# Patient Record
Sex: Female | Born: 1980
Health system: Southern US, Community
[De-identification: ages and names within clinical notes are randomized; demographics above are authoritative.]

## PROBLEM LIST (undated history)

## (undated) DIAGNOSIS — G43909 Migraine, unspecified, not intractable, without status migrainosus: Secondary | ICD-10-CM

## (undated) DIAGNOSIS — R87619 Unspecified abnormal cytological findings in specimens from cervix uteri: Secondary | ICD-10-CM

## (undated) DIAGNOSIS — R12 Heartburn: Secondary | ICD-10-CM

## (undated) HISTORY — DX: Migraine, unspecified, not intractable, without status migrainosus: G43.909

## (undated) HISTORY — DX: Heartburn: R12

## (undated) HISTORY — PX: TUBAL LIGATION: SHX77

## (undated) HISTORY — DX: Unspecified abnormal cytological findings in specimens from cervix uteri: R87.619

## (undated) HISTORY — PX: WISDOM TOOTH EXTRACTION: SHX21

---
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
## 2007-11-02 LAB — CONVERTED CEMR LAB

## 2008-05-06 ENCOUNTER — Ambulatory Visit: Payer: Self-pay | Admitting: Family Medicine

## 2008-05-06 DIAGNOSIS — G43009 Migraine without aura, not intractable, without status migrainosus: Secondary | ICD-10-CM | POA: Insufficient documentation

## 2008-05-09 ENCOUNTER — Ambulatory Visit: Payer: Self-pay | Admitting: Family Medicine

## 2008-05-12 ENCOUNTER — Telehealth: Payer: Self-pay | Admitting: Family Medicine

## 2008-06-07 ENCOUNTER — Ambulatory Visit: Payer: Self-pay | Admitting: Family Medicine

## 2008-06-13 ENCOUNTER — Encounter: Payer: Self-pay | Admitting: Family Medicine

## 2008-07-08 ENCOUNTER — Ambulatory Visit: Payer: Self-pay | Admitting: Family Medicine

## 2008-08-18 ENCOUNTER — Encounter: Payer: Self-pay | Admitting: Family Medicine

## 2008-08-25 ENCOUNTER — Telehealth: Payer: Self-pay | Admitting: Family Medicine

## 2008-09-19 ENCOUNTER — Encounter: Payer: Self-pay | Admitting: Family Medicine

## 2008-12-08 ENCOUNTER — Encounter: Payer: Self-pay | Admitting: Family Medicine

## 2009-05-02 ENCOUNTER — Ambulatory Visit: Payer: Self-pay | Admitting: Family Medicine

## 2009-06-08 ENCOUNTER — Encounter: Payer: Self-pay | Admitting: Family Medicine

## 2009-09-11 ENCOUNTER — Encounter: Payer: Self-pay | Admitting: Family Medicine

## 2009-09-12 ENCOUNTER — Ambulatory Visit: Payer: Self-pay | Admitting: Obstetrics & Gynecology

## 2009-09-12 ENCOUNTER — Encounter: Payer: Self-pay | Admitting: Obstetrics & Gynecology

## 2009-10-12 ENCOUNTER — Ambulatory Visit: Payer: Self-pay | Admitting: Family Medicine

## 2009-10-12 DIAGNOSIS — J309 Allergic rhinitis, unspecified: Secondary | ICD-10-CM | POA: Insufficient documentation

## 2009-11-21 ENCOUNTER — Ambulatory Visit: Payer: Self-pay | Admitting: Family Medicine

## 2009-11-21 DIAGNOSIS — H8110 Benign paroxysmal vertigo, unspecified ear: Secondary | ICD-10-CM | POA: Insufficient documentation

## 2009-12-05 ENCOUNTER — Encounter: Payer: Self-pay | Admitting: Family Medicine

## 2010-04-06 ENCOUNTER — Ambulatory Visit: Payer: Self-pay | Admitting: Family Medicine

## 2010-04-06 DIAGNOSIS — R635 Abnormal weight gain: Secondary | ICD-10-CM | POA: Insufficient documentation

## 2010-04-06 DIAGNOSIS — Z8719 Personal history of other diseases of the digestive system: Secondary | ICD-10-CM | POA: Insufficient documentation

## 2010-04-10 ENCOUNTER — Encounter: Payer: Self-pay | Admitting: Family Medicine

## 2010-04-11 LAB — CONVERTED CEMR LAB
ALT: 21 units/L (ref 0–35)
AST: 15 units/L (ref 0–37)
Albumin: 4.3 g/dL (ref 3.5–5.2)
BUN: 16 mg/dL (ref 6–23)
Bilirubin Urine: NEGATIVE
CO2: 22 meq/L (ref 19–32)
Calcium: 9.2 mg/dL (ref 8.4–10.5)
Chloride: 104 meq/L (ref 96–112)
Creatinine, Ser: 0.72 mg/dL (ref 0.40–1.20)
HCT: 41.8 % (ref 36.0–46.0)
Hemoglobin, Urine: NEGATIVE
Hemoglobin: 13.6 g/dL (ref 12.0–15.0)
Platelets: 310 10*3/uL (ref 150–400)
Potassium: 4.4 meq/L (ref 3.5–5.3)
Protein, ur: NEGATIVE mg/dL
RDW: 12.6 % (ref 11.5–15.5)
TSH: 1.308 microintl units/mL (ref 0.350–4.500)
Urine Glucose: NEGATIVE mg/dL
Urobilinogen, UA: 0.2 (ref 0.0–1.0)
WBC: 6 10*3/uL (ref 4.0–10.5)

## 2010-05-21 ENCOUNTER — Encounter: Payer: Self-pay | Admitting: Family Medicine

## 2010-05-24 ENCOUNTER — Encounter: Payer: Self-pay | Admitting: Family Medicine

## 2010-09-19 ENCOUNTER — Ambulatory Visit: Payer: Self-pay | Admitting: Obstetrics & Gynecology

## 2010-10-17 ENCOUNTER — Encounter: Payer: Self-pay | Admitting: Family Medicine

## 2010-12-13 ENCOUNTER — Ambulatory Visit
Admission: RE | Admit: 2010-12-13 | Discharge: 2010-12-13 | Payer: Self-pay | Source: Home / Self Care | Attending: Family Medicine | Admitting: Family Medicine

## 2010-12-13 ENCOUNTER — Encounter: Payer: Self-pay | Admitting: Family Medicine

## 2010-12-13 LAB — CONVERTED CEMR LAB
BUN: 10 mg/dL (ref 6–23)
CO2: 28 meq/L (ref 19–32)
Chloride: 104 meq/L (ref 96–112)
Glucose, Bld: 77 mg/dL (ref 70–99)
HCT: 42.3 % (ref 36.0–46.0)
Lymphs Abs: 2 10*3/uL (ref 0.7–4.0)
MCHC: 34.2 g/dL (ref 30.0–36.0)
MCV: 86.6 fL (ref 78.0–100.0)
Neutrophils Relative %: 66 % (ref 43–77)
Platelets: 273 10*3/uL (ref 150–400)
Potassium: 4.2 meq/L (ref 3.5–5.3)
RDW: 12.2 % (ref 11.5–15.5)
Sodium: 136 meq/L (ref 135–145)

## 2011-01-01 NOTE — Consult Note (Signed)
Summary: [facility]  [facility]   Imported By: Lanelle Bal 07/27/2010 12:13:41  _____________________________________________________________________  External Attachment:    Type:   Image     Comment:   External Document

## 2011-01-01 NOTE — Letter (Signed)
Summary: [facility]  [facility]   Imported By: Lanelle Bal 06/21/2010 09:52:50  _____________________________________________________________________  External Attachment:    Type:   Image     Comment:   External Document

## 2011-01-01 NOTE — Assessment & Plan Note (Signed)
Summary: WEIGHT GAIN AND GERD   Vital Signs:  Patient profile:   30 year old female Height:      65.5 inches Weight:      168 pounds Pulse rate:   91 / minute BP sitting:   110 / 66  (left arm) Cuff size:   regular  Vitals Entered By: Kathlene November (Apr 06, 2010 10:29 AM) CC: Abdomian bloating, weight gain   Primary Care Provider:  Nani Gasser MD  CC:  Abdomian bloating and weight gain.  History of Present Illness: Has gaine about 8 lbs in a realy short period.  Has been working out 5 days a week and hour each day.  Stomach feels uncomfrtable, heavy, gassy and bloated. Feels BM are irregular. Periods have been regular. No family hx of thyroid problems. No CP or SOB or swelling.  Has noticed her rings are tighter.  Has had more reflux lately. No new medications.Taking in 1300-1400 caloires for the last 6 weeks and has only lost 1/2 a lb.  NO fever. No fatigue.    Current Medications (verified): 1)  Yaz 3-0.02 Mg  Tabs (Drospirenone-Ethinyl Estradiol) .... Take One Tablet By Mouth Once A Day 2)  Treximet 85-500 Mg  Tabs (Sumatriptan-Naproxen Sodium) .Marland Kitchen.. 1 By Mouth Once Daily As Needed Migraines. Can Repeat Dose in 2 Hours If Still Have Ha 3)  Amitriptyline Hcl 25 Mg  Tabs (Amitriptyline Hcl) .... Take 1 Tablet By Mouth Once A Day At Bedtime 4)  Singulair 10 Mg Tabs (Montelukast Sodium) .... Take One Tablet By Mouth At Bedtime 5)  Allegra-D 24 Hour 180-240 Mg Xr24h-Tab (Fexofenadine-Pseudoephedrine) .... Take 1 Tablet By Mouth Once A Day in The Am. 6)  Fluticasone Propionate 50 Mcg/act Susp (Fluticasone Propionate) .... 2 Sprays in Each Nostrils Each Day  Allergies (verified): No Known Drug Allergies  Comments:  Nurse/Medical Assistant: The patient's medications and allergies were reviewed with the patient and were updated in the Medication and Allergy Lists. Kathlene November (Apr 06, 2010 10:29 AM)  Physical Exam  General:  Well-developed,well-nourished,in no acute distress;  alert,appropriate and cooperative throughout examination Head:  Normocephalic and atraumatic without obvious abnormalities. No apparent alopecia or balding. Eyes:  No corneal or conjunctival inflammation noted. EOMI. Perrla.  Neck:  No deformities, masses, or tenderness noted. No TM.  Lungs:  Normal respiratory effort, chest expands symmetrically. Lungs are clear to auscultation, no crackles or wheezes. Heart:  Normal rate and regular rhythm. S1 and S2 normal without gallop, murmur, click, rub or other extra sounds. Abdomen:  Bowel sounds positive,abdomen soft and non-tender without masses, organomegaly or hernias noted. Pulses:  Radial 2+ bilat  Extremities:  NO LE edema.  Skin:  no rashes.   Cervical Nodes:  No lymphadenopathy noted Psych:  Cognition and judgment appear intact. Alert and cooperative with normal attention span and concentration. No apparent delusions, illusions, hallucinations   Impression & Recommendations:  Problem # 1:  WEIGHT GAIN (ICD-783.1)  BAsed on her diet and exercise regimen I would really expect her to be able to lose weight and she has only lost hafl a pound.  I think we should check her thyroid gland. Her BP looks graet today. She has been on low dose amitryptiline for a long time so I really think this is a less likelhy cuase of her weight gain. Will screen for diabetes.  Her OCP shouldn't aldo be causing her sig weight gain either.  If all normal labs then will refer to nutrition  for counseling.   Orders: T-Comprehensive Metabolic Panel [id number]) T-TSH [id number]) T-CBC No Diff ([id number]) T-Urinalysis (81003-65000) T-Cortisol, AM ([id number])  Problem # 2:  GASTROESOPHAGEAL REFLUX DISEASE, MILD, HX OF (ICD-V12.79) Not sure if related to the caues of the weight gain or the weight gain itself is causing the GI sxs.  Samlies of PPI given for one week. If helping will tx for 6-8 weeks.   Complete Medication List: 1)  Yaz 3-0.02 Mg Tabs  (Drospirenone-ethinyl estradiol) .... Take one tablet by mouth once a day 2)  Treximet 85-500 Mg Tabs (Sumatriptan-naproxen sodium) .Marland Kitchen.. 1 by mouth once daily as needed migraines. can repeat dose in 2 hours if still have ha 3)  Amitriptyline Hcl 25 Mg Tabs (Amitriptyline hcl) .... Take 1 tablet by mouth once a day at bedtime 4)  Singulair 10 Mg Tabs (Montelukast sodium) .... Take one tablet by mouth at bedtime 5)  Allegra-d 24 Hour 180-240 Mg Xr24h-tab (Fexofenadine-pseudoephedrine) .... Take 1 tablet by mouth once a day in the am. 6)  Fluticasone Propionate 50 Mcg/act Susp (Fluticasone propionate) .... 2 sprays in each nostrils each day 7)  Nadolol 40 Mg Tabs (Nadolol) .... 2 at bedtime for migraine  Patient Instructions: 1)  We will call you with your lab results.  If all normal then I will refer you to a nutritionist and we could consider weaning off the amitryptiline.

## 2011-01-01 NOTE — Letter (Signed)
Summary: [facility]  [facility]   Imported By: Lanelle Bal 12/21/2009 11:37:01  _____________________________________________________________________  External Attachment:    Type:   Image     Comment:   External Document

## 2011-01-01 NOTE — Letter (Signed)
Summary: [facility]   Imported By: Maryln Gottron 11/08/2010 15:51:15  _____________________________________________________________________  External Attachment:    Type:   Image     Comment:   External Document

## 2011-01-03 NOTE — Assessment & Plan Note (Signed)
Summary: Vertigo   Vital Signs:  Patient profile:   30 year old female Height:      65.5 inches Weight:      180 pounds Pulse rate:   61 / minute BP sitting:   124 / 80  (right arm) Cuff size:   regular  Vitals Entered By: Avon Gully CMA, Duncan Dull) (December 13, 2010 10:16 AM) CC: vertigo   Primary Care Provider:  Nani Gasser MD  CC:  vertigo.  History of Present Illness: Flew at the end of Thanksgiign and started then. Feels a little off balance with movement.  Sometimes more intense and feels things are spinning and feels nauseated with it.  Last 2 days have been worse. Felt fatigued.  No fever.  No ear pain or pressure or hearing loss.  Had similar sxs when went on a cruise last year but only lasted 10 days. Stopped taking her OCPs in October.   This is on PNV.   Current Medications (verified): 1)  Treximet 85-500 Mg  Tabs (Sumatriptan-Naproxen Sodium) .Marland Kitchen.. 1 By Mouth Once Daily As Needed Migraines. Can Repeat Dose in 2 Hours If Still Have Ha 2)  Amitriptyline Hcl 25 Mg  Tabs (Amitriptyline Hcl) .... Take 1 Tablet By Mouth Once A Day At Bedtime 3)  Singulair 10 Mg Tabs (Montelukast Sodium) .... Take One Tablet By Mouth At Bedtime 4)  Allegra-D 24 Hour 180-240 Mg Xr24h-Tab (Fexofenadine-Pseudoephedrine) .... Take 1 Tablet By Mouth Once A Day in The Am. 5)  Fluticasone Propionate 50 Mcg/act Susp (Fluticasone Propionate) .... 2 Sprays in Each Nostrils Each Day 6)  Nadolol 40 Mg Tabs (Nadolol) .... 2 At Bedtime For Migraine  Allergies (verified): No Known Drug Allergies  Comments:  Nurse/Medical Assistant: The patient's medications and allergies were reviewed with the patient and were updated in the Medication and Allergy Lists. Avon Gully CMA, Duncan Dull) (December 13, 2010 10:17 AM)  Physical Exam  General:  Well-developed,well-nourished,in no acute distress; alert,appropriate and cooperative throughout examination Head:  Normocephalic and atraumatic without  obvious abnormalities. No apparent alopecia or balding. Eyes:  No corneal or conjunctival inflammation noted. EOMI. Perrla. Ears:  External ear exam shows no significant lesions or deformities.  Otoscopic examination reveals clear canals, tympanic membranes are intact bilaterally without bulging, retraction, inflammation or discharge. Hearing is grossly normal bilaterally. Mouth:  Tonsils with mild hypertrophy.  Neck:  No deformities, masses, or tenderness noted. Lungs:  Normal respiratory effort, chest expands symmetrically. Lungs are clear to auscultation, no crackles or wheezes. Heart:  Normal rate and regular rhythm. S1 and S2 normal without gallop, murmur, click, rub or other extra sounds. Neurologic:  alert & oriented X3, cranial nerves II-XII intact, and gait normal.  + diks hallpike to the left.  Skin:  no rashes.   Psych:  Cognition and judgment appear intact. Alert and cooperative with normal attention span and concentration. No apparent delusions, illusions, hallucinations   Impression & Recommendations:  Problem # 1:  BENIGN POSITIONAL VERTIGO (ICD-386.11)  Orders: T-CBC w/Diff ([id number]) T-Basic Metabolic Panel ([id number])  Demonstrated maneuvers to self-treat vertigo. Patient to call to be seen if no improvement in 10-14 days, sooner if worse. Given H.O on fruther infomraiton. Can use meclizine if would like.   Complete Medication List: 1)  Treximet 85-500 Mg Tabs (Sumatriptan-naproxen sodium) .Marland Kitchen.. 1 by mouth once daily as needed migraines. can repeat dose in 2 hours if still have ha 2)  Amitriptyline Hcl 25 Mg Tabs (Amitriptyline hcl) .... Take 1 tablet by mouth  once a day at bedtime 3)  Singulair 10 Mg Tabs (Montelukast sodium) .... Take one tablet by mouth at bedtime 4)  Allegra-d 24 Hour 180-240 Mg Xr24h-tab (Fexofenadine-pseudoephedrine) .... Take 1 tablet by mouth once a day in the am. 5)  Fluticasone Propionate 50 Mcg/act Susp (Fluticasone propionate) .... 2  sprays in each nostrils each day 6)  Nadolol 40 Mg Tabs (Nadolol) .... 2 at bedtime for migraine  Patient Instructions: 1)  Call if not better in 2-3 weeks. Can take the over the counter meclizine for dizziness if you want to.    Orders Added: 1)  T-CBC w/Diff [[id number]] 2)  T-Basic Metabolic Panel [[id number]] 3)  Est. Patient Level III [14782]

## 2011-04-16 NOTE — Assessment & Plan Note (Signed)
NAMEOSIRIS, CHARLES               ACCOUNT NO.:  [id number]   MEDICAL RECORD NO.:  [id number]          PATIENT TYPE:  POB   LOCATION:  [facility] [place]         FACILITY:  [facility]   PHYSICIAN:  Allie Bossier, MD        DATE OF BIRTH:  29-Dec-1980   DATE OF SERVICE:  09/19/2010                                  CLINIC NOTE   HISTORY:  Mallorey is a 30 year old married white, gravida 0, she comes in  here for annual exam.  She and her husband would like to start planning  a pregnancy.  In an effort to do this she has started taking prenatal  vitamins about 6 weeks ago.  She had some questions about Treximet use  during pregnancy.  I explained to her it is a category C and deemed  implications of that.  I told her I would be happy to write her  prescription for that if she needs it, if her neurologist prefers that I  write the prescriptions.  She plans to discontinue her birth control.   PAST MEDICAL HISTORY:  Menstrual migraines.   PAST SURGICAL HISTORY:  She had a LEEP in 2002, wisdom teeth extraction.   REVIEW OF SYSTEMS:  She is a homemaker.  She is married for about 3  years.  She denies dyspareunia.  She is status post Gardasil  vaccination.  She would like her flu shot this year today.   MEDICATIONS:  1. Singulair 10 mg daily.  2. Corgard 40 mg.  3. Cyclobenzaprine 10 mg.  4. Treximet p.r.n. (She only have to use this during menses).  5. Levora OCPs.  6. Claritin-D p.r.n.  7. Ibuprofen p.r.n. (rare use).  8. Prenatal vitamins daily.   ALLERGIES:  No known drug allergies.  No latex allergies.   FAMILY HISTORY:  Negative for breast, GYN, and colon malignancies.   PHYSICAL EXAMINATION:  GENERAL:  Well-nourished, well-hydrated white  female, height 5 feet 6 inches, weight 174, blood pressure 134/88, pulse  124.  HEENT:  Normal.  HEART:  Regular rate and rhythm.  LUNGS:  Clear to auscultation bilaterally.  BREASTS:  Normal bilaterally.  ABDOMEN:  Overweight.  No palpable  hepatosplenomegaly.  EXTERNAL GENITALIA:  No lesions.  Cervix nulliparous, normal, status  post LEEP, normal discharge.  Uterus normal size, shape,  anteverted,  mobile.  Adnexa nontender.  No masses.  She has adequate pelvis for a  normal size baby.   ASSESSMENT AND PLAN:  Annual exam, I checked Pap smear with cervical  cultures, given her flu vaccine, and I recommended self-breast and self-  vulvar exams.  She wishes to go ahead and stop her birth control pills.  I have explained at an average, the couple takes approximately a year to  achieve pregnancy.  She will come back when she is pregnant or in a  year.      Allie Bossier, MD     MCD/MEDQ  D:  09/19/2010  T:  09/19/2010  Job:  [id number]

## 2011-04-16 NOTE — Assessment & Plan Note (Signed)
Lisa, Cannon               ACCOUNT NO.:  [id number]   MEDICAL RECORD NO.:  [id number]          PATIENT TYPE:  POB   LOCATION:  [facility] [place]         FACILITY:  [facility]   PHYSICIAN:  Allie Bossier, MD        DATE OF BIRTH:  16-Apr-1981   DATE OF SERVICE:  09/12/2009                                  CLINIC NOTE   HISTORY OF PRESENT ILLNESS:  Lisa Cannon is a 30 year old married white  gravida 0.  She comes in here for her first annual exam at this office.  Her only complaint today is that of breakthrough bleeding on her generic  birth control pill called Levora; this is apparently the generic for  Seasonale and for 3 weeks preceding her inactive pills, she has  bleeding.   PAST MEDICAL HISTORY:  Migraines.   PAST SURGICAL HISTORY:  She had a LEEP in approximately the year 2002  and she has had a wisdom tooth extraction.  She had no problems with  general anesthesia for that procedure.   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  She denies dyspareunia.  Last Pap smear was in 2009  and was normal.  She is status post the Gardasil vaccination series in  2005 and her flu vaccination this month.   MEDICATIONS:  1. Singulair 10 mg daily.  2. Corgard 40 mg daily.  3. Cyclobenzaprine 10 mg daily.  4. Treximet as necessary.  5. Levora OCPs daily.  6. Claritin D p.r.n. as she barely takes an ibuprofen.   ALLERGIES:  No known drug allergies.  No latex allergies.   FAMILY HISTORY:  Negative for breast, GYN, and colon malignancies.   PHYSICAL EXAMINATION:  VITAL SIGNS:  Weight 164 pounds, height 5 feet 6  inches, blood pressure 119/71, pulse 86.  HEENT:  Normal.  HEART:  Regular rate and rhythm.  BREAST:  Normal bilaterally.  ABDOMEN:  Benign.  No palpable hepatosplenomegaly.  EXTERNAL GENITALIA:  Shaved.  No lesions.  Cervix nulliparous.  Normal  status post LEEP procedure.  Pap smear was obtained.  Bimanual exam,  uterus is  normal sized and shape, anteverted.  Adnexa is nontender.  No  masses.   ASSESSMENT AND PLAN:  1. Annual exam.  I have checked Pap smear.  Recommended self breast      and self vulvar exams monthly.  2. Breakthrough bleeding on Levora.  I have given her samples of Yas      birth control pills and told her to take them continuously.  Please      note that she does to continue with birth control pills because she      has worsening and more frequent migraines with her periods.  I have      given her a prescription as well.  She will call me if the Yas does      not suit her and if it does not suit her, I will call in another      birth control pill.      Allie Bossier, MD     MCD/MEDQ  D:  09/12/2009  T:  09/13/2009  Job:  [id number]

## 2011-05-10 ENCOUNTER — Encounter: Payer: Self-pay | Admitting: Family Medicine

## 2011-06-17 ENCOUNTER — Other Ambulatory Visit: Payer: Self-pay | Admitting: Family Medicine

## 2011-08-28 ENCOUNTER — Encounter: Payer: Self-pay | Admitting: Obstetrics & Gynecology

## 2011-08-28 ENCOUNTER — Ambulatory Visit (INDEPENDENT_AMBULATORY_CARE_PROVIDER_SITE_OTHER): Payer: 59 | Admitting: Obstetrics & Gynecology

## 2011-08-28 VITALS — BP 120/78 | HR 81 | Temp 98.3°F | Resp 16 | Ht 66.0 in | Wt 186.0 lb

## 2011-08-28 DIAGNOSIS — E348 Other specified endocrine disorders: Secondary | ICD-10-CM

## 2011-08-28 DIAGNOSIS — E669 Obesity, unspecified: Secondary | ICD-10-CM

## 2011-08-28 DIAGNOSIS — N912 Amenorrhea, unspecified: Secondary | ICD-10-CM

## 2011-08-28 NOTE — Progress Notes (Signed)
  Subjective:    Patient ID: Lisa Cannon, female    DOB: 1981-04-20, 30 y.o.   MRN: [id number]  HPI  Lisa Cannon is a 30 yo MWG0 who has been off her OCP for 1 year, trying to get pregnant.  She had monthly periods (3 days and light) for 8 months and then no period for last 3 months. Home UPTs and one here today are negative.  She has gained 12 pounds since last year without changing her lifestyle. She complains of new onset facial hair growth and darkening and coarsening of hair on her legs.  Review of Systems TSH was normal 5/11 Pap due next month   Denies hot flashes Objective:   Physical Exam        Assessment & Plan:  Amenorrhea and weight gain Check fasting lipids, TSH, prolactin, FSH

## 2011-08-29 LAB — TESTOSTERONE, FREE, TOTAL, SHBG
Testosterone, Free: 21.1 pg/mL — ABNORMAL HIGH (ref 0.6–6.8)
Testosterone-% Free: 2.5 % — ABNORMAL HIGH (ref 0.4–2.4)
Testosterone: 84.76 ng/dL — ABNORMAL HIGH (ref 10–70)

## 2011-08-29 LAB — TSH: TSH: 1.561 u[IU]/mL (ref 0.350–4.500)

## 2011-08-29 LAB — LIPID PANEL
HDL: 40 mg/dL (ref >39)
LDL Cholesterol: 123 mg/dL — ABNORMAL HIGH (ref 0–99)

## 2011-08-29 LAB — FOLLICLE STIMULATING HORMONE: FSH: 5.4 m[IU]/mL

## 2011-08-29 LAB — PROLACTIN: Prolactin: 8.7 ng/mL

## 2011-09-17 ENCOUNTER — Ambulatory Visit (INDEPENDENT_AMBULATORY_CARE_PROVIDER_SITE_OTHER): Payer: 59 | Admitting: Obstetrics & Gynecology

## 2011-09-17 ENCOUNTER — Encounter: Payer: Self-pay | Admitting: Obstetrics & Gynecology

## 2011-09-17 VITALS — BP 109/72 | HR 74 | Wt 186.0 lb

## 2011-09-17 DIAGNOSIS — N970 Female infertility associated with anovulation: Secondary | ICD-10-CM

## 2011-09-17 DIAGNOSIS — Z1272 Encounter for screening for malignant neoplasm of vagina: Secondary | ICD-10-CM

## 2011-09-17 DIAGNOSIS — Z01419 Encounter for gynecological examination (general) (routine) without abnormal findings: Secondary | ICD-10-CM

## 2011-09-17 DIAGNOSIS — E282 Polycystic ovarian syndrome: Secondary | ICD-10-CM

## 2011-09-17 DIAGNOSIS — Z23 Encounter for immunization: Secondary | ICD-10-CM

## 2011-09-17 DIAGNOSIS — Z113 Encounter for screening for infections with a predominantly sexual mode of transmission: Secondary | ICD-10-CM

## 2011-09-17 DIAGNOSIS — Z0000 Encounter for general adult medical examination without abnormal findings: Secondary | ICD-10-CM

## 2011-09-17 MED ORDER — CLOMIPHENE CITRATE 50 MG PO TABS: ORAL_TABLET | ORAL | Status: DC

## 2011-09-17 MED ORDER — MEDROXYPROGESTERONE ACETATE 10 MG PO TABS: ORAL_TABLET | ORAL | Status: DC

## 2011-09-17 NOTE — Progress Notes (Signed)
  Subjective:    Patient ID: Lisa Cannon, female    DOB: [birth year], 30 y.o.   MRN: [id number]  HPI    Review of Systems     Objective:   Physical Exam        Assessment & Plan:   Subjective:    Lisa Cannon is a 30 y.o. female who presents for an annual exam. She is also here to discuss her labs from last month.She is trying to conceive, unprotected sex for 1 year. She has not had a period since June. The patient is sexually active. GYN screening history: last pap: was normal. The patient wears seatbelts: yes. The patient participates in regular exercise: no. Has the patient ever been transfused or tattooed?: yes. (tattoo)The patient reports that there is not domestic violence in her life.   Menstrual History: OB History    Grav Para Term Preterm Abortions TAB SAB Ect Mult Living   0 0 0 0 0 0 0 0 0 0       Menarche age: 17 Patient's last menstrual period was 05/03/2011.    The following portions of the patient's history were reviewed and updated as appropriate: allergies, current medications, past medical history, past social history, past surgical history and problem list.  Review of Systems A comprehensive review of systems was negative.    Objective:    BP 109/72  Pulse 74  Wt 186 lb (84.369 kg)  LMP 05/03/2011  General Appearance:    Alert, cooperative, no distress, appears stated age  Head:    Normocephalic, without obvious abnormality, atraumatic  Eyes:    PERRL, conjunctiva/corneas clear, EOM's intact, fundi    benign, both eyes  Ears:    Normal TM's and external ear canals, both ears  Nose:   Nares normal, septum midline, mucosa normal, no drainage    or sinus tenderness  Throat:   Lips, mucosa, and tongue normal; teeth and gums normal  Neck:   Supple, symmetrical, trachea midline, no adenopathy;    thyroid:  no enlargement/tenderness/nodules; no carotid   bruit or JVD, hirsuitism  Back:     Symmetric, no curvature, ROM normal, no CVA tenderness    Lungs:     Clear to auscultation bilaterally, respirations unlabored  Chest Wall:    No tenderness or deformity   Heart:    Regular rate and rhythm, S1 and S2 normal, no murmur, rub   or gallop  Breast Exam:    No tenderness, masses, or nipple abnormality  Abdomen:     Soft, non-tender, bowel sounds active all four quadrants,    no masses, no organomegaly  Genitalia:    Normal female without lesion, discharge or tenderness, NSSRV, NT, mobile  Rectal:     Extremities:   Extremities normal, atraumatic, no cyanosis or edema  Pulses:   2+ and symmetric all extremities  Skin:   Skin color, texture, turgor normal, no rashes or lesions  Lymph nodes:   Cervical, supraclavicular, and axillary nodes normal  Neurologic:   CNII-XII intact, normal strength, sensation and reflexes    throughout  .    Assessment:    Healthy female exam.  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  Start Provera and clomid Rec weight loss  Semen analysis

## 2011-09-23 ENCOUNTER — Encounter: Payer: Self-pay | Admitting: Family Medicine

## 2011-09-23 ENCOUNTER — Ambulatory Visit (INDEPENDENT_AMBULATORY_CARE_PROVIDER_SITE_OTHER): Payer: 59 | Admitting: Family Medicine

## 2011-09-23 VITALS — BP 109/67 | HR 69 | Wt 185.0 lb

## 2011-09-23 DIAGNOSIS — R42 Dizziness and giddiness: Secondary | ICD-10-CM

## 2011-09-23 MED ORDER — SUMATRIPTAN SUCCINATE 100 MG PO TABS: 100.0000 mg | ORAL_TABLET | ORAL | Status: DC | PRN

## 2011-09-23 NOTE — Patient Instructions (Signed)
Will call you later this week and let you know the next step for workup.

## 2011-09-23 NOTE — Progress Notes (Signed)
  Subjective:    Patient ID: Lisa Cannon, female    DOB: 1981/01/04, 30 y.o.   MRN: [id number]  HPI Has had periods of dizziness for the last 2 years.  her most recent episode started last Thursday and started feeling a little better yesterday. Now when gets vertigo, it will last a week. Says can't move around for about 4-5 days without getting sick. A few times woke up with it.  Happening more frequently.  Says if sometimes when gets am igaine and takes her med this will trigger her vertigo. No fever, ear pain or pressure. Feels like on a "boat".  Vertigo is horizontal. Getting episodes about every 1-2 months. Started originally about 2 years ago after went on a cruise. Has been doing her eply manuvers and not helping. Mat GF with persistant dizziness.  No hearing loss or changes.   Review of Systems     Objective:   Physical Exam  Constitutional: She is oriented to person, place, and time. She appears well-developed and well-nourished.  HENT:  Head: Normocephalic and atraumatic.  Right Ear: External ear normal.  Left Ear: External ear normal.  Nose: Nose normal.  Mouth/Throat: Oropharynx is clear and moist.       TMs and canals are clear.   Eyes: Conjunctivae and EOM are normal. Pupils are equal, round, and reactive to light.  Neck: Neck supple. No thyromegaly present.  Cardiovascular: Normal rate, regular rhythm and normal heart sounds.   Pulmonary/Chest: Effort normal and breath sounds normal. She has no wheezes.  Lymphadenopathy:    She has no cervical adenopathy.  Neurological: She is alert and oriented to person, place, and time. She displays normal reflexes. No cranial nerve deficit. She exhibits normal muscle tone. Coordination normal.  Skin: Skin is warm and dry.  Psychiatric: She has a normal mood and affect. Her behavior is normal.          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 She has no other apparent neurologic symptoms. One week  consider physical therapy for her vertigo. Or possibly consider MRI for further evaluation. There are consider referral to neurology for further evaluation. At this point time I would recommend referring her to neurology for further evaluation.

## 2011-09-25 ENCOUNTER — Telehealth: Payer: Self-pay | Admitting: Family Medicine

## 2011-09-25 NOTE — Telephone Encounter (Signed)
Please call and let her know that after reading a little bit further about chronic vertigo I think he would be best to go ahead and see neurology if they can do some further diagnostic testing that I cannot. Initiate stain fail neurology in the past I believe for her migraines. If she would like to go back there we can schedule it there were we can schedule with [facility] neurologic here in [place] with Dr. Aggie Cosier.

## 2011-09-25 NOTE — Telephone Encounter (Signed)
LMOM with dr advise 

## 2011-09-26 ENCOUNTER — Telehealth: Payer: Self-pay | Admitting: Family Medicine

## 2011-09-26 NOTE — Telephone Encounter (Signed)
Pt called and said she already sees a neurologist for migraines (Dr. Isabell Jarvis) [facility] and prefers to be referred to him for the vertigo. Plan:  Routed this message to Jennifer/referrals since the order is already in the system for the referral. Jarvis Newcomer, LPN Domingo Dimes

## 2011-12-02 ENCOUNTER — Other Ambulatory Visit: Payer: Self-pay | Admitting: Family Medicine

## 2011-12-03 NOTE — L&D Delivery Note (Signed)
Delivery Note At 5:51 PM a viable female was delivered via Vaginal, Vacuum (Extractor) (Presentation:Left Occiput Anterior) by Dr. Jolayne Panther.  Nuchal cord x 1, reduced before body birthed. APGAR: 8, 9; weight 6 lb 8.4 oz (2960 g).   Placenta status: intact and delivered without complications.  Cord: 3 vessels with the following complications: None.   NICU called to be in attendance of birth d/t prolonged decel to 7s after arom, and vacuum birth.  Anesthesia: Local  Episiotomy: n/a Lacerations: 2nd degree perineal repaired by Dr. Jolayne Panther after local infiltration with 1% Lidocaine Suture Repair: 2.0 vicryl Est. Blood Loss (mL):  Mom to postpartum.  Baby to nursery-stable.  Plans to breastfeed, desires Micronor.  Marge Duncans 08/15/2012, 6:09 PM

## 2012-01-14 ENCOUNTER — Ambulatory Visit (INDEPENDENT_AMBULATORY_CARE_PROVIDER_SITE_OTHER): Payer: 59 | Admitting: *Deleted

## 2012-01-14 VITALS — BP 117/71 | Temp 98.6°F | Wt 184.0 lb

## 2012-01-14 DIAGNOSIS — Z3480 Encounter for supervision of other normal pregnancy, unspecified trimester: Secondary | ICD-10-CM

## 2012-01-14 NOTE — Progress Notes (Signed)
p-88  Pt is here today with spouse for NOB intake.  They are very excited as this was a planned pregnancy.  She is G1 P0  Her husband works for [employer].  U/S today showed IUP with CRL 19.27mm and FHT 171bpm.  PN labs drawn today including CF screening and GC/Chlamydia done on urine.   She is not due for a pap @ this time.  She is to return in 2 weeks to see Dr Marice Potter.

## 2012-01-15 LAB — HIV ANTIBODY (ROUTINE TESTING W REFLEX): HIV: NONREACTIVE

## 2012-01-15 LAB — OBSTETRIC PANEL
Antibody Screen: NEGATIVE
Basophils Absolute: 0 10*3/uL (ref 0.0–0.1)
Eosinophils Relative: 0 % (ref 0–5)
HCT: 41.8 % (ref 36.0–46.0)
Lymphocytes Relative: 23 % (ref 12–46)
MCH: 29.7 pg (ref 26.0–34.0)
MCV: 88.2 fL (ref 78.0–100.0)
Monocytes Absolute: 0.7 10*3/uL (ref 0.1–1.0)
RDW: 12.9 % (ref 11.5–15.5)
Rubella: 202.6 IU/mL — ABNORMAL HIGH
WBC: 9.8 10*3/uL (ref 4.0–10.5)

## 2012-01-18 LAB — CULTURE, URINE COMPREHENSIVE

## 2012-01-20 LAB — CYSTIC FIBROSIS DIAGNOSTIC STUDY

## 2012-01-22 ENCOUNTER — Ambulatory Visit (INDEPENDENT_AMBULATORY_CARE_PROVIDER_SITE_OTHER): Payer: 59 | Admitting: Obstetrics & Gynecology

## 2012-01-22 VITALS — BP 109/64 | Temp 97.3°F | Wt 185.0 lb

## 2012-01-22 DIAGNOSIS — Z3490 Encounter for supervision of normal pregnancy, unspecified, unspecified trimester: Secondary | ICD-10-CM

## 2012-01-22 DIAGNOSIS — Z3480 Encounter for supervision of other normal pregnancy, unspecified trimester: Secondary | ICD-10-CM

## 2012-01-22 NOTE — Progress Notes (Signed)
Lisa Cannon is here for her NOB visit. 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 She is very happy and excited. 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 She has no complaints. We discussed first trimester screen, and she declines it. She would like an anatomy scan and MSAFP at 18 weeks. Her pap smear was normal 10/12 and her cultures were recently normal. Her labs were also normal.  Physical exam: Heart- rrr Lungs-CTAB Abd- benign Pelvic- excellent pelvis, 10 week size uterus U/S- FHR-150s, normal fluid  A/P. NOB- we discussed a recommended weight gain of less than 30 pounds.

## 2012-01-22 NOTE — Progress Notes (Signed)
p-83  Last pap 10/12 WNL  GC Chlamydia done off urine at last visit

## 2012-02-07 ENCOUNTER — Ambulatory Visit (INDEPENDENT_AMBULATORY_CARE_PROVIDER_SITE_OTHER): Payer: 59 | Admitting: Physician Assistant

## 2012-02-07 VITALS — BP 118/69 | Temp 97.8°F | Wt 183.0 lb

## 2012-02-07 DIAGNOSIS — Z3480 Encounter for supervision of other normal pregnancy, unspecified trimester: Secondary | ICD-10-CM

## 2012-02-07 DIAGNOSIS — J069 Acute upper respiratory infection, unspecified: Secondary | ICD-10-CM

## 2012-02-07 MED ORDER — BENZONATATE 100 MG PO CAPS: 100.0000 mg | ORAL_CAPSULE | Freq: Three times a day (TID) | ORAL | Status: DC | PRN

## 2012-02-07 NOTE — Progress Notes (Signed)
p-95  Blowing yellow mucous and sometimes bloody  Throat irritated due to cough

## 2012-02-07 NOTE — Progress Notes (Signed)
4 day hx of non productive cough, congestion, & scratchy throat. +exposure from husband. Denies fever, chills, GI s/s, ear pain, or HA. Lung: CTAB, HEENT: nasal congestion, erathymatous throat; no exudate/lesions. BL TM w/o s/s infection. Dx: Viral URI. Will start tessalon perrles for cough. Continue Mucinex BID and anti-hystamine. Call office with fever or chills. FU as next regularly scheduled OB visit.

## 2012-02-07 NOTE — Patient Instructions (Signed)

## 2012-02-21 ENCOUNTER — Ambulatory Visit (INDEPENDENT_AMBULATORY_CARE_PROVIDER_SITE_OTHER): Payer: 59 | Admitting: Obstetrics and Gynecology

## 2012-02-21 VITALS — BP 109/60 | Temp 97.1°F | Wt 186.0 lb

## 2012-02-21 DIAGNOSIS — O0990 Supervision of high risk pregnancy, unspecified, unspecified trimester: Secondary | ICD-10-CM | POA: Insufficient documentation

## 2012-02-21 DIAGNOSIS — Z3400 Encounter for supervision of normal first pregnancy, unspecified trimester: Secondary | ICD-10-CM

## 2012-02-21 NOTE — Progress Notes (Signed)
p-81 Doing well. Anatomic scan scheduled. Discussed FOB being 6'7" so expecting constitutionally big baby. Quad screen next visit.

## 2012-02-24 NOTE — Progress Notes (Signed)
Addended by: Granville Lewis on: 02/24/2012 01:45 PM   Modules accepted: Orders

## 2012-03-16 ENCOUNTER — Ambulatory Visit (HOSPITAL_COMMUNITY)
Admission: RE | Admit: 2012-03-16 | Discharge: 2012-03-16 | Disposition: A | Discharge status: Home / Self Care | Payer: 59 | Source: Ambulatory Visit | Attending: Obstetrics and Gynecology | Admitting: Obstetrics and Gynecology

## 2012-03-16 DIAGNOSIS — O358XX0 Maternal care for other (suspected) fetal abnormality and damage, not applicable or unspecified: Secondary | ICD-10-CM | POA: Insufficient documentation

## 2012-03-16 DIAGNOSIS — Z363 Encounter for antenatal screening for malformations: Secondary | ICD-10-CM | POA: Insufficient documentation

## 2012-03-16 DIAGNOSIS — Z1389 Encounter for screening for other disorder: Secondary | ICD-10-CM | POA: Insufficient documentation

## 2012-03-16 DIAGNOSIS — Z3400 Encounter for supervision of normal first pregnancy, unspecified trimester: Secondary | ICD-10-CM

## 2012-03-20 ENCOUNTER — Ambulatory Visit (INDEPENDENT_AMBULATORY_CARE_PROVIDER_SITE_OTHER): Payer: 59 | Admitting: Family

## 2012-03-20 VITALS — BP 126/65 | Temp 98.4°F | Wt 185.0 lb

## 2012-03-20 DIAGNOSIS — O2340 Unspecified infection of urinary tract in pregnancy, unspecified trimester: Secondary | ICD-10-CM

## 2012-03-20 DIAGNOSIS — Z23 Encounter for immunization: Secondary | ICD-10-CM

## 2012-03-20 DIAGNOSIS — N390 Urinary tract infection, site not specified: Secondary | ICD-10-CM

## 2012-03-20 DIAGNOSIS — O2390 Unspecified genitourinary tract infection in pregnancy, unspecified trimester: Secondary | ICD-10-CM

## 2012-03-20 DIAGNOSIS — Z3400 Encounter for supervision of normal first pregnancy, unspecified trimester: Secondary | ICD-10-CM

## 2012-03-20 MED ORDER — TETANUS-DIPHTH-ACELL PERTUSSIS 5-2.5-18.5 LF-MCG/0.5 IM SUSP: 0.5000 mL | Freq: Once | INTRAMUSCULAR | Status: DC

## 2012-03-20 MED ORDER — CEPHALEXIN 500 MG PO CAPS: 500.0000 mg | ORAL_CAPSULE | Freq: Two times a day (BID) | ORAL | Status: AC

## 2012-03-20 NOTE — Progress Notes (Signed)
p 

## 2012-03-20 NOTE — Progress Notes (Signed)
Reviewed ultrasound results; no abnormalities found (poor visualization of RVOT)>rescan later preg; Quad screen drawn today; reviewed OB schedule and purpose of checking fundal height and fetal heart tones; reviewed urine from 2/13, +e. Coli; pt asymptomatic>RX Keflex BID x 7 days, TOC at next visit.

## 2012-03-20 NOTE — Progress Notes (Signed)
Addended by: Granville Lewis on: 03/20/2012 10:14 AM   Modules accepted: Orders

## 2012-04-01 ENCOUNTER — Encounter: Payer: Self-pay | Admitting: Obstetrics & Gynecology

## 2012-04-02 ENCOUNTER — Encounter: Payer: Self-pay | Admitting: Obstetrics & Gynecology

## 2012-04-17 ENCOUNTER — Ambulatory Visit (INDEPENDENT_AMBULATORY_CARE_PROVIDER_SITE_OTHER): Payer: 59 | Admitting: Family

## 2012-04-17 VITALS — BP 104/61 | Temp 98.6°F | Wt 192.0 lb

## 2012-04-17 DIAGNOSIS — Z3400 Encounter for supervision of normal first pregnancy, unspecified trimester: Secondary | ICD-10-CM

## 2012-04-17 NOTE — Progress Notes (Signed)
p-82  TOC of urine today.

## 2012-04-17 NOTE — Progress Notes (Signed)
Reports feeling good; treated for her birthday last week; finished antibiotics for UTI, no symptoms, TOC today.  Reviewed 1 hr GTT for next visit; reschedule ultrasound for anatomy

## 2012-04-19 LAB — CULTURE, OB URINE: Organism ID, Bacteria: NO GROWTH

## 2012-04-28 ENCOUNTER — Ambulatory Visit (HOSPITAL_COMMUNITY): Payer: 59

## 2012-04-28 ENCOUNTER — Ambulatory Visit (HOSPITAL_COMMUNITY)
Admission: RE | Admit: 2012-04-28 | Discharge: 2012-04-28 | Disposition: A | Discharge status: Home / Self Care | Payer: 59 | Source: Ambulatory Visit | Attending: Family | Admitting: Family

## 2012-04-28 DIAGNOSIS — Z3400 Encounter for supervision of normal first pregnancy, unspecified trimester: Secondary | ICD-10-CM

## 2012-04-28 DIAGNOSIS — Z3689 Encounter for other specified antenatal screening: Secondary | ICD-10-CM | POA: Insufficient documentation

## 2012-05-01 ENCOUNTER — Encounter: Payer: Self-pay | Admitting: Family

## 2012-05-15 ENCOUNTER — Ambulatory Visit (INDEPENDENT_AMBULATORY_CARE_PROVIDER_SITE_OTHER): Payer: 59 | Admitting: Family

## 2012-05-15 VITALS — BP 112/64 | Temp 98.5°F | Wt 194.0 lb

## 2012-05-15 DIAGNOSIS — Z3400 Encounter for supervision of normal first pregnancy, unspecified trimester: Secondary | ICD-10-CM

## 2012-05-15 DIAGNOSIS — O2340 Unspecified infection of urinary tract in pregnancy, unspecified trimester: Secondary | ICD-10-CM

## 2012-05-15 DIAGNOSIS — N390 Urinary tract infection, site not specified: Secondary | ICD-10-CM

## 2012-05-15 DIAGNOSIS — O2390 Unspecified genitourinary tract infection in pregnancy, unspecified trimester: Secondary | ICD-10-CM

## 2012-05-15 LAB — CBC
Platelets: 213 10*3/uL (ref 150–400)
RBC: 4.34 MIL/uL (ref 3.87–5.11)
WBC: 11.3 10*3/uL — ABNORMAL HIGH (ref 4.0–10.5)

## 2012-05-15 NOTE — Addendum Note (Signed)
Addended by: Granville Lewis on: 05/15/2012 08:52 AM   Modules accepted: Orders

## 2012-05-15 NOTE — Progress Notes (Signed)
p-88 

## 2012-05-15 NOTE — Progress Notes (Signed)
Reviewed ultrasound results and urine test of cure (negative); no questions or concerns, started childbirth classes last night; 1 hr GCT today

## 2012-05-16 LAB — GLUCOSE TOLERANCE, 1 HOUR: Glucose, 1 Hour GTT: 156 mg/dL — ABNORMAL HIGH (ref 70–140)

## 2012-05-17 LAB — URINE CULTURE: Colony Count: 15000

## 2012-05-18 ENCOUNTER — Telehealth: Payer: Self-pay | Admitting: *Deleted

## 2012-05-18 DIAGNOSIS — O99810 Abnormal glucose complicating pregnancy: Secondary | ICD-10-CM

## 2012-05-18 NOTE — Telephone Encounter (Signed)
Pt notified of abn 1 hr GTT she is now scheduled for a 3 hr GTT on Friday.

## 2012-05-21 ENCOUNTER — Encounter: Payer: Self-pay | Admitting: Family

## 2012-05-23 LAB — GLUCOSE TOLERANCE, 3 HOURS
Glucose Tolerance, 1 hour: 215 mg/dL — ABNORMAL HIGH (ref 70–189)
Glucose Tolerance, 2 hour: 212 mg/dL — ABNORMAL HIGH (ref 70–164)
Glucose Tolerance, Fasting: 82 mg/dL (ref 70–104)
Glucose, GTT - 3 Hour: 119 mg/dL (ref 70–144)

## 2012-05-25 ENCOUNTER — Telehealth: Payer: Self-pay | Admitting: *Deleted

## 2012-05-25 DIAGNOSIS — O24419 Gestational diabetes mellitus in pregnancy, unspecified control: Secondary | ICD-10-CM

## 2012-05-25 NOTE — Telephone Encounter (Signed)
Pt has 2 elevated levels on 3 hr GTT.  Referral to [facility]  LM on pt's home phone to call office.

## 2012-05-27 ENCOUNTER — Encounter: Payer: Self-pay | Admitting: *Deleted

## 2012-05-27 ENCOUNTER — Encounter: Payer: 59 | Attending: Obstetrics & Gynecology | Admitting: *Deleted

## 2012-05-27 DIAGNOSIS — Z713 Dietary counseling and surveillance: Secondary | ICD-10-CM | POA: Insufficient documentation

## 2012-05-27 DIAGNOSIS — O99810 Abnormal glucose complicating pregnancy: Secondary | ICD-10-CM | POA: Insufficient documentation

## 2012-05-27 NOTE — Progress Notes (Signed)
  Patient was seen on 05/27/2012 for Gestational Diabetes self-management class at [facility]. The following learning objectives were met by the patient during this course:   States the definition of Gestational Diabetes  States why dietary management is important in controlling blood glucose  Describes the effects each nutrient has on blood glucose levels  Demonstrates ability to create a balanced meal plan  Demonstrates carbohydrate counting   States when to check blood glucose levels  Demonstrates proper blood glucose monitoring techniques  States the effect of stress and exercise on blood glucose levels  States the importance of limiting caffeine and abstaining from alcohol and smoking  Blood glucose monitor given: Accu Chek Nano BG Monitoring Kit Lot # [id number] Exp: 08/31/2013 Blood glucose reading: 135 mg/dl (1 hour after eating)  Patient instructed to monitor glucose levels: FBS: 60 - <90 2 hour: <120  *Patient received handouts:  Nutrition Diabetes and Pregnancy  Carbohydrate Counting List  Patient will be seen for follow-up as needed.

## 2012-05-27 NOTE — Patient Instructions (Signed)
Goals:  Check glucose levels per MD as instructed  Follow Gestational Diabetes Diet as instructed  Call for follow-up as needed    

## 2012-05-29 ENCOUNTER — Encounter: Payer: Self-pay | Admitting: Advanced Practice Midwife

## 2012-05-29 ENCOUNTER — Ambulatory Visit (INDEPENDENT_AMBULATORY_CARE_PROVIDER_SITE_OTHER): Payer: 59 | Admitting: Advanced Practice Midwife

## 2012-05-29 VITALS — BP 104/64 | Temp 98.6°F | Wt 197.0 lb

## 2012-05-29 DIAGNOSIS — O99810 Abnormal glucose complicating pregnancy: Secondary | ICD-10-CM

## 2012-05-29 DIAGNOSIS — Z3480 Encounter for supervision of other normal pregnancy, unspecified trimester: Secondary | ICD-10-CM

## 2012-05-29 DIAGNOSIS — O24419 Gestational diabetes mellitus in pregnancy, unspecified control: Secondary | ICD-10-CM | POA: Insufficient documentation

## 2012-05-29 MED ORDER — ACCU-CHEK FASTCLIX LANCETS MISC: 1.0000 | Freq: Four times a day (QID) | Status: DC

## 2012-05-29 NOTE — Progress Notes (Signed)
fbs yest 91 B=97   L=151 D=123    Just did class yesterday.  Had blackey peas and roll withmeat and veg.Marland KitchenMarland KitchenDiscussed complex and simple carbs. Will bring back in 1 week to check progress, then if doing well, q 2 wks..  Reviewed PTL and FM.Marland Kitchen

## 2012-05-29 NOTE — Progress Notes (Signed)
p-90  Needs RX for lancets and testing strips

## 2012-05-29 NOTE — Patient Instructions (Signed)
Gestational Diabetes Mellitus Gestational diabetes mellitus (GDM) is diabetes that occurs only during pregnancy. This happens when the body cannot properly handle the glucose (sugar) that increases in the blood after eating. During pregnancy, insulin resistance (reduced sensitivity to insulin) occurs because of the release of hormones from the placenta. Usually, the pancreas of pregnant women produces enough insulin to overcome the resistance that occurs. However, in gestational diabetes, the insulin is there but it does not work effectively. If the resistance is severe enough that the pancreas does not produce enough insulin, extra glucose builds up in the blood.  WHO IS AT RISK FOR DEVELOPING GESTATIONAL DIABETES?  Women with a history of diabetes in the family.   Women over age 25.   Women who are overweight.   Women in certain ethnic groups (Hispanic, African American, Native American, Asian and Pacific Islander).  WHAT CAN HAPPEN TO THE BABY? If the mother's blood glucose is too high while she is pregnant, the extra sugar will travel through the umbilical cord to the baby. Some of the problems the baby may have are:  Large Baby - If the baby receives too much sugar, the baby will gain more weight. This may cause the baby to be too large to be born normally (vaginally) and a Cesarean section (C-section) may be needed.   Low Blood Glucose (hypoglycemia) - The baby makes extra insulin, in response to the extra sugar its gets from its mother. When the baby is born and no longer needs this extra insulin, the baby's blood glucose level may drop.   Jaundice (yellow coloring of the skin and eyes) - This is fairly common in babies. It is caused from a build-up of the chemical called bilirubin. This is rarely serious, but is seen more often in babies whose mothers had gestational diabetes.  RISKS TO THE MOTHER Women who have had gestational diabetes may be at higher risk for some problems,  including:  Preeclampsia or toxemia, which includes problems with high blood pressure. Blood pressure and protein levels in the urine must be checked frequently.   Infections.   Cesarean section (C-section) for delivery.   Developing Type 2 diabetes later in life. About 30-50% will develop diabetes later, especially if obese.  DIAGNOSIS  The hormones that cause insulin resistance are highest at about 24-28 weeks of pregnancy. If symptoms are experienced, they are much like symptoms you would normally expect during pregnancy.  GDM is often diagnosed using a two part method: 1. After 24-28 weeks of pregnancy, the woman drinks a glucose solution and takes a blood test. If the glucose level is high, a second test will be given.  2. Oral Glucose Tolerance Test (OGTT) which is 3 hours long - After not eating overnight, the blood glucose is checked. The woman drinks a glucose solution, and hourly blood glucose tests are taken.  If the woman has risk factors for GDM, the caregiver may test earlier than 24 weeks of pregnancy. TREATMENT  Treatment of GDM is directed at keeping the mother's blood glucose level normal, and may include:  Meal planning.   Taking insulin or other medicine to control your blood glucose level.   Exercise.   Keeping a daily record of the foods you eat.   Blood glucose monitoring and keeping a record of your blood glucose levels.   May monitor ketone levels in the urine, although this is no longer considered necessary in most pregnancies.  HOME CARE INSTRUCTIONS  While you are pregnant:    Follow your caregiver's advice regarding your prenatal appointments, meal planning, exercise, medicines, vitamins, blood and other tests, and physical activities.   Keep a record of your meals, blood glucose tests, and the amount of insulin you are taking (if any). Show this to your caregiver at every prenatal visit.   If you have GDM, you may have problems with hypoglycemia (low  blood glucose). You may suspect this if you become suddenly dizzy, feel shaky, and/or weak. If you think this is happening and you have a glucose meter, try to test your blood glucose level. Follow your caregiver's advice for when and how to treat your low blood glucose. Generally, the 15:15 rule is followed: Treat by consuming 15 grams of carbohydrates, wait 15 minutes, and recheck blood glucose. Examples of 15 grams of carbohydrates are:   1 cup skim or low-fat milk.    cup juice.   3-4 glucose tablets.   5-6 hard candies.   1 small box raisins.    cup regular soda pop.   Practice good hygiene, to avoid infections.   Do not smoke.  SEEK MEDICAL CARE IF:   You develop abnormal vaginal discharge, with or without itching.   You become weak and tired more than expected.   You seem to sweat a lot.   You have a sudden increase in weight, 5 pounds or more in one week.   You are losing weight, 3 pounds or more in a week.   Your blood glucose level is high, and you need instructions on what to do about it.  SEEK IMMEDIATE MEDICAL CARE IF:   You develop a severe headache.   You faint or pass out.   You develop nausea and vomiting.   You become disoriented or confused.   You have a convulsion.   You develop vision problems.   You develop stomach pain.   You develop vaginal bleeding.   You develop uterine contractions.   You have leaking or a gush of fluid from the vagina.  AFTER YOU HAVE THE BABY:  Go to all of your follow-up appointments, and have blood tests as advised by your caregiver.   Maintain a healthy lifestyle, to prevent diabetes in the future. This includes:   Following a healthy meal plan.   Controlling your weight.   Getting enough exercise and proper rest.   Do not smoke.   Breastfeed your baby if you can. This will lower the chance of you and your baby developing diabetes later in life.  For more information about diabetes, go to the American  Diabetes Association at: www.americandiabetesassociation.org. For more information about gestational diabetes, go to the American Congress of Obstetricians and Gynecologists at: www.acog.org. Document Released: 02/24/2001 Document Revised: 11/07/2011 Document Reviewed: 09/18/2009 ExitCare Patient Information 2012 [employer]. 

## 2012-06-02 ENCOUNTER — Encounter: Payer: Self-pay | Admitting: Family

## 2012-06-08 ENCOUNTER — Ambulatory Visit (INDEPENDENT_AMBULATORY_CARE_PROVIDER_SITE_OTHER): Payer: 59 | Admitting: Family

## 2012-06-08 DIAGNOSIS — Z3480 Encounter for supervision of other normal pregnancy, unspecified trimester: Secondary | ICD-10-CM

## 2012-06-08 NOTE — Progress Notes (Signed)
Routine prenatal check. 

## 2012-06-08 NOTE — Progress Notes (Signed)
Reviewed BS log, FBS all normal, except one value 93 first day; postprandials under 120's, except two days 151, 152 after lunch  (realized what she should not have eaten); no questions or concerns

## 2012-06-15 ENCOUNTER — Ambulatory Visit (INDEPENDENT_AMBULATORY_CARE_PROVIDER_SITE_OTHER): Payer: 59 | Admitting: Advanced Practice Midwife

## 2012-06-15 VITALS — BP 104/64 | Temp 98.6°F | Wt 198.0 lb

## 2012-06-15 DIAGNOSIS — Z3400 Encounter for supervision of normal first pregnancy, unspecified trimester: Secondary | ICD-10-CM

## 2012-06-15 DIAGNOSIS — R300 Dysuria: Secondary | ICD-10-CM

## 2012-06-15 NOTE — Progress Notes (Signed)
Fasting: 84-91, B 95-119, L 115-141 (2 over)m D 98-117 Taking CBE.

## 2012-06-15 NOTE — Patient Instructions (Signed)
Contraception Choices Contraception (birth control) is the use of any methods or devices to prevent pregnancy. Below are some methods to help avoid pregnancy. HORMONAL METHODS   Contraceptive implant. This is a thin, plastic tube containing progesterone hormone. It does not contain estrogen hormone. Your caregiver inserts the tube in the inner part of the upper arm. The tube can remain in place for up to 3 years. After 3 years, the implant must be removed. The implant prevents the ovaries from releasing an egg (ovulation), thickens the cervical mucus which prevents sperm from entering the uterus, and thins the lining of the inside of the uterus.   Progesterone-only injections. These injections are given every 3 months by your caregiver to prevent pregnancy. This synthetic progesterone hormone stops the ovaries from releasing eggs. It also thickens cervical mucus and changes the uterine lining. This makes it harder for sperm to survive in the uterus.   Birth control pills. These pills contain estrogen and progesterone hormone. They work by stopping the egg from forming in the ovary (ovulation). Birth control pills are prescribed by a caregiver.Birth control pills can also be used to treat heavy periods.   Minipill. This type of birth control pill contains only the progesterone hormone. They are taken every day of each month and must be prescribed by your caregiver.   Birth control patch. The patch contains hormones similar to those in birth control pills. It must be changed once a week and is prescribed by a caregiver.   Vaginal ring. The ring contains hormones similar to those in birth control pills. It is left in the vagina for 3 weeks, removed for 1 week, and then a new one is put back in place. The patient must be comfortable inserting and removing the ring from the vagina.A caregiver's prescription is necessary.   Emergency contraception. Emergency contraceptives prevent pregnancy after  unprotected sexual intercourse. This pill can be taken right after sex or up to 5 days after unprotected sex. It is most effective the sooner you take the pills after having sexual intercourse. Emergency contraceptive pills are available without a prescription. Check with your pharmacist. Do not use emergency contraception as your only form of birth control.  BARRIER METHODS   Female condom. This is a thin sheath (latex or rubber) that is worn over the penis during sexual intercourse. It can be used with spermicide to increase effectiveness.   Female condom. This is a soft, loose-fitting sheath that is put into the vagina before sexual intercourse.   Diaphragm. This is a soft, latex, dome-shaped barrier that must be fitted by a caregiver. It is inserted into the vagina, along with a spermicidal jelly. It is inserted before intercourse. The diaphragm should be left in the vagina for 6 to 8 hours after intercourse.   Cervical cap. This is a round, soft, latex or plastic cup that fits over the cervix and must be fitted by a caregiver. The cap can be left in place for up to 48 hours after intercourse.   Sponge. This is a soft, circular piece of polyurethane foam. The sponge has spermicide in it. It is inserted into the vagina after wetting it and before sexual intercourse.   Spermicides. These are chemicals that kill or block sperm from entering the cervix and uterus. They come in the form of creams, jellies, suppositories, foam, or tablets. They do not require a prescription. They are inserted into the vagina with an applicator before having sexual intercourse. The process must be   repeated every time you have sexual intercourse.  INTRAUTERINE CONTRACEPTION  Intrauterine device (IUD). This is a T-shaped device that is put in a woman's uterus during a menstrual period to prevent pregnancy. There are 2 types:   Copper IUD. This type of IUD is wrapped in copper wire and is placed inside the uterus. Copper  makes the uterus and fallopian tubes produce a fluid that kills sperm. It can stay in place for 10 years.   Hormone IUD. This type of IUD contains the hormone progestin (synthetic progesterone). The hormone thickens the cervical mucus and prevents sperm from entering the uterus, and it also thins the uterine lining to prevent implantation of a fertilized egg. The hormone can weaken or kill the sperm that get into the uterus. It can stay in place for 5 years.  PERMANENT METHODS OF CONTRACEPTION  Female tubal ligation. This is when the woman's fallopian tubes are surgically sealed, tied, or blocked to prevent the egg from traveling to the uterus.   Female sterilization. This is when the female has the tubes that carry sperm tied off (vasectomy).This blocks sperm from entering the vagina during sexual intercourse. After the procedure, the man can still ejaculate fluid (semen).  NATURAL PLANNING METHODS  Natural family planning. This is not having sexual intercourse or using a barrier method (condom, diaphragm, cervical cap) on days the woman could become pregnant.   Calendar method. This is keeping track of the length of each menstrual cycle and identifying when you are fertile.   Ovulation method. This is avoiding sexual intercourse during ovulation.   Symptothermal method. This is avoiding sexual intercourse during ovulation, using a thermometer and ovulation symptoms.   Post-ovulation method. This is timing sexual intercourse after you have ovulated.  Regardless of which type or method of contraception you choose, it is important that you use condoms to protect against the transmission of sexually transmitted diseases (STDs). Talk with your caregiver about which form of contraception is most appropriate for you. Document Released: 11/18/2005 Document Revised: 11/07/2011 Document Reviewed: 03/27/2011 Exit[employer] Patient Information 2012 [employer]. 

## 2012-06-15 NOTE — Progress Notes (Signed)
p=91 

## 2012-06-17 LAB — CULTURE, OB URINE: Colony Count: 8000

## 2012-06-29 ENCOUNTER — Encounter: Payer: Self-pay | Admitting: Family

## 2012-06-29 ENCOUNTER — Ambulatory Visit (INDEPENDENT_AMBULATORY_CARE_PROVIDER_SITE_OTHER): Payer: 59 | Admitting: Family

## 2012-06-29 VITALS — BP 110/64 | Wt 206.0 lb

## 2012-06-29 DIAGNOSIS — Z3400 Encounter for supervision of normal first pregnancy, unspecified trimester: Secondary | ICD-10-CM

## 2012-06-29 NOTE — Progress Notes (Signed)
Patient is having increased cramping and some pressure that started Tuesday of last week.  Very low "pinchy cramps"

## 2012-06-29 NOTE — Progress Notes (Signed)
Reports sharp pain in groin with walking, "feels pinchy"; no bleeding, leaking of fluid, or contractions.  Provided reassurance and education on warning signs.  Completed childbirth education classes. 2 week BS:  FBS 84-91 (1 abnl); PPB 110-121 (1 abnl); 114-132 (3 abnl); 112-124 (1 abnl); doing well with diet controlled GDM

## 2012-07-13 ENCOUNTER — Ambulatory Visit (INDEPENDENT_AMBULATORY_CARE_PROVIDER_SITE_OTHER): Payer: 59 | Admitting: Family

## 2012-07-13 VITALS — BP 119/73 | Wt 205.0 lb

## 2012-07-13 DIAGNOSIS — O99810 Abnormal glucose complicating pregnancy: Secondary | ICD-10-CM

## 2012-07-13 DIAGNOSIS — O24419 Gestational diabetes mellitus in pregnancy, unspecified control: Secondary | ICD-10-CM | POA: Insufficient documentation

## 2012-07-13 DIAGNOSIS — Z3400 Encounter for supervision of normal first pregnancy, unspecified trimester: Secondary | ICD-10-CM

## 2012-07-13 NOTE — Progress Notes (Signed)
FBS 85-92 (2 of 14 abnl); BK 115-124 (3 of 14 abnl); lunch 114-130 (5 of 14 abnl); dinner 112-122 (3 of 14 abnl); no questions or concerns; schedule growth ultrasound for 38 wks.

## 2012-07-20 ENCOUNTER — Ambulatory Visit (INDEPENDENT_AMBULATORY_CARE_PROVIDER_SITE_OTHER): Payer: 59 | Admitting: Advanced Practice Midwife

## 2012-07-20 ENCOUNTER — Encounter: Payer: Self-pay | Admitting: Advanced Practice Midwife

## 2012-07-20 VITALS — BP 126/72 | Wt 207.0 lb

## 2012-07-20 DIAGNOSIS — O0990 Supervision of high risk pregnancy, unspecified, unspecified trimester: Secondary | ICD-10-CM

## 2012-07-20 DIAGNOSIS — O99810 Abnormal glucose complicating pregnancy: Secondary | ICD-10-CM

## 2012-07-20 DIAGNOSIS — O24419 Gestational diabetes mellitus in pregnancy, unspecified control: Secondary | ICD-10-CM

## 2012-07-20 NOTE — Patient Instructions (Signed)
Pregnancy - Third Trimester The third trimester of pregnancy (the last 3 months) is a period of the most rapid growth for you and your baby. The baby approaches a length of 20 inches and a weight of 6 to 10 pounds. The baby is adding on fat and getting ready for life outside your body. While inside, babies have periods of sleeping and waking, suck their thumbs, and hiccups. You can often feel small contractions of the uterus. This is false labor. It is also called Braxton-Hicks contractions. This is like a practice for labor. The usual problems in this stage of pregnancy include more difficulty breathing, swelling of the hands and feet from water retention, and having to urinate more often because of the uterus and baby pressing on your bladder.  PRENATAL EXAMS  Blood work may continue to be done during prenatal exams. These tests are done to check on your health and the probable health of your baby. Blood work is used to follow your blood levels (hemoglobin). Anemia (low hemoglobin) is common during pregnancy. Iron and vitamins are given to help prevent this. You may also continue to be checked for diabetes. Some of the past blood tests may be done again.   The size of the uterus is measured during each visit. This makes sure your baby is growing properly according to your pregnancy dates.   Your blood pressure is checked every prenatal visit. This is to make sure you are not getting toxemia.   Your urine is checked every prenatal visit for infection, diabetes and protein.   Your weight is checked at each visit. This is done to make sure gains are happening at the suggested rate and that you and your baby are growing normally.   Sometimes, an ultrasound is performed to confirm the position and the proper growth and development of the baby. This is a test done that bounces harmless sound waves off the baby so your caregiver can more accurately determine due dates.   Discuss the type of pain  medication and anesthesia you will have during your labor and delivery.   Discuss the possibility and anesthesia if a Cesarean Section might be necessary.   Inform your caregiver if there is any mental or physical violence at home.  Sometimes, a specialized non-stress test, contraction stress test and biophysical profile are done to make sure the baby is not having a problem. Checking the amniotic fluid surrounding the baby is called an amniocentesis. The amniotic fluid is removed by sticking a needle into the belly (abdomen). This is sometimes done near the end of pregnancy if an early delivery is required. In this case, it is done to help make sure the baby's lungs are mature enough for the baby to live outside of the womb. If the lungs are not mature and it is unsafe to deliver the baby, an injection of cortisone medication is given to the mother 1 to 2 days before the delivery. This helps the baby's lungs mature and makes it safer to deliver the baby. CHANGES OCCURING IN THE THIRD TRIMESTER OF PREGNANCY Your body goes through many changes during pregnancy. They vary from person to person. Talk to your caregiver about changes you notice and are concerned about.  During the last trimester, you have probably had an increase in your appetite. It is normal to have cravings for certain foods. This varies from person to person and pregnancy to pregnancy.   You may begin to get stretch marks on your hips,   abdomen, and breasts. These are normal changes in the body during pregnancy. There are no exercises or medications to take which prevent this change.   Constipation may be treated with a stool softener or adding bulk to your diet. Drinking lots of fluids, fiber in vegetables, fruits, and whole grains are helpful.   Exercising is also helpful. If you have been very active up until your pregnancy, most of these activities can be continued during your pregnancy. If you have been less active, it is helpful  to start an exercise program such as walking. Consult your caregiver before starting exercise programs.   Avoid all smoking, alcohol, un-prescribed drugs, herbs and "street drugs" during your pregnancy. These chemicals affect the formation and growth of the baby. Avoid chemicals throughout the pregnancy to ensure the delivery of a healthy infant.   Backache, varicose veins and hemorrhoids may develop or get worse.   You will tire more easily in the third trimester, which is normal.   The baby's movements may be stronger and more often.   You may become short of breath easily.   Your belly button may stick out.   A yellow discharge may leak from your breasts called colostrum.   You may have a bloody mucus discharge. This usually occurs a few days to a week before labor begins.  HOME CARE INSTRUCTIONS   Keep your caregiver's appointments. Follow your caregiver's instructions regarding medication use, exercise, and diet.   During pregnancy, you are providing food for you and your baby. Continue to eat regular, well-balanced meals. Choose foods such as meat, fish, milk and other low fat dairy products, vegetables, fruits, and whole-grain breads and cereals. Your caregiver will tell you of the ideal weight gain.   A physical sexual relationship may be continued throughout pregnancy if there are no other problems such as early (premature) leaking of amniotic fluid from the membranes, vaginal bleeding, or belly (abdominal) pain.   Exercise regularly if there are no restrictions. Check with your caregiver if you are unsure of the safety of your exercises. Greater weight gain will occur in the last 2 trimesters of pregnancy. Exercising helps:   Control your weight.   Get you in shape for labor and delivery.   You lose weight after you deliver.   Rest a lot with legs elevated, or as needed for leg cramps or low back pain.   Wear a good support or jogging bra for breast tenderness during  pregnancy. This may help if worn during sleep. Pads or tissues may be used in the bra if you are leaking colostrum.   Do not use hot tubs, steam rooms, or saunas.   Wear your seat belt when driving. This protects you and your baby if you are in an accident.   Avoid raw meat, cat litter boxes and soil used by cats. These carry germs that can cause birth defects in the baby.   It is easier to loose urine during pregnancy. Tightening up and strengthening the pelvic muscles will help with this problem. You can practice stopping your urination while you are going to the bathroom. These are the same muscles you need to strengthen. It is also the muscles you would use if you were trying to stop from passing gas. You can practice tightening these muscles up 10 times a set and repeating this about 3 times per day. Once you know what muscles to tighten up, do not perform these exercises during urination. It is more likely   to cause an infection by backing up the urine.   Ask for help if you have financial, counseling or nutritional needs during pregnancy. Your caregiver will be able to offer counseling for these needs as well as refer you for other special needs.   Make a list of emergency phone numbers and have them available.   Plan on getting help from family or friends when you go home from the hospital.   Make a trial run to the hospital.   Take prenatal classes with the father to understand, practice and ask questions about the labor and delivery.   Prepare the baby's room/nursery.   Do not travel out of the city unless it is absolutely necessary and with the advice of your caregiver.   Wear only low or no heal shoes to have better balance and prevent falling.  MEDICATIONS AND DRUG USE IN PREGNANCY  Take prenatal vitamins as directed. The vitamin should contain 1 milligram of folic acid. Keep all vitamins out of reach of children. Only a couple vitamins or tablets containing iron may be fatal  to a baby or young child when ingested.   Avoid use of all medications, including herbs, over-the-counter medications, not prescribed or suggested by your caregiver. Only take over-the-counter or prescription medicines for pain, discomfort, or fever as directed by your caregiver. Do not use aspirin, ibuprofen (Motrin, Advil, Nuprin) or naproxen (Aleve) unless OK'd by your caregiver.   Let your caregiver also know about herbs you may be using.   Alcohol is related to a number of birth defects. This includes fetal alcohol syndrome. All alcohol, in any form, should be avoided completely. Smoking will cause low birth rate and premature babies.   Street/illegal drugs are very harmful to the baby. They are absolutely forbidden. A baby born to an addicted mother will be addicted at birth. The baby will go through the same withdrawal an adult does.  SEEK MEDICAL CARE IF: You have any concerns or worries during your pregnancy. It is better to call with your questions if you feel they cannot wait, rather than worry about them. DECISIONS ABOUT CIRCUMCISION You may or may not know the sex of your baby. If you know your baby is a boy, it may be time to think about circumcision. Circumcision is the removal of the foreskin of the penis. This is the skin that covers the sensitive end of the penis. There is no proven medical need for this. Often this decision is made on what is popular at the time or based upon religious beliefs and social issues. You can discuss these issues with your caregiver or pediatrician. SEEK IMMEDIATE MEDICAL CARE IF:   An unexplained oral temperature above 102 F (38.9 C) develops, or as your caregiver suggests.   You have leaking of fluid from the vagina (birth canal). If leaking membranes are suspected, take your temperature and tell your caregiver of this when you call.   There is vaginal spotting, bleeding or passing clots. Tell your caregiver of the amount and how many pads are  used.   You develop a bad smelling vaginal discharge with a change in the color from clear to white.   You develop vomiting that lasts more than 24 hours.   You develop chills or fever.   You develop shortness of breath.   You develop burning on urination.   You loose more than 2 pounds of weight or gain more than 2 pounds of weight or as suggested by your   caregiver.   You notice sudden swelling of your face, hands, and feet or legs.   You develop belly (abdominal) pain. Round ligament discomfort is a common non-cancerous (benign) cause of abdominal pain in pregnancy. Your caregiver still must evaluate you.   You develop a severe headache that does not go away.   You develop visual problems, blurred or double vision.   If you have not felt your baby move for more than 1 hour. If you think the baby is not moving as much as usual, eat something with sugar in it and lie down on your left side for an hour. The baby should move at least 4 to 5 times per hour. Call right away if your baby moves less than that.   You fall, are in a car accident or any kind of trauma.   There is mental or physical violence at home.  Document Released: 11/12/2001 Document Revised: 11/07/2011 Document Reviewed: 05/17/2009 [employer] Patient Information 2012 [employer]. 

## 2012-07-20 NOTE — Progress Notes (Signed)
Doing well. GBS and cultures done. Cervix 1/60/-3/ vtx.  FBS 85-90, Breakfast 115-120, Lunch 115-135 (most 118-120), Dinner 114-120.  [place] scheduled for 2 weeks.

## 2012-07-24 LAB — STREP B DNA PROBE: GBSP: NEGATIVE

## 2012-07-27 ENCOUNTER — Ambulatory Visit (INDEPENDENT_AMBULATORY_CARE_PROVIDER_SITE_OTHER): Payer: 59 | Admitting: Advanced Practice Midwife

## 2012-07-27 VITALS — BP 118/70 | Wt 210.0 lb

## 2012-07-27 DIAGNOSIS — O99810 Abnormal glucose complicating pregnancy: Secondary | ICD-10-CM

## 2012-07-27 DIAGNOSIS — O24419 Gestational diabetes mellitus in pregnancy, unspecified control: Secondary | ICD-10-CM

## 2012-07-27 DIAGNOSIS — Z3480 Encounter for supervision of other normal pregnancy, unspecified trimester: Secondary | ICD-10-CM

## 2012-07-27 NOTE — Patient Instructions (Signed)
Normal Labor and Delivery Your caregiver must first be sure you are in labor. Signs of labor include:  You may pass what is called "the mucus plug" before labor begins. This is a small amount of blood stained mucus.   Regular uterine contractions.   The time between contractions get closer together.   The discomfort and pain gradually gets more intense.   Pains are mostly located in the back.   Pains get worse when walking.   The cervix (the opening of the uterus becomes thinner (begins to efface) and opens up (dilates).  Once you are in labor and admitted into the hospital or care center, your caregiver will do the following:  A complete physical examination.   Check your vital signs (blood pressure, pulse, temperature and the fetal heart rate).   Do a vaginal examination (using a sterile glove and lubricant) to determine:   The position (presentation) of the baby (head [vertex] or buttock first).   The level (station) of the baby's head in the birth canal.   The effacement and dilatation of the cervix.   You may have your pubic hair shaved and be given an enema depending on your caregiver and the circumstance.   An electronic monitor is usually placed on your abdomen. The monitor follows the length and intensity of the contractions, as well as the baby's heart rate.   Usually, your caregiver will insert an IV in your arm with a bottle of sugar water. This is done as a precaution so that medications can be given to you quickly during labor or delivery.  NORMAL LABOR AND DELIVERY IS DIVIDED UP INTO 3 STAGES: First Stage This is when regular contractions begin and the cervix begins to efface and dilate. This stage can last from 3 to 15 hours. The end of the first stage is when the cervix is 100% effaced and 10 centimeters dilated. Pain medications may be given by   Injection (morphine, demerol, etc.)   Regional anesthesia (spinal, caudal or epidural, anesthetics given in  different locations of the spine). Paracervical pain medication may be given, which is an injection of and anesthetic on each side of the cervix.  A pregnant woman may request to have "Natural Childbirth" which is not to have any medications or anesthesia during her labor and delivery. Second Stage This is when the baby comes down through the birth canal (vagina) and is born. This can take 1 to 4 hours. As the baby's head comes down through the birth canal, you may feel like you are going to have a bowel movement. You will get the urge to bear down and push until the baby is delivered. As the baby's head is being delivered, the caregiver will decide if an episiotomy (a cut in the perineum and vagina area) is needed to prevent tearing of the tissue in this area. The episiotomy is sewn up after the delivery of the baby and placenta. Sometimes a mask with nitrous oxide is given for the mother to breath during the delivery of the baby to help if there is too much pain. The end of Stage 2 is when the baby is fully delivered. Then when the umbilical cord stops pulsating it is clamped and cut. Third Stage The third stage begins after the baby is completely delivered and ends after the placenta (afterbirth) is delivered. This usually takes 5 to 30 minutes. After the placenta is delivered, a medication is given either by intravenous or injection to help contract   the uterus and prevent bleeding. The third stage is not painful and pain medication is usually not necessary. If an episiotomy was done, it is repaired at this time. After the delivery, the mother is watched and monitored closely for 1 to 2 hours to make sure there is no postpartum bleeding (hemorrhage). If there is a lot of bleeding, medication is given to contract the uterus and stop the bleeding. Document Released: 08/27/2008 Document Revised: 11/07/2011 Document Reviewed: 08/27/2008 [employer] Patient Information 2012 [employer]. 

## 2012-07-27 NOTE — Progress Notes (Signed)
Informed GC/Chl/GBS are all Negative. Reviewed postpartum routines. FBS 85-89, B 112-123, L 116-124, D 114-121

## 2012-07-27 NOTE — Progress Notes (Signed)
Routine prenatal check, doing well. 

## 2012-08-04 ENCOUNTER — Ambulatory Visit (HOSPITAL_COMMUNITY)
Admission: RE | Admit: 2012-08-04 | Discharge: 2012-08-04 | Disposition: A | Discharge status: Home / Self Care | Payer: 59 | Source: Ambulatory Visit | Attending: Advanced Practice Midwife | Admitting: Advanced Practice Midwife

## 2012-08-04 DIAGNOSIS — Z3689 Encounter for other specified antenatal screening: Secondary | ICD-10-CM | POA: Insufficient documentation

## 2012-08-04 DIAGNOSIS — O99810 Abnormal glucose complicating pregnancy: Secondary | ICD-10-CM | POA: Insufficient documentation

## 2012-08-04 DIAGNOSIS — O24419 Gestational diabetes mellitus in pregnancy, unspecified control: Secondary | ICD-10-CM

## 2012-08-04 NOTE — Progress Notes (Signed)
[place] reviewed  Growth appropriate at 72% AFI normal

## 2012-08-07 ENCOUNTER — Ambulatory Visit (INDEPENDENT_AMBULATORY_CARE_PROVIDER_SITE_OTHER): Payer: 59 | Admitting: Advanced Practice Midwife

## 2012-08-07 VITALS — BP 115/73 | Wt 212.0 lb

## 2012-08-07 DIAGNOSIS — Z3480 Encounter for supervision of other normal pregnancy, unspecified trimester: Secondary | ICD-10-CM

## 2012-08-07 DIAGNOSIS — O24419 Gestational diabetes mellitus in pregnancy, unspecified control: Secondary | ICD-10-CM

## 2012-08-07 DIAGNOSIS — O99810 Abnormal glucose complicating pregnancy: Secondary | ICD-10-CM

## 2012-08-07 NOTE — Patient Instructions (Signed)

## 2012-08-07 NOTE — Progress Notes (Signed)
Routine prenatal check.  Doing well. 

## 2012-08-07 NOTE — Progress Notes (Signed)
Fasting 85-91, 2 hour PC 112-133 (two out of range), Decrease FM. NST Category I. Membranes swept. [place] at 38 weeks EFW 7-5, AFI 14. Discussed plan for IOL at 40 weeks.

## 2012-08-14 ENCOUNTER — Inpatient Hospital Stay (HOSPITAL_COMMUNITY)
Admission: AD | Admit: 2012-08-14 | Discharge: 2012-08-14 | Disposition: A | Discharge status: Home / Self Care | Payer: 59 | Source: Ambulatory Visit | Attending: Obstetrics & Gynecology | Admitting: Obstetrics & Gynecology

## 2012-08-14 ENCOUNTER — Ambulatory Visit (INDEPENDENT_AMBULATORY_CARE_PROVIDER_SITE_OTHER): Payer: 59 | Admitting: Family

## 2012-08-14 ENCOUNTER — Encounter (HOSPITAL_COMMUNITY): Payer: Self-pay | Admitting: *Deleted

## 2012-08-14 VITALS — BP 145/85 | Wt 212.0 lb

## 2012-08-14 DIAGNOSIS — O24419 Gestational diabetes mellitus in pregnancy, unspecified control: Secondary | ICD-10-CM

## 2012-08-14 DIAGNOSIS — O99810 Abnormal glucose complicating pregnancy: Secondary | ICD-10-CM

## 2012-08-14 DIAGNOSIS — O0990 Supervision of high risk pregnancy, unspecified, unspecified trimester: Secondary | ICD-10-CM

## 2012-08-14 DIAGNOSIS — O139 Gestational [pregnancy-induced] hypertension without significant proteinuria, unspecified trimester: Secondary | ICD-10-CM | POA: Insufficient documentation

## 2012-08-14 DIAGNOSIS — I10 Essential (primary) hypertension: Secondary | ICD-10-CM

## 2012-08-14 LAB — COMPREHENSIVE METABOLIC PANEL
ALT: 9 U/L (ref 0–35)
AST: 15 U/L (ref 0–37)
Albumin: 2.7 g/dL — ABNORMAL LOW (ref 3.5–5.2)
CO2: 20 mEq/L (ref 19–32)
Calcium: 9.2 mg/dL (ref 8.4–10.5)
Creatinine, Ser: 0.55 mg/dL (ref 0.50–1.10)
GFR calc non Af Amer: >90 mL/min (ref >90)
Sodium: 136 mEq/L (ref 135–145)
Total Protein: 6.5 g/dL (ref 6.0–8.3)

## 2012-08-14 LAB — CBC
MCH: 27.8 pg (ref 26.0–34.0)
Platelets: 183 10*3/uL (ref 150–400)
RBC: 4.42 MIL/uL (ref 3.87–5.11)
RDW: 13.8 % (ref 11.5–15.5)

## 2012-08-14 LAB — PROTEIN / CREATININE RATIO, URINE
Protein Creatinine Ratio: 0.11 (ref 0.00–0.15)
Total Protein, Urine: 9.4 mg/dL

## 2012-08-14 NOTE — Progress Notes (Signed)
Denies headache, vision changes, epigastric pain; to MAU for labs and NST, consider IOL for abnormal labs; if wnl schedule IOL for 08/18/12.

## 2012-08-14 NOTE — MAU Note (Signed)
Pt reports her b/p elevated in office visit today. Sent for [facility] labs. Pt denies H/A or abd pain.

## 2012-08-14 NOTE — Progress Notes (Signed)
p=90 

## 2012-08-15 ENCOUNTER — Encounter (HOSPITAL_COMMUNITY): Payer: Self-pay | Admitting: *Deleted

## 2012-08-15 ENCOUNTER — Encounter (HOSPITAL_COMMUNITY): Payer: Self-pay

## 2012-08-15 ENCOUNTER — Inpatient Hospital Stay (HOSPITAL_COMMUNITY)
Admission: AD | Admit: 2012-08-15 | Discharge: 2012-08-15 | Disposition: A | Discharge status: Home / Self Care | Payer: 59 | Source: Ambulatory Visit | Attending: Obstetrics and Gynecology | Admitting: Obstetrics and Gynecology

## 2012-08-15 ENCOUNTER — Inpatient Hospital Stay (HOSPITAL_COMMUNITY)
Admission: AD | Admit: 2012-08-15 | Discharge: 2012-08-17 | DRG: 775 | Disposition: A | Discharge status: Home / Self Care | Payer: 59 | Source: Ambulatory Visit | Attending: Obstetrics and Gynecology | Admitting: Obstetrics and Gynecology

## 2012-08-15 DIAGNOSIS — O99814 Abnormal glucose complicating childbirth: Secondary | ICD-10-CM | POA: Diagnosis present

## 2012-08-15 DIAGNOSIS — O0990 Supervision of high risk pregnancy, unspecified, unspecified trimester: Secondary | ICD-10-CM

## 2012-08-15 DIAGNOSIS — Z01812 Encounter for preprocedural laboratory examination: Secondary | ICD-10-CM

## 2012-08-15 DIAGNOSIS — IMO0001 Reserved for inherently not codable concepts without codable children: Secondary | ICD-10-CM

## 2012-08-15 DIAGNOSIS — O479 False labor, unspecified: Secondary | ICD-10-CM | POA: Insufficient documentation

## 2012-08-15 DIAGNOSIS — O24419 Gestational diabetes mellitus in pregnancy, unspecified control: Secondary | ICD-10-CM

## 2012-08-15 LAB — GLUCOSE, CAPILLARY: Glucose-Capillary: 113 mg/dL — ABNORMAL HIGH (ref 70–99)

## 2012-08-15 LAB — CBC
Hemoglobin: 12.5 g/dL (ref 12.0–15.0)
MCHC: 33.2 g/dL (ref 30.0–36.0)
Platelets: 203 10*3/uL (ref 150–400)
RDW: 13.7 % (ref 11.5–15.5)

## 2012-08-15 LAB — TYPE AND SCREEN
ABO/RH(D): A POS
Antibody Screen: NEGATIVE

## 2012-08-15 MED ORDER — LACTATED RINGERS IV SOLN: INTRAVENOUS | Status: DC

## 2012-08-15 MED ORDER — CITRIC ACID-SODIUM CITRATE 334-500 MG/5ML PO SOLN: 30.0000 mL | ORAL | Status: DC | PRN

## 2012-08-15 MED ORDER — OXYTOCIN 10 UNIT/ML IJ SOLN
INTRAMUSCULAR | Status: AC
  Administered 2012-08-15: 20 [IU]
  Filled 2012-08-15: qty 2

## 2012-08-15 MED ORDER — LACTATED RINGERS IV SOLN: 500.0000 mL | INTRAVENOUS | Status: DC | PRN

## 2012-08-15 MED ORDER — ACETAMINOPHEN 325 MG PO TABS: 650.0000 mg | ORAL_TABLET | ORAL | Status: DC | PRN

## 2012-08-15 MED ORDER — OXYTOCIN 40 UNITS IN LACTATED RINGERS INFUSION - SIMPLE MED: 62.5000 mL/h | Freq: Once | INTRAVENOUS | Status: DC

## 2012-08-15 MED ORDER — LIDOCAINE HCL (PF) 1 % IJ SOLN
30.0000 mL | INTRAMUSCULAR | Status: DC | PRN
  Administered 2012-08-15: 30 mL via SUBCUTANEOUS

## 2012-08-15 MED ORDER — OXYTOCIN BOLUS FROM INFUSION
500.0000 mL | Freq: Once | INTRAVENOUS | Status: DC
  Filled 2012-08-15: qty 500

## 2012-08-15 MED ORDER — IBUPROFEN 600 MG PO TABS
600.0000 mg | ORAL_TABLET | Freq: Four times a day (QID) | ORAL | Status: DC | PRN
  Administered 2012-08-15: 600 mg via ORAL
  Filled 2012-08-15: qty 1

## 2012-08-15 MED ORDER — OXYCODONE-ACETAMINOPHEN 5-325 MG PO TABS: 1.0000 | ORAL_TABLET | ORAL | Status: DC | PRN

## 2012-08-15 MED ORDER — ONDANSETRON HCL 4 MG/2ML IJ SOLN: 4.0000 mg | Freq: Four times a day (QID) | INTRAMUSCULAR | Status: DC | PRN

## 2012-08-15 MED ORDER — LIDOCAINE HCL (PF) 1 % IJ SOLN
INTRAMUSCULAR | Status: AC
  Filled 2012-08-15: qty 30

## 2012-08-15 MED ORDER — FENTANYL CITRATE 0.05 MG/ML IJ SOLN: 50.0000 ug | INTRAMUSCULAR | Status: DC | PRN

## 2012-08-15 MED ORDER — FLEET ENEMA 7-19 GM/118ML RE ENEM: 1.0000 | ENEMA | RECTAL | Status: DC | PRN

## 2012-08-15 NOTE — Progress Notes (Signed)
Delivery of live viable female by Dr Jolayne Panther, assisted by K. Grace Bushy, CNM APGARs 8,9 NICU at bedside

## 2012-08-15 NOTE — MAU Note (Signed)
Reports ctx started getting strong this morning. Denies SROM at this time and reports good fetal movment

## 2012-08-15 NOTE — H&P (Signed)
Lisa Cannon is a 31 y.o. G44P1001  female at [redacted]w[redacted]d presenting in advanced active labor at 9cm.  Returned approximately 2hrs after being d/c'd from [facility]au in early labor at 3cm.  Denies LOF or VB.  +FM.  Received pnc at [facility].  A1DM- well controlled w/ diet per pt.  2hr pp yest was 150 which was abnormally high for her.    Maternal Medical History:  Reason for admission: Reason for admission: contractions.  Reason for Admission:   nauseaContractions: Onset was 6-12 hours ago.   Frequency: regular.   Perceived severity is strong.    Fetal activity: Perceived fetal activity is normal.   Last perceived fetal movement was within the past hour.    Prenatal complications: no prenatal complications Prenatal Complications - Diabetes: gestational. Diabetes is managed by diet.      OB History    Grav Para Term Preterm Abortions TAB SAB Ect Mult Living   1 0 0 0 0 0 0 0 0 0      Past Medical History  Diagnosis Date  . Migraines   . Abnormal Pap smear of cervix    Past Surgical History  Procedure Date  . Cervical biopsy  w/ loop electrode excision 2002  . Wisdom tooth extraction    Family History: family history includes Cancer in her paternal grandfather and paternal grandmother; Diabetes in her maternal aunt; and Hypertension in her maternal aunt and maternal grandfather. Social History:  reports that she has never smoked. She has never used smokeless tobacco. She reports that she does not drink alcohol or use illicit drugs.   Prenatal Transfer Tool  Maternal Diabetes: Yes:  Diabetes Type:  Diet controlled gestational Genetic Screening: Normal Maternal Ultrasounds/Referrals: Abnormal:  Findings:   Other: normal other than RVOT not well visualized on multiple scans Fetal Ultrasounds or other Referrals:  None Maternal Substance Abuse:  No Significant Maternal Medications:  None Significant Maternal Lab Results:  Lab values include: Group B Strep negative Other Comments:   None  Review of Systems  Constitutional: Negative.   HENT: Negative.   Eyes: Negative.  Negative for blurred vision and double vision.  Respiratory: Negative.   Cardiovascular: Negative.   Gastrointestinal: Positive for abdominal pain (r/t uc's. denies ruq/epigastric pain). Negative for heartburn, nausea and vomiting.  Genitourinary: Negative.   Musculoskeletal: Positive for back pain (r/t uc's).  Skin: Negative.   Neurological: Negative.  Negative for headaches.  Endo/Heme/Allergies: Negative.   Psychiatric/Behavioral: Negative.     Dilation: Lip/rim Effacement (%): 100 Station: 0 Exam by:: K Analiya Porco CNM Last menstrual period 11/11/2011. Maternal Exam:  Uterine Assessment: Contraction strength is firm.  Contraction frequency is regular.   Abdomen: Patient reports no abdominal tenderness. Fetal presentation: vertex  Introitus: Normal vulva. Normal vagina.  Pelvis: adequate for delivery.   Cervix: Cervix evaluated by digital exam.     Fetal Exam Fetal Monitor Review: Mode: ultrasound.   Baseline rate: 125.  Variability: moderate (6-25 bpm).   Pattern: accelerations present and no decelerations.   Prior to arom.  After arom, tracing intermittent, but picking up in 70s  Fetal State Assessment: Category I - tracings are normal.     Physical Exam  Constitutional: She is oriented to person, place, and time. She appears well-developed and well-nourished.  HENT:  Head: Normocephalic.  Neck: Normal range of motion.  Cardiovascular: Normal rate and regular rhythm.   Respiratory: Effort normal and breath sounds normal.  GI: Soft.       gravid  Genitourinary:  Vagina normal and uterus normal.  Musculoskeletal: Normal range of motion. She exhibits edema (1+ BLE).  Neurological: She is alert and oriented to person, place, and time. She has normal reflexes.       No clonus  Skin: Skin is warm and dry.  Psychiatric: She has a normal mood and affect. Her behavior is normal.  Judgment and thought content normal.    Prenatal labs: ABO, Rh: A/POS/-- (02/12 1003) Antibody: NEG (02/12 1003) Rubella: 202.6 (02/12 1003) RPR: NON REAC (06/14 0852)  HBsAg: NEGATIVE (02/12 1003)  HIV: NON REACTIVE (06/14 0852)  GBS: NEGATIVE (08/19 0830)   Assessment/Plan: A:  [redacted]w[redacted]d SIUP  Advanced active labor  GBS neg  A1DM  P:  Admitted to [facility]  Expectant management     Marge Duncans 08/15/2012, 6:31 PM

## 2012-08-15 NOTE — MAU Provider Note (Signed)
History   Lisa Cannon is a G9P0000  31 y.o. female at [redacted]w[redacted]d who presents with report of uc's that began at 0830 that have progressively got stronger and closer together throughout the day.  Denies LOF or VB.  Good FM.  Next appt on Mon @ [facility], IOL Tues.  SVE yest in office 2/80/-2, had elevated bp's so was sent to mau for pre-e evaluation, labs came back normal.  Denies HA, scotomata, ruq/epigastric pain, n/v today.    CSN: [id number]  Arrival date and time: 08/15/12 1146   None     Chief Complaint  Patient presents with  . Labor Eval   HPI  OB History    Grav Para Term Preterm Abortions TAB SAB Ect Mult Living   1 0 0 0 0 0 0 0 0 0       Past Medical History  Diagnosis Date  . Migraines   . Abnormal Pap smear of cervix     Past Surgical History  Procedure Date  . Cervical biopsy  w/ loop electrode excision 2002  . Wisdom tooth extraction     Family History  Problem Relation Age of Onset  . Diabetes Maternal Aunt   . Hypertension Maternal Aunt   . Hypertension Maternal Grandfather   . Cancer Paternal Grandfather     lung cancer  . Cancer Paternal Grandmother     brain    History  Substance Use Topics  . Smoking status: Never Smoker   . Smokeless tobacco: Never Used  . Alcohol Use: No    Allergies: No Known Allergies  Prescriptions prior to admission  Medication Sig Dispense Refill  . cyclobenzaprine (FLEXERIL) 10 MG tablet Take 10 mg by mouth at bedtime as needed. For muscle spasms      . famotidine (PEPCID) 20 MG tablet Take 20 mg by mouth daily as needed. For heartburn      . montelukast (SINGULAIR) 10 MG tablet Take 10 mg by mouth at bedtime.        . nadolol (CORGARD) 40 MG tablet Take 40-80 mg by mouth 2 (two) times daily. Pt takes 1 tablet in the morning & 2 tablets at night      . Prenatal Vit-Fe Fumarate-FA (PRENATAL MULTIVITAMIN) TABS Take 1 tablet by mouth every morning.        Review of Systems  Constitutional: Negative.     HENT: Negative.   Eyes: Negative.  Negative for blurred vision and double vision.  Respiratory: Negative.   Cardiovascular: Negative.   Gastrointestinal: Positive for abdominal pain (r/t uc's. Denies ruq/epigastric pain). Negative for heartburn, nausea and vomiting.  Genitourinary: Negative.   Musculoskeletal: Positive for back pain (r/t uc's).  Skin: Negative.   Neurological: Negative.  Negative for headaches.  Endo/Heme/Allergies: Negative.   Psychiatric/Behavioral: Negative.    Physical Exam   Blood pressure 139/88, pulse 80, temperature 97.7 F (36.5 C), temperature source Oral, resp. rate 20, last menstrual period 11/11/2011.  Physical Exam  Constitutional: She is oriented to person, place, and time. She appears well-developed and well-nourished.  HENT:  Head: Normocephalic.  Neck: Normal range of motion.  Cardiovascular: Normal rate and regular rhythm.   Respiratory: Effort normal and breath sounds normal.  GI: Soft.       gravid  Genitourinary: Vagina normal and uterus normal.  Musculoskeletal: Normal range of motion. She exhibits edema (1+ BLE).  Neurological: She is alert and oriented to person, place, and time. She has normal reflexes.  No clonus  Skin: Skin is warm and dry.  Psychiatric: She has a normal mood and affect. Her behavior is normal. Judgment and thought content normal.   FHR: 130, moderate variability, 15x15accels, no decels=Cat I FHR UCs: q 3-5mins, mild to palpation MAU Course  Procedures  EFM SVE upon admission, recheck 1hr later, allowed to walk x 1 hour and rechecked.  Minimal cervical change from 3-3.5cm in 3hours.  Assessment and Plan  A:  [redacted]w[redacted]d SIUP  Probable early labor  Cat I FHR   P:  D/C home  Keep appt at [facility] on Mon if still pregnant  Warm baths, birthing ball, apap if needed  Reviewed warning signs for pre-e, pt verbalized understanding  Return to hospital prn labor, LOF, VB, decreased FM, or for  concerns   Marge Duncans 08/15/2012, 3:24 PM

## 2012-08-16 LAB — CBC
MCH: 28.7 pg (ref 26.0–34.0)
MCHC: 33.9 g/dL (ref 30.0–36.0)
MCV: 84.8 fL (ref 78.0–100.0)
Platelets: 178 10*3/uL (ref 150–400)
RBC: 4.07 MIL/uL (ref 3.87–5.11)

## 2012-08-16 MED ORDER — TETANUS-DIPHTH-ACELL PERTUSSIS 5-2.5-18.5 LF-MCG/0.5 IM SUSP: 0.5000 mL | Freq: Once | INTRAMUSCULAR | Status: DC

## 2012-08-16 MED ORDER — ONDANSETRON HCL 4 MG PO TABS: 4.0000 mg | ORAL_TABLET | ORAL | Status: DC | PRN

## 2012-08-16 MED ORDER — DIBUCAINE 1 % RE OINT
1.0000 "application " | TOPICAL_OINTMENT | RECTAL | Status: DC | PRN
  Administered 2012-08-16: 1 via RECTAL
  Filled 2012-08-16: qty 28

## 2012-08-16 MED ORDER — IBUPROFEN 600 MG PO TABS
600.0000 mg | ORAL_TABLET | Freq: Four times a day (QID) | ORAL | Status: DC
  Administered 2012-08-16 – 2012-08-17 (×7): 600 mg via ORAL
  Filled 2012-08-16 (×7): qty 1

## 2012-08-16 MED ORDER — PRENATAL MULTIVITAMIN CH
1.0000 | ORAL_TABLET | Freq: Every day | ORAL | Status: DC
  Administered 2012-08-16 – 2012-08-17 (×2): 1 via ORAL
  Filled 2012-08-16 (×2): qty 1

## 2012-08-16 MED ORDER — ZOLPIDEM TARTRATE 5 MG PO TABS: 5.0000 mg | ORAL_TABLET | Freq: Every evening | ORAL | Status: DC | PRN

## 2012-08-16 MED ORDER — INFLUENZA VIRUS VACC SPLIT PF IM SUSP
0.5000 mL | Freq: Once | INTRAMUSCULAR | Status: AC
  Administered 2012-08-16: 0.5 mL via INTRAMUSCULAR
  Filled 2012-08-16: qty 0.5

## 2012-08-16 MED ORDER — BENZOCAINE-MENTHOL 20-0.5 % EX AERO
1.0000 "application " | INHALATION_SPRAY | CUTANEOUS | Status: DC | PRN
  Administered 2012-08-16: 1 via TOPICAL
  Filled 2012-08-16: qty 56

## 2012-08-16 MED ORDER — SODIUM CHLORIDE 0.9 % IJ SOLN: 3.0000 mL | Freq: Two times a day (BID) | INTRAMUSCULAR | Status: DC

## 2012-08-16 MED ORDER — HYDROXYZINE HCL 50 MG PO TABS
50.0000 mg | ORAL_TABLET | Freq: Four times a day (QID) | ORAL | Status: DC | PRN
  Administered 2012-08-16: 50 mg via ORAL
  Filled 2012-08-16: qty 1

## 2012-08-16 MED ORDER — ONDANSETRON HCL 4 MG/2ML IJ SOLN: 4.0000 mg | INTRAMUSCULAR | Status: DC | PRN

## 2012-08-16 MED ORDER — NADOLOL 40 MG PO TABS
40.0000 mg | ORAL_TABLET | Freq: Every day | ORAL | Status: DC
  Administered 2012-08-16: 40 mg via ORAL
  Filled 2012-08-16 (×2): qty 1

## 2012-08-16 MED ORDER — OXYTOCIN 40 UNITS IN LACTATED RINGERS INFUSION - SIMPLE MED: 62.5000 mL/h | INTRAVENOUS | Status: DC | PRN

## 2012-08-16 MED ORDER — NADOLOL 40 MG PO TABS
80.0000 mg | ORAL_TABLET | Freq: Every day | ORAL | Status: DC
  Administered 2012-08-16 (×2): 80 mg via ORAL
  Filled 2012-08-16 (×2): qty 2

## 2012-08-16 MED ORDER — DIPHENHYDRAMINE HCL 25 MG PO CAPS: 25.0000 mg | ORAL_CAPSULE | Freq: Four times a day (QID) | ORAL | Status: DC | PRN

## 2012-08-16 MED ORDER — FLEET ENEMA 7-19 GM/118ML RE ENEM: 1.0000 | ENEMA | Freq: Every day | RECTAL | Status: DC | PRN

## 2012-08-16 MED ORDER — OXYCODONE-ACETAMINOPHEN 5-325 MG PO TABS: 1.0000 | ORAL_TABLET | ORAL | Status: DC | PRN

## 2012-08-16 MED ORDER — MEASLES, MUMPS & RUBELLA VAC ~~LOC~~ INJ
0.5000 mL | INJECTION | Freq: Once | SUBCUTANEOUS | Status: DC
  Filled 2012-08-16: qty 0.5

## 2012-08-16 MED ORDER — FAMOTIDINE 20 MG PO TABS
20.0000 mg | ORAL_TABLET | Freq: Every day | ORAL | Status: DC | PRN
  Filled 2012-08-16: qty 1

## 2012-08-16 MED ORDER — LANOLIN HYDROUS EX OINT: TOPICAL_OINTMENT | CUTANEOUS | Status: DC | PRN

## 2012-08-16 MED ORDER — BISACODYL 10 MG RE SUPP: 10.0000 mg | Freq: Every day | RECTAL | Status: DC | PRN

## 2012-08-16 MED ORDER — HYDROXYZINE HCL 50 MG/ML IM SOLN
50.0000 mg | Freq: Four times a day (QID) | INTRAMUSCULAR | Status: DC | PRN
  Filled 2012-08-16: qty 1

## 2012-08-16 MED ORDER — SENNOSIDES-DOCUSATE SODIUM 8.6-50 MG PO TABS
2.0000 | ORAL_TABLET | Freq: Every day | ORAL | Status: DC
  Administered 2012-08-16: 2 via ORAL

## 2012-08-16 MED ORDER — MONTELUKAST SODIUM 10 MG PO TABS
10.0000 mg | ORAL_TABLET | Freq: Every day | ORAL | Status: DC
  Administered 2012-08-16: 10 mg via ORAL
  Filled 2012-08-16 (×3): qty 1

## 2012-08-16 MED ORDER — SODIUM CHLORIDE 0.9 % IJ SOLN: 3.0000 mL | INTRAMUSCULAR | Status: DC | PRN

## 2012-08-16 MED ORDER — SODIUM CHLORIDE 0.9 % IV SOLN: 250.0000 mL | INTRAVENOUS | Status: DC | PRN

## 2012-08-16 MED ORDER — SIMETHICONE 80 MG PO CHEW: 80.0000 mg | CHEWABLE_TABLET | ORAL | Status: DC | PRN

## 2012-08-16 MED ORDER — WITCH HAZEL-GLYCERIN EX PADS
1.0000 "application " | MEDICATED_PAD | CUTANEOUS | Status: DC | PRN
  Administered 2012-08-16: 1 via TOPICAL

## 2012-08-16 MED ORDER — CYCLOBENZAPRINE HCL 10 MG PO TABS
10.0000 mg | ORAL_TABLET | Freq: Every evening | ORAL | Status: DC | PRN
  Filled 2012-08-16: qty 1

## 2012-08-16 NOTE — Progress Notes (Signed)
Post Partum Day 1 Subjective: no complaints, up ad lib, voiding and tolerating PO  Objective: Blood pressure 136/74, pulse 84, temperature 98.1 F (36.7 C), temperature source Oral, resp. rate 20, last menstrual period 11/11/2011, unknown if currently breastfeeding.  Physical Exam:  General: alert, cooperative and no distress Lochia: appropriate Uterine Fundus: firm Incision: healing well DVT Evaluation: No evidence of DVT seen on physical exam. Negative Homan's sign. No cords or calf tenderness. No significant calf/ankle edema.   Basename 08/16/12 0505 08/15/12 1841  HGB 11.7* 12.5  HCT 34.5* 37.6    Assessment/Plan: Plan for discharge tomorrow, Breastfeeding and Contraception Micronor   LOS: 1 day   Marge Duncans 08/16/2012, 6:50 AM

## 2012-08-17 ENCOUNTER — Encounter: Payer: 59 | Admitting: Physician Assistant

## 2012-08-17 ENCOUNTER — Encounter (HOSPITAL_COMMUNITY)
Admission: RE | Admit: 2012-08-17 | Discharge: 2012-08-17 | Disposition: A | Discharge status: Home / Self Care | Payer: 59 | Source: Ambulatory Visit | Attending: Obstetrics & Gynecology | Admitting: Obstetrics & Gynecology

## 2012-08-17 DIAGNOSIS — O923 Agalactia: Secondary | ICD-10-CM | POA: Insufficient documentation

## 2012-08-17 NOTE — Consult Note (Signed)
"  Rec'd call from Page Spiro, RN to make me aware that Mom is taking Corgard for migraines.  Corgard is an L4.  Corgard is not recommended for breastfeeding Moms.  Based on the half-life, it is recommended that Mom give formula at this time & pump & dump for 5 days.  I spoke w/Ann in Pharmacy. She agrees with these recommendations.   I spoke w/parents;  Mom understands, but is upset." Olivia Mackie, [place]

## 2012-08-17 NOTE — MAU Provider Note (Signed)
Agree with above note.  Keeana Pieratt 08/17/2012 10:52 AM   

## 2012-08-17 NOTE — Discharge Summary (Signed)
Obstetric Discharge Summary Reason for Admission: onset of labor Prenatal Procedures: none Intrapartum Procedures: vacuum Postpartum Procedures: none Complications-Operative and Postpartum: 2nd degree perineal laceration Hemoglobin  Date Value Range Status  08/16/2012 11.7* 12.0 - 15.0 g/dL Final     HCT  Date Value Range Status  08/16/2012 34.5* 36.0 - 46.0 % Final   Pt presented in spontaneous labor on 9/15.  At 5:51 PM a viable female was delivered via Vaginal, Vacuum (Extractor) (Presentation:Left Occiput Anterior) by Dr. Jolayne Panther.  Nuchal cord x 1, reduced before body birthed. APGAR: 8, 9; weight 6 lb 8.4 oz (2960 g).   Placenta status: intact and delivered without complications.  Cord: 3 vessels with the following complications: None.   NICU called to be in attendance of birth d/t prolonged decel to 28s after arom, and vacuum birth. 2nd degree laceration repaired with 3-0 vicryl.   Post-partum care uncomplicated. She was continued on her migraine ppx of nadolol initially but then it was determined that this transmits to breast milk. This was stopped.  Desires micronor for contraception. Breast feeding.    Physical Exam:  General: alert, cooperative and no distress Lochia: appropriate Uterine Fundus: firm Incision: na DVT Evaluation: No evidence of DVT seen on physical exam.  Discharge Diagnoses: Term Pregnancy-delivered  Discharge Information: Date: 08/17/2012 Activity: pelvic rest Diet: routine Medications: PNV, Ibuprofen and Colace Condition: stable Instructions: refer to practice specific booklet Discharge to: home   Newborn Data: Live born female  Birth Weight: 6 lb 8.4 oz (2960 g) APGAR: 8, 9  Home with mother.  Rulon Abide 08/17/2012, 6:16 AM  I examined pt and agree with documentation above and resident plan of care. [facility]

## 2012-08-17 NOTE — H&P (Signed)
Agree with above note.  Jeslyn Amsler 08/17/2012 10:52 AM   

## 2012-08-17 NOTE — Progress Notes (Signed)
On baby assessment infant experiencing bradycardiain a deep sleep with some irregularity. After stimulation rose. I had recently had a conversation with Maxine Glenn Monday at 2200 on 9/15 regarding having lactation review her medication Corgard for her migraines in babies interest due to its Level C category in [employer]. At this time I felt that the baby's assessment warranted further investigation of the medication. Hezzie Bump with Lactation was consulted at this time, approximately 2350, and did consult with pharmacy to verify research re: half life of medication and length of time in which it would reside in her breastmilk. Hezzie Bump, Mr/Mrs. Arocho and myself did have a conversation regarding baby's safety. It was stated that mom would not be able to breastfeed at this point but could pump and dump for 4-5 days until the medication has left her system. Also, discussed the possibility of other migraine medication options that could be safer for baby. Plan on notifying OB MD and pediatrician in a.m.

## 2012-08-18 ENCOUNTER — Inpatient Hospital Stay (HOSPITAL_COMMUNITY): Admit: 2012-08-18 | Payer: 59

## 2012-08-31 ENCOUNTER — Encounter: Payer: Self-pay | Admitting: Obstetrics and Gynecology

## 2012-08-31 ENCOUNTER — Ambulatory Visit (INDEPENDENT_AMBULATORY_CARE_PROVIDER_SITE_OTHER): Payer: 59 | Admitting: Obstetrics and Gynecology

## 2012-08-31 NOTE — Progress Notes (Signed)
Patient ID: Lisa Cannon, female   DOB: 1981/09/19, 31 y.o.   MRN: [id number] 31 yo G1P1001 2 weeks post vacuum-assisted vaginal delivery presents as scheduled for blood pressure check. Her blood pressure was mildly elevated at 1 week prior to delivery but during hospitalization blood pressures were in normal range. She did have A1 gestational diabetes. She has no complaints or concerns other than when she can begin having sexual intercourse. Postpartum she is doing well and has had husband at home from work until today and also her mother for help and support. She's breast-feeding without difficulty. Lochia is scant. Filed Vitals:   08/31/12 0832  BP: 135/79  Pulse: 110  Resp: 16   Normal postpartum course. Advised to wait until its comfortable to began intercourse. Followup urine a month for 6 week postpartum visit and initiation of Micronor if that is what she chooses still.  Danae Orleans, CNM 08/31/2012 9:01 AM

## 2012-08-31 NOTE — Patient Instructions (Signed)

## 2012-09-28 ENCOUNTER — Ambulatory Visit: Payer: 59

## 2012-09-28 ENCOUNTER — Ambulatory Visit (INDEPENDENT_AMBULATORY_CARE_PROVIDER_SITE_OTHER): Payer: 59 | Admitting: Advanced Practice Midwife

## 2012-09-28 ENCOUNTER — Encounter: Payer: Self-pay | Admitting: Advanced Practice Midwife

## 2012-09-28 VITALS — BP 115/75 | HR 97 | Temp 98.3°F | Resp 16 | Ht 66.0 in | Wt 183.0 lb

## 2012-09-28 DIAGNOSIS — Z3009 Encounter for other general counseling and advice on contraception: Secondary | ICD-10-CM

## 2012-09-28 MED ORDER — NORETHINDRONE 0.35 MG PO TABS: ORAL_TABLET | ORAL | Status: DC

## 2012-09-28 NOTE — Progress Notes (Signed)
  Subjective:     Lisa Cannon is a 31 y.o. female who presents for a postpartum visit. She is 6 weeks postpartum following a vacuum assisted vaginal delivery. I have fully reviewed the prenatal and intrapartum course. The delivery was at 39.5 gestational weeks. Outcome: vacuum, outlet. Anesthesia: epidural. Postpartum course has been normal. Baby's course has been normal. Baby is feeding by breast. Bleeding Lochia stopped 1 week ago, but light spotting started yesterday.. Bowel function is normal. Bladder function is normal. Pt had 2nd degree laceration with repair.  She reports no pain at area of incision.  Patient is sexually active without difficulty. Contraception method is oral progesterone-only contraceptive. Postpartum depression screening: negative.  The following portions of the patient's history were reviewed and updated as appropriate: allergies, current medications, past family history, past medical history, past social history, past surgical history and problem list.  Review of Systems A comprehensive review of systems was negative.   Exception: pt has migraines and has had 2 since delivery, which is less than before pregnancy. She is managing these well.  Objective:    BP 115/75  Pulse 97  Temp 98.3 F (36.8 C) (Oral)  Resp 16  Ht 5\' 6"  (1.676 m)  Wt 183 lb (83.008 kg)  BMI 29.54 kg/m2  LMP 09/25/2012  Breastfeeding? Yes  General:  alert, cooperative and no distress   Breasts:  deferred  Lungs: clear to auscultation bilaterally  Heart:  regular rate and rhythm, S1, S2 normal, no murmur, click, rub or gallop  Abdomen: deferred   Vulva:  not evaluated  Vagina: not evaluated  Cervix:  not evaluated  Corpus: not examined  Adnexa:  not evaluated  Rectal Exam: Not performed.        Assessment:    Normal postpartum exam. Pap smear not done at today's visit.  Done in 2013 prior to pregnancy.   Plan:    1. Contraception: oral progesterone-only contraceptive 2.  Prescription for Micronor sent to pharmacy 3. Follow up in: 1 year or as needed.

## 2012-10-14 ENCOUNTER — Telehealth: Payer: Self-pay | Admitting: *Deleted

## 2012-10-14 NOTE — Telephone Encounter (Signed)
Patient called wants to know if she can have a referral to Podiatry for in-grown toenails or does she need a OV with you for the referral.

## 2012-10-14 NOTE — Telephone Encounter (Signed)
I can make the referral if she would like.  I also treat ingrown toenails, so which ever she prefers.

## 2012-10-19 ENCOUNTER — Encounter: Payer: Self-pay | Admitting: Family Medicine

## 2012-10-19 ENCOUNTER — Ambulatory Visit (INDEPENDENT_AMBULATORY_CARE_PROVIDER_SITE_OTHER): Payer: 59 | Admitting: Family Medicine

## 2012-10-19 VITALS — BP 131/82 | HR 97 | Ht 66.0 in | Wt 182.0 lb

## 2012-10-19 DIAGNOSIS — L600 Ingrowing nail: Secondary | ICD-10-CM

## 2012-10-19 NOTE — Patient Instructions (Signed)
Infected Ingrown Toenail  An infected ingrown toenail occurs when the nail edge grows into the skin and bacteria invade the area. Symptoms include pain, tenderness, swelling, and pus drainage from the edge of the nail. Poorly fitting shoes, minor injuries, and improper cutting of the toenail may also contribute to the problem. You should cut your toenails squarely instead of rounding the edges. Do not cut them too short. Avoid tight or pointed toe shoes. Sometimes the ingrown portion of the nail must be removed. If your toenail is removed, it can take 3-4 months for it to re-grow.  HOME CARE INSTRUCTIONS    Soak your infected toe in warm water for 20-30 minutes, 2 to 3 times a day.   Packing or dressings applied to the area should be changed daily.   Take medicine as directed and finish them.   Reduce activities and keep your foot elevated when able to reduce swelling and discomfort. Do this until the infection gets better.   Wear sandals or go barefoot as much as possible while the infected area is sensitive.   See your caregiver for follow-up care in 2-3 days if the infection is not better.  SEEK MEDICAL CARE IF:   Your toe is becoming more red, swollen or painful.  MAKE SURE YOU:    Understand these instructions.   Will watch your condition.   Will get help right away if you are not doing well or get worse.  Document Released: 12/26/2004 Document Revised: 02/10/2012 Document Reviewed: 11/14/2008  [employer] Patient Information 2013 [employer].

## 2012-10-19 NOTE — Progress Notes (Signed)
  Subjective:    Patient ID: Lisa Cannon, female    DOB: 02-Mar-1981, 31 y.o.   MRN: [id number]  HPI  She's here today to discuss ingrown toenails. The end of her pregnancy she started getting pedicures. Ever since then she's had problems with ingrown toenails on both big toes and both middle toes on her feet. She says it never got to the point where they were draining or had any pus. They usually just a little red and sore. If she goes and gets them turnaround N/A feel better for about a week or so and then they start to get sore again. She hasn't been unable to resolve her dress shoes because of discomfort.  Review of Systems     Objective:   Physical Exam  Constitutional: She appears well-developed and well-nourished.  Musculoskeletal:       Toenails appear normal. No dystrophic changes. On her middle toes on her right foot I can see a wedge cut out from the side of the nail. There is no active drainage or infection. No significant erythema there but it is tender.  Skin: Skin is warm and dry.  Psychiatric: She has a normal mood and affect. Her behavior is normal.          Assessment & Plan:  Ingrown toenails-we did discuss treatment. Right now she does not have any signs of active infection. She does not need a toenail removal today. We discussed the option of actually doing a partial removal versus actually placing a little bit of cotton under the edge of the nail border which requires anesthesia as well. We also discussed limiting her toenails grow out over the next 3-4 months and then cutting them straight across. I recommend not lifting her nail technician cut the sides of the nail. They definitely need to be cut straight across to see if she can get out of this cycle with her toenails. If she continues to be prone to them that I recommend partial with removal with destruction of part of the matrix.  Also rec salt soaks. H.O given.

## 2012-11-03 ENCOUNTER — Ambulatory Visit: Payer: 59 | Admitting: Obstetrics & Gynecology

## 2013-10-05 ENCOUNTER — Other Ambulatory Visit: Payer: Self-pay | Admitting: *Deleted

## 2013-10-05 MED ORDER — NORETHINDRONE 0.35 MG PO TABS: ORAL_TABLET | ORAL | Status: DC

## 2013-12-16 ENCOUNTER — Ambulatory Visit (INDEPENDENT_AMBULATORY_CARE_PROVIDER_SITE_OTHER): Payer: 59 | Admitting: Family Medicine

## 2013-12-16 VITALS — BP 133/84 | HR 87 | Temp 98.1°F | Wt 175.0 lb

## 2013-12-16 DIAGNOSIS — R109 Unspecified abdominal pain: Secondary | ICD-10-CM

## 2013-12-16 LAB — POCT URINALYSIS DIPSTICK
Bilirubin, UA: NEGATIVE
Blood, UA: NEGATIVE
GLUCOSE UA: NEGATIVE
KETONES UA: NEGATIVE
LEUKOCYTES UA: NEGATIVE
Nitrite, UA: NEGATIVE
Protein, UA: NEGATIVE
SPEC GRAV UA: 1.025
UROBILINOGEN UA: 0.2
pH, UA: 7

## 2013-12-16 MED ORDER — CIPROFLOXACIN HCL 500 MG PO TABS: 500.0000 mg | ORAL_TABLET | Freq: Two times a day (BID) | ORAL | Status: AC

## 2013-12-16 NOTE — Progress Notes (Signed)
   Subjective:    Patient ID: Lisa Cannon, female    DOB: 1981-01-08, 33 y.o.   MRN: [id number] Called pt to give her results & she states that she does have some slight burning & she constantly feels like she needs to urinate.  Beatris Ship, CMA HPI    Review of Systems     Objective:   Physical Exam        Assessment & Plan:

## 2013-12-16 NOTE — Progress Notes (Signed)
   Subjective:    Patient ID: Lisa Cannon, female    DOB: Dec 24, 1980, 33 y.o.   MRN: [id number]  HPI   Pt c/o abdominal pressure since yesterday. Denies any other sxs  Review of Systems     Objective:   Physical Exam        Assessment & Plan:  ABd Pain - UA is neg. Will send for culture. If getting worse then let me know.  Or if starts having dysuria then let me know.

## 2013-12-18 LAB — URINE CULTURE

## 2014-02-18 ENCOUNTER — Telehealth: Payer: Self-pay | Admitting: *Deleted

## 2014-02-18 DIAGNOSIS — G43009 Migraine without aura, not intractable, without status migrainosus: Secondary | ICD-10-CM

## 2014-02-18 NOTE — Telephone Encounter (Signed)
Pt stated that she would like a referral to a neurologist in either [place] or [place]

## 2014-02-25 NOTE — Telephone Encounter (Signed)
Referral placed.Lisa Cannon Lynetta  

## 2014-04-04 ENCOUNTER — Ambulatory Visit (INDEPENDENT_AMBULATORY_CARE_PROVIDER_SITE_OTHER): Payer: 59 | Admitting: Neurology

## 2014-04-04 ENCOUNTER — Encounter: Payer: Self-pay | Admitting: Neurology

## 2014-04-04 VITALS — BP 130/80 | HR 107 | Temp 98.1°F | Ht 66.0 in | Wt 176.1 lb

## 2014-04-04 DIAGNOSIS — G43009 Migraine without aura, not intractable, without status migrainosus: Secondary | ICD-10-CM

## 2014-04-04 MED ORDER — SUMATRIPTAN SUCCINATE 100 MG PO TABS: ORAL_TABLET | ORAL | Status: DC

## 2014-04-04 NOTE — Progress Notes (Signed)
NEUROLOGY CONSULTATION NOTE  Lisa Cannon MRN: [id number] DOB: Apr 28, 1981  Referring provider: Dr. Beatrice Lecher Primary care provider: Dr. Beatrice Lecher  Reason for consult:  Establish care for migraines  Dear Dr Madilyn Fireman:  Thank you for your kind referral of [facility] for consultation of the above symptoms. Although her history is well known to you, please allow me to reiterate it for the purpose of our medical record. Records and images were personally reviewed where available.  HISTORY OF PRESENT ILLNESS: This is a very pleasant 33 year old right-handed woman with no significant past medical history, presenting to establish care for her migraines. She was previously seeing Dr. Berdine Addison in [place] before they moved to [place].  She started having migraines in her pre-teen years.  This increased in frequency and severity when she was in college, with several migraines every week.  Headaches are usually over the left frontal and retroorbital region, at times radiating to the left side of her neck.  This is associated with nausea, dizziness, tightening in her jaw area, and mild photo/phonophobia.  No migraine auras.  She was initially started on Topamax which caused more headaches and nausea.  Butalbital does not help.  She was switched to Corgard (Nadolol) 5 years ago, which significantly helped reduce headaches to once a month, usually a day before she would start her menstrual period.  She stopped the medication 1-1/2 years ago when she became pregnant with her daughter.  Since then, she would have 1 migraine/month on average, with good response to Imitrex.  last migraine was 3 weeks ago. Migraine triggers include lack of sleep and allergies.  She denies any diplopia, dysarthria, dysphagia, focal numbness/tingling/weakness, back pain, bowel or bladder dysfunction.  Her father and possibly paternal grandmother had migraines.    PAST MEDICAL HISTORY: Past Medical History   Diagnosis Date  . Migraines   . Abnormal Pap smear of cervix     PAST SURGICAL HISTORY: Past Surgical History  Procedure Laterality Date  . Cervical biopsy  w/ loop electrode excision  2002  . Wisdom tooth extraction      MEDICATIONS: Current Outpatient Prescriptions on File Prior to Visit  Medication Sig Dispense Refill  . FENUGREEK PO Take by mouth.      . Prenatal Vit-Fe Fumarate-FA (PRENATAL MULTIVITAMIN) TABS Take 1 tablet by mouth every morning.       No current facility-administered medications on file prior to visit.    ALLERGIES: No Known Allergies  FAMILY HISTORY: Family History  Problem Relation Age of Onset  . Diabetes Maternal Aunt   . Hypertension Maternal Aunt   . Hypertension Maternal Grandfather   . Cancer Paternal Grandfather     lung cancer  . Cancer Paternal Grandmother     brain    SOCIAL HISTORY: History   Social History  . Marital Status: Married    Spouse Name: N/A    Number of Children: N/A  . Years of Education: N/A   Occupational History  . homemaker    Social History Main Topics  . Smoking status: Never Smoker   . Smokeless tobacco: Never Used  . Alcohol Use: No  . Drug Use: No  . Sexual Activity: Yes    Partners: Male     Comment: trying to conceive   Other Topics Concern  . Not on file   Social History Narrative  . No narrative on file    REVIEW OF SYSTEMS: Constitutional: No fevers, chills, or sweats, no generalized fatigue,  change in appetite Eyes: No visual changes, double vision, eye pain Ear, nose and throat: No hearing loss, ear pain, nasal congestion, sore throat Cardiovascular: No chest pain, palpitations Respiratory:  No shortness of breath at rest or with exertion, wheezes GastrointestinaI: No nausea, vomiting, diarrhea, abdominal pain, fecal incontinence Genitourinary:  No dysuria, urinary retention or frequency Musculoskeletal:  No neck pain, back pain Integumentary: No rash, pruritus, skin  lesions Neurological: as above Psychiatric: No depression, insomnia, anxiety Endocrine: No palpitations, fatigue, diaphoresis, mood swings, change in appetite, change in weight, increased thirst Hematologic/Lymphatic:  No anemia, purpura, petechiae. Allergic/Immunologic: no itchy/runny eyes, nasal congestion, recent allergic reactions, rashes  PHYSICAL EXAM: Filed Vitals:   04/04/14 1347  BP: 130/80  Pulse: 107  Temp: 98.1 F (36.7 C)   General: No acute distress Head:  Normocephalic/atraumatic Neck: supple, no paraspinal tenderness, full range of motion Back: No paraspinal tenderness Heart: regular rate and rhythm Lungs: Clear to auscultation bilaterally. Vascular: No carotid bruits. Skin/Extremities: No rash, no edema Neurological Exam: Mental status: alert and oriented to person, place, and time, no dysarthria or aphasia, Fund of knowledge is appropriate.  Recent and remote memory are intact.  Attention and concentration are normal.    Able to name objects and repeat phrases. Cranial nerves: CN I: not tested CN II: pupils equal, round and reactive to light, visual fields intact, fundi unremarkable. CN III, IV, VI:  full range of motion, no nystagmus, no ptosis CN V: facial sensation intact CN VII: upper and lower face symmetric CN VIII: hearing intact CN IX, X: gag intact, uvula midline CN XI: sternocleidomastoid and trapezius muscles intact CN XII: tongue midline Bulk & Tone: normal, no fasciculations. Motor: 5/5 throughout with no pronator drift. Sensation: intact to light touch, cold, pin, vibration and joint position sense.  No extinction to double simultaneous stimulation.  Romberg test negative Deep Tendon Reflexes: +2 throughout, no ankle clonus Plantar responses: downgoing bilaterally Cerebellar: no incoordination on finger to nose, heel to shin. No dysdiadochokinesia Gait: narrow-based and steady, able to tandem walk adequately. 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  She has around 1 migraine a month.  We discussed continuing rescue medication with Imitrex.  We discussed that at this time there is no indication for starting a daily headache preventative medication, she will keep a headache diary and knows to call our office if symptoms worsen, at which point restarting nadolol which helped in the past will be done.  Records from her prior neurologist will be requested for review.  She will follow-up in 6 months.  Thank you for allowing me to participate in the care of this patient. Please do not hesitate to call for any questions or concerns.   Ellouise Newer, M.D.

## 2014-04-04 NOTE — Patient Instructions (Signed)
1. Take sumatriptan as needed at onset of headache 2. Call our office for any problems

## 2014-09-23 ENCOUNTER — Ambulatory Visit (INDEPENDENT_AMBULATORY_CARE_PROVIDER_SITE_OTHER): Payer: 59 | Admitting: Advanced Practice Midwife

## 2014-09-23 ENCOUNTER — Encounter: Payer: Self-pay | Admitting: Advanced Practice Midwife

## 2014-09-23 VITALS — BP 138/86 | HR 108 | Resp 16 | Ht 66.0 in | Wt 188.0 lb

## 2014-09-23 DIAGNOSIS — Z01419 Encounter for gynecological examination (general) (routine) without abnormal findings: Secondary | ICD-10-CM

## 2014-09-23 DIAGNOSIS — L680 Hirsutism: Secondary | ICD-10-CM

## 2014-09-23 DIAGNOSIS — Z124 Encounter for screening for malignant neoplasm of cervix: Secondary | ICD-10-CM

## 2014-09-23 DIAGNOSIS — Z3041 Encounter for surveillance of contraceptive pills: Secondary | ICD-10-CM

## 2014-09-23 DIAGNOSIS — Z1151 Encounter for screening for human papillomavirus (HPV): Secondary | ICD-10-CM

## 2014-09-23 MED ORDER — NORGESTIMATE-ETH ESTRADIOL 0.25-35 MG-MCG PO TABS: 1.0000 | ORAL_TABLET | Freq: Every day | ORAL | Status: DC

## 2014-09-23 NOTE — Progress Notes (Signed)
Subjective:     Lisa Cannon is a 33 y.o. female here for a routine exam.  Current complaints: increased facial hair, symptoms of high testosterone.  She reports high testosterone lab in 2012, used Clomid to ovulate for pregnancy.  She also reports increased PMS symptoms recently with irritability lasting several days before onset of menses.    Gynecologic History Patient's last menstrual period was 09/02/2014. Contraception: oral progesterone-only contraceptive Last Pap: 2012. Results were: normal Last mammogram: n/a.   Obstetric History OB History  Gravida Para Term Preterm AB SAB TAB Ectopic Multiple Living  1 1 1  0 0 0 0 0 0 1    # Outcome Date GA Lbr Len/2nd Weight Sex Delivery Anes PTL Lv  1 TRM 08/15/12 [redacted]w[redacted]d 09:33 / 00:18 6 lb 8.4 oz (2.96 kg) F VAC Local  Y       The following portions of the patient's history were reviewed and updated as appropriate: allergies, current medications, past family history, past medical history, past social history, past surgical history and problem list.  Review of Systems A comprehensive review of systems was negative.    Objective:   Physical Examination: General appearance - alert, well appearing, and in no distress and oriented to person, place, and time Neck - supple, no significant adenopathy, thyroid exam: thyroid is normal in size without nodules or tenderness Chest - clear to auscultation, no wheezes, rales or rhonchi, symmetric air entry Heart - normal rate, regular rhythm, normal S1, S2, no murmurs, rubs, clicks or gallops Abdomen - soft, nontender, nondistended, no masses or organomegaly Breasts - breasts appear normal, no suspicious masses, no skin or nipple changes or axillary nodes Pelvic - normal external genitalia, vulva, vagina, cervix, uterus and adnexa Neurological - alert, oriented, normal speech, no focal findings or movement disorder noted Musculoskeletal - no joint tenderness, deformity or swelling, full range of  motion without pain Skin - normal coloration and turgor, no rashes, no suspicious skin lesions noted    Assessment:    Healthy female exam Pap done today    Plan:    Education reviewed: self breast exams and contraceptive choices. Switched from [employer] to [employer].  Discussed LARCs, including Mirena IUD. Pt interested in Mirena but wants to see if estrogen in pill will help high testosterone symptoms and symptoms of PMS.  Market researcher given about IUD. F/U PRN for change in contraception or annually for well-woman exam

## 2014-09-26 LAB — CYTOLOGY - PAP

## 2014-09-26 LAB — TESTOSTERONE, FREE, TOTAL, SHBG
SEX HORMONE BINDING: 18 nmol/L (ref 18–114)
TESTOSTERONE FREE: 5.3 pg/mL (ref 0.6–6.8)
Testosterone-% Free: 2.4 % (ref 0.4–2.4)
Testosterone: 22 ng/dL (ref 10–70)

## 2014-09-27 ENCOUNTER — Telehealth: Payer: Self-pay | Admitting: *Deleted

## 2014-09-27 NOTE — Telephone Encounter (Signed)
Called pt to adv labs/PAP was nrml. LMOM for pt to rtn call if needed

## 2014-10-03 ENCOUNTER — Encounter: Payer: Self-pay | Admitting: Advanced Practice Midwife

## 2014-12-05 ENCOUNTER — Ambulatory Visit (INDEPENDENT_AMBULATORY_CARE_PROVIDER_SITE_OTHER): Payer: 59 | Admitting: Neurology

## 2014-12-05 ENCOUNTER — Encounter: Payer: Self-pay | Admitting: Neurology

## 2014-12-05 VITALS — BP 140/88 | HR 105 | Resp 16 | Ht 66.0 in | Wt 198.0 lb

## 2014-12-05 DIAGNOSIS — M542 Cervicalgia: Secondary | ICD-10-CM

## 2014-12-05 DIAGNOSIS — G43009 Migraine without aura, not intractable, without status migrainosus: Secondary | ICD-10-CM

## 2014-12-05 MED ORDER — NADOLOL 20 MG PO TABS
ORAL_TABLET | ORAL | Status: DC
Start: 2014-12-05 — End: 2015-03-07

## 2014-12-05 MED ORDER — CYCLOBENZAPRINE HCL 5 MG PO TABS: ORAL_TABLET | ORAL | Status: DC

## 2014-12-05 NOTE — Patient Instructions (Signed)
1. Start Nadolol 20mg : Take 1 tablet at bedtime for 2 weeks, then increase to 2 tablets at bedtime 2. May use Flexeril 5mg  at bedtime as needed for neck pain 3. Minimize Imitrex intake to 2 a week to avoid rebound headaches 4. Keep a headache diary 5. Continue with plan for weight loss, exercise, proper diet 6. Follow-up in 3 months

## 2014-12-05 NOTE — Progress Notes (Signed)
NEUROLOGY FOLLOW UP OFFICE NOTE  Lisa Cannon [id number]  HISTORY OF PRESENT ILLNESS: I had the pleasure of seeing Lisa Cannon in follow-up in the neurology clinic on 12/05/2014.  The patient was last seen 8 months ago for episodic migraines. At that time she reported 1 migraine a month with good response to Imitrex. She reports that shortly thereafter, headache frequency started increasing back to pre-pregnancy frequency of 3-4 times a week, with 7 to 10/10 pain with associated vomiting. Headaches are worse around the time of her menstrual period. She continues to breastfeed and has been hesitant to restart Nadolol, which she had good response to in the past. Her daughter is now 36 years old and Lisa Cannon is ready to stop breastfeeding.  She had also been taking Flexeril for neck pain at bedtime. She has chronic neck pain. She denies any dizziness, vision changes or visual obscuration, focal numbness/tingling/weakness.   HPI: This is a very pleasant 34 yo RH woman with a history of migraines since her pre-teen years. This increased in frequency and severity when she was in college, with several migraines every week. Headaches are usually over the left frontal and retroorbital region, at times radiating to the left side of her neck. This is associated with nausea, dizziness, tightening in her jaw area, and mild photo/phonophobia. No migraine auras. She was initially started on Topamax which caused more headaches and nausea. Butalbital does not help. She was switched to Corgard (Nadolol) 5 years ago, which significantly helped reduce headaches to once a month, usually a day before she would start her menstrual period. She stopped the medication 1-1/2 years ago when she became pregnant with her daughter. Since then, she would have 1 migraine/month on average, with good response to Imitrex.Migraine triggers include lack of sleep and allergies. Her father and possibly paternal grandmother had  migraines.   PAST MEDICAL HISTORY: Past Medical History  Diagnosis Date  . Migraines   . Abnormal Pap smear of cervix     MEDICATIONS: Current Outpatient Prescriptions on File Prior to Visit  Medication Sig Dispense Refill  . norgestimate-ethinyl estradiol (ORTHO-CYCLEN,SPRINTEC,PREVIFEM) 0.25-35 MG-MCG tablet Take 1 tablet by mouth daily. 1 Package 11  . SUMAtriptan (IMITREX) 100 MG tablet Take 100 mg by mouth at onset of headache. May repeat dose after 2 hours. Do not take more than 2/week. 10 tablet 11   No current facility-administered medications on file prior to visit.    ALLERGIES: No Known Allergies  FAMILY HISTORY: Family History  Problem Relation Age of Onset  . Diabetes Maternal Aunt   . Hypertension Maternal Aunt   . Hypertension Maternal Grandfather   . Cancer Paternal Grandfather     lung cancer  . Cancer Paternal Grandmother     brain    SOCIAL HISTORY: History   Social History  . Marital Status: Married    Spouse Name: N/A    Number of Children: N/A  . Years of Education: N/A   Occupational History  . homemaker    Social History Main Topics  . Smoking status: Never Smoker   . Smokeless tobacco: Never Used  . Alcohol Use: No  . Drug Use: No  . Sexual Activity:    Partners: Male    Birth Control/ Protection: Pill     Comment: trying to conceive   Other Topics Concern  . Not on file   Social History Narrative    REVIEW OF SYSTEMS: Constitutional: No fevers, chills, or sweats, no generalized fatigue, change in  appetite Eyes: No visual changes, double vision, eye pain Ear, nose and throat: No hearing loss, ear pain, nasal congestion, sore throat Cardiovascular: No chest pain, palpitations Respiratory:  No shortness of breath at rest or with exertion, wheezes GastrointestinaI: No nausea, vomiting, diarrhea, abdominal pain, fecal incontinence Genitourinary:  No dysuria, urinary retention or frequency Musculoskeletal:  + neck pain,no back  pain Integumentary: No rash, pruritus, skin lesions Neurological: as above Psychiatric: No depression, insomnia, anxiety Endocrine: No palpitations, fatigue, diaphoresis, mood swings, change in appetite, change in weight, increased thirst Hematologic/Lymphatic:  No anemia, purpura, petechiae. Allergic/Immunologic: no itchy/runny eyes, nasal congestion, recent allergic reactions, rashes  PHYSICAL EXAM: Filed Vitals:   12/05/14 1517  BP: 140/88  Pulse: 105  Resp: 16   General: No acute distress Head:  Normocephalic/atraumatic Neck: supple, no paraspinal tenderness, full range of motion Heart:  Regular rate and rhythm Lungs:  Clear to auscultation bilaterally Back: No paraspinal tenderness Skin/Extremities: No rash, no edema Neurological Exam: alert and oriented to person, place, and time. No aphasia or dysarthria. Fund of knowledge is appropriate.  Recent and remote memory are intact.  Attention and concentration are normal.    Able to name objects and repeat phrases. Cranial nerves: Pupils equal, round, reactive to light.  Fundoscopic exam unremarkable, no papilledema. Extraocular movements intact with no nystagmus. Visual fields full. Facial sensation intact. No facial asymmetry. Tongue, uvula, palate midline.  Motor: Bulk and tone normal, muscle strength 5/5 throughout with no pronator drift.  Sensation to light touch intact.  No extinction to double simultaneous stimulation.  Deep tendon reflexes 2+ throughout, toes downgoing.  Finger to nose testing intact.  Gait narrow-based and steady, able to tandem walk adequately.  Romberg negative.  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 She is interested in resuming Nadolol, which had good effect prior to her pregnancy. She will restart Nadolol 20mg  qhs x 2 weeks, then increase to 40mg  qhs. Side effects were discussed.  She may use prn Flexeril for neck pain. Continue prn Imitrex for migraine rescue, she knows to minimize rescue medications to 2-3 a week to avoid rebound headaches. She will continue with plan for weight loss, keep headache diary. She will follow-up in 3 months.  Thank you for allowing me to participate in her care.  Please do not hesitate to call for any questions or concerns.  The duration of this appointment visit was 25 minutes of face-to-face time with the patient.  Greater than 50% of this time was spent in counseling, explanation of diagnosis, planning of further management, and coordination of care.   Ellouise Newer, M.D.   CC: Dr. Madilyn Fireman

## 2014-12-09 ENCOUNTER — Encounter: Payer: Self-pay | Admitting: Neurology

## 2014-12-26 ENCOUNTER — Telehealth: Payer: Self-pay | Admitting: Neurology

## 2014-12-26 NOTE — Telephone Encounter (Signed)
It is still early, would give medication some time to work. We may increase dose in future if needed. We only say a medication is a failure if she has been on a good dose for 2 months. Before considering Botox, she needs to have tried other medications first. Also, pls remind her to minimize intake of Advil/Tylenol/Ibuprofen to 2-3 times a week, otherwise headaches will be harder to treat. Thanks

## 2014-12-26 NOTE — Telephone Encounter (Signed)
She states that she hasn't had any improvement on the meds. She states that headaches that have actually gotten worse. She states she doesn't know if she hasn't been on med long enough, she is up to taking the 2 pills daily. She also mentioned  Botox & if you thought that would be a good option for her.

## 2014-12-26 NOTE — Telephone Encounter (Signed)
Pt has some questions and would like to talk to tiffany please call [phone]

## 2014-12-26 NOTE — Telephone Encounter (Signed)
Lmovm

## 2014-12-27 NOTE — Telephone Encounter (Signed)
Patient returned my call. Notified her of advisement.

## 2015-03-07 ENCOUNTER — Encounter: Payer: Self-pay | Admitting: Neurology

## 2015-03-07 ENCOUNTER — Ambulatory Visit (INDEPENDENT_AMBULATORY_CARE_PROVIDER_SITE_OTHER): Payer: 59 | Admitting: Neurology

## 2015-03-07 VITALS — BP 110/78 | HR 83 | Resp 16 | Ht 66.0 in | Wt 196.0 lb

## 2015-03-07 DIAGNOSIS — G43009 Migraine without aura, not intractable, without status migrainosus: Secondary | ICD-10-CM | POA: Diagnosis not present

## 2015-03-07 DIAGNOSIS — G43709 Chronic migraine without aura, not intractable, without status migrainosus: Secondary | ICD-10-CM | POA: Insufficient documentation

## 2015-03-07 DIAGNOSIS — M542 Cervicalgia: Secondary | ICD-10-CM | POA: Diagnosis not present

## 2015-03-07 MED ORDER — NADOLOL 20 MG PO TABS
ORAL_TABLET | ORAL | Status: DC
Start: 2015-03-07 — End: 2015-06-06

## 2015-03-07 MED ORDER — CYCLOBENZAPRINE HCL 5 MG PO TABS
ORAL_TABLET | ORAL | Status: DC
Start: 2015-03-07 — End: 2015-09-05

## 2015-03-07 MED ORDER — SUMATRIPTAN SUCCINATE 100 MG PO TABS: ORAL_TABLET | ORAL | Status: DC

## 2015-03-07 NOTE — Progress Notes (Signed)
NEUROLOGY FOLLOW UP OFFICE NOTE  Lisa Cannon [id number]  HISTORY OF PRESENT ILLNESS: I had the pleasure of seeing Lisa Cannon in follow-up in the neurology clinic on 03/07/2015.  The patient was last seen 3 months ago for worsening migraines. Since her last visit, she has started Nadolol, which she had good response to in the past. She reports that the migraines are better, they are less intense, instead of occurring 5 days a week, she has them twice a week, sometimes occurring in clusters for 2 days. No nausea/vomiting, some photophobia. She has a catamenial component, with more migraines around the time of her menstrual period. Imitrex and Flexeril prn help. She always has neck pain. She usually gets 8-9 hours of sleep but always feels tired, unsure if this is due to medication or taking care of her young child. She denies any dizziness on the nadolol. She denies any vision changes, no focal numbness/tingling/weakness.   HPI: This is a very pleasant 34 yo RH woman with a history of migraines since her pre-teen years. This increased in frequency and severity when she was in college, with several migraines every week. Headaches are usually over the left frontal and retroorbital region, at times radiating to the left side of her neck. This is associated with nausea, dizziness, tightening in her jaw area, and mild photo/phonophobia. No migraine auras. She was initially started on Topamax which caused more headaches and nausea. Butalbital does not help. She was switched to Corgard (Nadolol) 5 years ago, which significantly helped reduce headaches to once a month, usually a day before she would start her menstrual period. She stopped the medication when she became pregnant with her daughter. Since then, she would have 1 migraine/month on average, with good response to Imitrex.Migraine triggers include lack of sleep and allergies. Her father and possibly paternal grandmother had migraines.     PAST MEDICAL HISTORY: Past Medical History  Diagnosis Date  . Migraines   . Abnormal Pap smear of cervix     MEDICATIONS: Current Outpatient Prescriptions on File Prior to Visit  Medication Sig Dispense Refill  . cyclobenzaprine (FLEXERIL) 5 MG tablet Take 1 tablet at bedtime as needed for neck pain 30 tablet 5  . nadolol (CORGARD) 20 MG tablet Take 1 tablet at bedtime for 2 weeks, then increase to 2 tablets at bedtime (Patient taking differently: Take 2 tablets at bedtime) 60 tablet 6  . norgestimate-ethinyl estradiol (ORTHO-CYCLEN,SPRINTEC,PREVIFEM) 0.25-35 MG-MCG tablet Take 1 tablet by mouth daily. 1 Package 11  . SUMAtriptan (IMITREX) 100 MG tablet Take 100 mg by mouth at onset of headache. May repeat dose after 2 hours. Do not take more than 2/week. 10 tablet 11   No current facility-administered medications on file prior to visit.    ALLERGIES: No Known Allergies  FAMILY HISTORY: Family History  Problem Relation Age of Onset  . Diabetes Maternal Aunt   . Hypertension Maternal Aunt   . Hypertension Maternal Grandfather   . Cancer Paternal Grandfather     lung cancer  . Cancer Paternal Grandmother     brain    SOCIAL HISTORY: History   Social History  . Marital Status: Married    Spouse Name: N/A  . Number of Children: N/A  . Years of Education: N/A   Occupational History  . homemaker    Social History Main Topics  . Smoking status: Never Smoker   . Smokeless tobacco: Never Used  . Alcohol Use: No  . Drug Use: No  .  Sexual Activity:    Partners: Male    Birth Control/ Protection: Pill     Comment: trying to conceive   Other Topics Concern  . Not on file   Social History Narrative    REVIEW OF SYSTEMS: Constitutional: No fevers, chills, or sweats, no generalized fatigue, change in appetite Eyes: No visual changes, double vision, eye pain Ear, nose and throat: No hearing loss, ear pain, nasal congestion, sore throat Cardiovascular: No chest  pain, palpitations Respiratory:  No shortness of breath at rest or with exertion, wheezes GastrointestinaI: No nausea, vomiting, diarrhea, abdominal pain, fecal incontinence Genitourinary:  No dysuria, urinary retention or frequency Musculoskeletal:  + neck pain, no back pain Integumentary: No rash, pruritus, skin lesions Neurological: as above Psychiatric: No depression, insomnia, anxiety Endocrine: No palpitations, fatigue, diaphoresis, mood swings, change in appetite, change in weight, increased thirst Hematologic/Lymphatic:  No anemia, purpura, petechiae. Allergic/Immunologic: no itchy/runny eyes, nasal congestion, recent allergic reactions, rashes  PHYSICAL EXAM: Filed Vitals:   03/07/15 1507  BP: 110/78  Pulse: 83  Resp: 16   General: No acute distress Head:  Normocephalic/atraumatic Neck: supple, no paraspinal tenderness, full range of motion Heart:  Regular rate and rhythm Lungs:  Clear to auscultation bilaterally Back: No paraspinal tenderness Skin/Extremities: No rash, no edema Neurological Exam: alert and oriented to person, place, and time. No aphasia or dysarthria. Fund of knowledge is appropriate.  Recent and remote memory are intact.  Attention and concentration are normal.    Able to name objects and repeat phrases. Cranial nerves: Pupils equal, round, reactive to light.  Fundoscopic exam unremarkable, no papilledema. Extraocular movements intact with no nystagmus. Visual fields full. Facial sensation intact. No facial asymmetry. Tongue, uvula, palate midline.  Motor: Bulk and tone normal, muscle strength 5/5 throughout with no pronator drift.  Sensation to light touch intact.  No extinction to double simultaneous stimulation.  Deep tendon reflexes 2+ throughout, toes downgoing.  Finger to nose testing intact.  Gait narrow-based and steady, able to tandem walk adequately.  Romberg negative.  IMPRESSION: This is a very pleasant 34 yo RH woman with a episodic migraines  without aura, with catamenial component that had increased in frequency. She has had some response to restarting Nadolol, currently at 40mg  with 2 migraines a week. She will increase dose to 50mg /day, continue to monitor for side effects. We discussed other options such as addition of nortriptyline. We also discussed her neck pain, which may be contributing to the headaches, she would benefit from physical therapy for myofascial release but does not have time to do it right now. She will continue prn Flexeril for neck pain. Continue prn Imitrex for migraine rescue, she knows to minimize rescue medications to 2-3 a week to avoid rebound headaches. She has catamenial migraines and will discuss taking her OCPs on a continuous basis (tri-cycling), this may reduce migraine attacks around her period. She will keep a headache diary and follow-up in 3 months.  Thank you for allowing me to participate in her care.  Please do not hesitate to call for any questions or concerns.  The duration of this appointment visit was 25 minutes of face-to-face time with the patient.  Greater than 50% of this time was spent in counseling, explanation of diagnosis, planning of further management, and coordination of care.   Ellouise Newer, M.D.   CC: Dr. Madilyn Fireman

## 2015-03-07 NOTE — Patient Instructions (Signed)
1. Increase Nadolol 20mg : Take 2-1/2 tablets daily 2. Continue Imitrex and Flexeril as needed 3. Consider physical therapy for neck pain 4. Discuss tri-cycling of your birth control with your OB to hopefully help with migraines around the time of your period 5. Keep a calendar of your headaches 6. Follow-up in 3 months

## 2015-03-21 ENCOUNTER — Encounter: Payer: Self-pay | Admitting: Obstetrics & Gynecology

## 2015-03-21 ENCOUNTER — Ambulatory Visit (INDEPENDENT_AMBULATORY_CARE_PROVIDER_SITE_OTHER): Payer: 59 | Admitting: Obstetrics & Gynecology

## 2015-03-21 VITALS — BP 119/78 | HR 79 | Resp 16 | Ht 66.0 in | Wt 201.0 lb

## 2015-03-21 DIAGNOSIS — G43009 Migraine without aura, not intractable, without status migrainosus: Secondary | ICD-10-CM

## 2015-03-21 MED ORDER — LEVONORGEST-ETH ESTRAD 91-DAY 0.15-0.03 &0.01 MG PO TABS: 1.0000 | ORAL_TABLET | Freq: Every day | ORAL | Status: DC

## 2015-03-21 NOTE — Progress Notes (Signed)
   Subjective:    Patient ID: Lisa Cannon, female    DOB: 31-Mar-1981, 34 y.o.   MRN: [id number]  HPI 34 yo MW P1 (2 1/2 yo daughter) here to discuss contraceptive options. She is currently on the OCP currently but her neurologist has suggested that she take her OCPs continuously so as to avoid some menstrual-related migraines.    Review of Systems     Objective:   Physical Exam  [id number] Breathing and ambulating normally      Assessment & Plan:  Contraception with migraines- change to Cam[place].  I have discussed vasectomy/BTL

## 2015-06-06 ENCOUNTER — Encounter: Payer: Self-pay | Admitting: Neurology

## 2015-06-06 ENCOUNTER — Ambulatory Visit (INDEPENDENT_AMBULATORY_CARE_PROVIDER_SITE_OTHER): Payer: 59 | Admitting: Neurology

## 2015-06-06 VITALS — BP 132/86 | HR 88 | Resp 16 | Ht 66.0 in | Wt 202.0 lb

## 2015-06-06 DIAGNOSIS — M542 Cervicalgia: Secondary | ICD-10-CM | POA: Diagnosis not present

## 2015-06-06 DIAGNOSIS — G43009 Migraine without aura, not intractable, without status migrainosus: Secondary | ICD-10-CM

## 2015-06-06 MED ORDER — NADOLOL 20 MG PO TABS: ORAL_TABLET | ORAL | Status: DC

## 2015-06-06 MED ORDER — NORTRIPTYLINE HCL 10 MG PO CAPS: 10.0000 mg | ORAL_CAPSULE | Freq: Every day | ORAL | Status: DC

## 2015-06-06 NOTE — Progress Notes (Signed)
NEUROLOGY FOLLOW UP OFFICE NOTE  Lisa Cannon [id number]  HISTORY OF PRESENT ILLNESS: I had the pleasure of seeing Lisa Cannon in follow-up in the neurology clinic on 06/06/2015.  The patient was last seen 3 months ago for worsening migraines. Since her last visit, she has increased Nadolol to 50mg  daily with no significant change in migraine frequency. She continues to have them 2-3 times a week, she takes Imitrex and Aleve 2-3 times a week. She is now tricycling her OCP, still with no difference in headaches. She reports that when Nadolol significantly helped her in the past, she was not on OCPs, and is looking into stopping this and having her tubes tied. The Flexeril continues to help with neck pain. She usually gets 8-9 hours of sleep but always feels tired. She denies any dizziness on the nadolol. She denies any vision changes, no focal numbness/tingling/weakness.   HPI: This is a very pleasant 34 yo RH woman with a history of migraines since her pre-teen years. This increased in frequency and severity when she was in college, with several migraines every week. Headaches are usually over the left frontal and retroorbital region, at times radiating to the left side of her neck. This is associated with nausea, dizziness, tightening in her jaw area, and mild photo/phonophobia. No migraine auras. She was initially started on Topamax which caused more headaches and nausea. Butalbital does not help. She was switched to Corgard (Nadolol) 5 years ago, which significantly helped reduce headaches to once a month, usually a day before she would start her menstrual period. She stopped the medication when she became pregnant with her daughter. Since then, she would have 1 migraine/month on average, with good response to Imitrex.Migraine triggers include lack of sleep and allergies. Her father and possibly paternal grandmother had migraines.   PAST MEDICAL HISTORY: Past Medical History    Diagnosis Date  . Migraines   . Abnormal Pap smear of cervix     MEDICATIONS: Current Outpatient Prescriptions on File Prior to Visit  Medication Sig Dispense Refill  . cyclobenzaprine (FLEXERIL) 5 MG tablet Take 1 tablet at bedtime as needed for neck pain 30 tablet 5  . nadolol (CORGARD) 20 MG tablet Take 2-1/2 tablets daily 75 tablet 6  . SUMAtriptan (IMITREX) 100 MG tablet Take 100 mg by mouth at onset of headache. May repeat dose after 2 hours. Do not take more than 2/week. 10 tablet 11   No current facility-administered medications on file prior to visit.    ALLERGIES: No Known Allergies  FAMILY HISTORY: Family History  Problem Relation Age of Onset  . Diabetes Maternal Aunt   . Hypertension Maternal Aunt   . Hypertension Maternal Grandfather   . Cancer Paternal Grandfather     lung cancer  . Cancer Paternal Grandmother     brain    SOCIAL HISTORY: History   Social History  . Marital Status: Married    Spouse Name: N/A  . Number of Children: N/A  . Years of Education: N/A   Occupational History  . homemaker    Social History Main Topics  . Smoking status: Never Smoker   . Smokeless tobacco: Never Used  . Alcohol Use: No  . Drug Use: No  . Sexual Activity:    Partners: Male    Birth Control/ Protection: Pill     Comment: trying to conceive   Other Topics Concern  . Not on file   Social History Narrative    REVIEW OF  SYSTEMS: Constitutional: No fevers, chills, or sweats, no generalized fatigue, change in appetite Eyes: No visual changes, double vision, eye pain Ear, nose and throat: No hearing loss, ear pain, nasal congestion, sore throat Cardiovascular: No chest pain, palpitations Respiratory:  No shortness of breath at rest or with exertion, wheezes GastrointestinaI: No nausea, vomiting, diarrhea, abdominal pain, fecal incontinence Genitourinary:  No dysuria, urinary retention or frequency Musculoskeletal:  No neck pain, back  pain Integumentary: No rash, pruritus, skin lesions Neurological: as above Psychiatric: No depression, insomnia, anxiety Endocrine: No palpitations, fatigue, diaphoresis, mood swings, change in appetite, change in weight, increased thirst Hematologic/Lymphatic:  No anemia, purpura, petechiae. Allergic/Immunologic: no itchy/runny eyes, nasal congestion, recent allergic reactions, rashes  PHYSICAL EXAM: Filed Vitals:   06/06/15 1533  BP: 132/86  Pulse: 88  Resp: 16   General: No acute distress Head:  Normocephalic/atraumatic Neck: supple, no paraspinal tenderness, full range of motion Heart:  Regular rate and rhythm Lungs:  Clear to auscultation bilaterally Back: No paraspinal tenderness Skin/Extremities: No rash, no edema Neurological Exam: alert and oriented to person, place, and time. No aphasia or dysarthria. Fund of knowledge is appropriate.  Recent and remote memory are intact.  Attention and concentration are normal.    Able to name objects and repeat phrases. Cranial nerves: Pupils equal, round, reactive to light.  Fundoscopic exam unremarkable, no papilledema. Extraocular movements intact with no nystagmus. Visual fields full. Facial sensation intact. No facial asymmetry. Tongue, uvula, palate midline.  Motor: Bulk and tone normal, muscle strength 5/5 throughout with no pronator drift.  Sensation to light touch intact.  No extinction to double simultaneous stimulation.  Deep tendon reflexes 2+ throughout, toes downgoing.  Finger to nose testing intact.  Gait narrow-based and steady, able to tandem walk adequately.  Romberg negative.  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 No significant change on higher dose Nadolol and tricycling her OCP. She is interested in adding another preventative medication. She had side effects to Topamax in the past. She agrees  to start low dose nortriptyline 10mg  qhs, side effects were discussed. We may uptitrate this as tolerated in the future. She is interested in having her tubes tied and discontinuing OCP, this may hopefully help as well. She knows to minimize rescue medications to 2-3 a week to avoid rebound headaches, continue prn Imitrex. Continue headache diary and follow-up in 3 months.  Thank you for allowing me to participate in her care.  Please do not hesitate to call for any questions or concerns.  The duration of this appointment visit was 25 minutes of face-to-face time with the patient.  Greater than 50% of this time was spent in counseling, explanation of diagnosis, planning of further management, and coordination of care.   Ellouise Newer, M.D.   CC: Dr. Madilyn Fireman

## 2015-06-06 NOTE — Patient Instructions (Signed)
1. Start nortriptyline 10mg : Take 1 capsule at bedtime 2. Continue Corgard 50mg  daily 3. Discuss birth control with your OB 4. Keep a headache calendar 5. Follow-up in 3 months, call for any problems

## 2015-06-29 ENCOUNTER — Encounter: Payer: Self-pay | Admitting: Family Medicine

## 2015-06-29 ENCOUNTER — Ambulatory Visit (INDEPENDENT_AMBULATORY_CARE_PROVIDER_SITE_OTHER): Payer: 59 | Admitting: Family Medicine

## 2015-06-29 VITALS — BP 135/83 | HR 98 | Wt 200.0 lb

## 2015-06-29 DIAGNOSIS — H6092 Unspecified otitis externa, left ear: Secondary | ICD-10-CM | POA: Diagnosis not present

## 2015-06-29 MED ORDER — CEFDINIR 300 MG PO CAPS: 300.0000 mg | ORAL_CAPSULE | Freq: Two times a day (BID) | ORAL | Status: DC

## 2015-06-29 MED ORDER — NEOMYCIN-COLIST-HC-THONZONIUM 3.3-3-10-0.5 MG/ML OT SUSP: 4.0000 [drp] | Freq: Four times a day (QID) | OTIC | Status: DC

## 2015-06-29 NOTE — Patient Instructions (Signed)
Thank you for coming in today. Call or go to the emergency room if you get worse, have trouble breathing, have chest pains, or palpitations.  Use the drops.  If not getting better take the oral antibiotic.    Otitis Externa Otitis externa is a bacterial or fungal infection of the outer ear canal. This is the area from the eardrum to the outside of the ear. Otitis externa is sometimes called "swimmer's ear." CAUSES  Possible causes of infection include:  Swimming in dirty water.  Moisture remaining in the ear after swimming or bathing.  Mild injury (trauma) to the ear.  Objects stuck in the ear (foreign body).  Cuts or scrapes (abrasions) on the outside of the ear. SIGNS AND SYMPTOMS  The first symptom of infection is often itching in the ear canal. Later signs and symptoms may include swelling and redness of the ear canal, ear pain, and yellowish-white fluid (pus) coming from the ear. The ear pain may be worse when pulling on the earlobe. DIAGNOSIS  Your health care provider will perform a physical exam. A sample of fluid may be taken from the ear and examined for bacteria or fungi. TREATMENT  Antibiotic ear drops are often given for 10 to 14 days. Treatment may also include pain medicine or corticosteroids to reduce itching and swelling. HOME CARE INSTRUCTIONS   Apply antibiotic ear drops to the ear canal as prescribed by your health care provider.  Take medicines only as directed by your health care provider.  If you have diabetes, follow any additional treatment instructions from your health care provider.  Keep all follow-up visits as directed by your health care provider. PREVENTION   Keep your ear dry. Use the corner of a towel to absorb water out of the ear canal after swimming or bathing.  Avoid scratching or putting objects inside your ear. This can damage the ear canal or remove the protective wax that lines the canal. This makes it easier for bacteria and fungi to  grow.  Avoid swimming in lakes, polluted water, or poorly chlorinated pools.  You may use ear drops made of rubbing alcohol and vinegar after swimming. Combine equal parts of white vinegar and alcohol in a bottle. Put 3 or 4 drops into each ear after swimming. SEEK MEDICAL CARE IF:   You have a fever.  Your ear is still red, swollen, painful, or draining pus after 3 days.  Your redness, swelling, or pain gets worse.  You have a severe headache.  You have redness, swelling, pain, or tenderness in the area behind your ear. MAKE SURE YOU:   Understand these instructions.  Will watch your condition.  Will get help right away if you are not doing well or get worse. Document Released: 11/18/2005 Document Revised: 04/04/2014 Document Reviewed: 12/05/2011 [facility] Patient Information 2015 [place], [place]. This information is not intended to replace advice given to you by your health care provider. Make sure you discuss any questions you have with your health care provider.

## 2015-06-29 NOTE — Progress Notes (Signed)
Lisa Cannon is a 34 y.o. female who presents to [facility]  today for left ear full and painful. Symptoms present last several days. She feels as though there is water trapped in her ear. She has tried some over-the-counter eardrops and Aleve which helped only a little. She notes pain with her motion. She notes decreased hearing. No fevers chills nausea vomiting or diarrhea.   Past Medical History  Diagnosis Date  . Migraines   . Abnormal Pap smear of cervix    Past Surgical History  Procedure Laterality Date  . Cervical biopsy  w/ loop electrode excision  2002  . Wisdom tooth extraction     History  Substance Use Topics  . Smoking status: Never Smoker   . Smokeless tobacco: Never Used  . Alcohol Use: No   ROS as above Medications: Current Outpatient Prescriptions  Medication Sig Dispense Refill  . cefdinir (OMNICEF) 300 MG capsule Take 1 capsule (300 mg total) by mouth 2 (two) times daily. 14 capsule 0  . cyclobenzaprine (FLEXERIL) 5 MG tablet Take 1 tablet at bedtime as needed for neck pain 30 tablet 5  . nadolol (CORGARD) 20 MG tablet Take 2-1/2 tablets daily 75 tablet 6  . neomycin-colistin-hydrocortisone-thonzonium (CORTISPORIN-TC) 3.02-01-09-0.5 MG/ML otic suspension Place 4 drops into the left ear 4 (four) times daily. 10 mL 1  . nortriptyline (PAMELOR) 10 MG capsule Take 1 capsule (10 mg total) by mouth at bedtime. 30 capsule 3  . SUMAtriptan (IMITREX) 100 MG tablet Take 100 mg by mouth at onset of headache. May repeat dose after 2 hours. Do not take more than 2/week. 10 tablet 11   No current facility-administered medications for this visit.   No Known Allergies   Exam:  BP 135/83 mmHg  Pulse 98  Wt 200 lb (90.719 kg) Gen: Well NAD HEENT: EOMI,  MMM right ear canal and tympanic membranes normal. Left ear canal is swollen and erythematous. The tympanic membranes itself is not erythematous with small amount of fluid present.  Mastoids are nontender bilaterally. Lungs: Normal work of breathing. CTABL Heart: RRR no MRG Abd: NABS, Soft. Nondistended, Nontender Exts: Brisk capillary refill, warm and well perfused.   No results found for this or any previous visit (from the past 24 hour(s)). No results found.   Please see individual assessment and plan sections.

## 2015-06-29 NOTE — Assessment & Plan Note (Signed)
Left ear otitis externa with serous effusion. Treat with Cortisporin drops. Use Omnicef if not better.

## 2015-06-30 ENCOUNTER — Ambulatory Visit (INDEPENDENT_AMBULATORY_CARE_PROVIDER_SITE_OTHER): Payer: 59 | Admitting: Sports Medicine

## 2015-06-30 ENCOUNTER — Encounter: Payer: Self-pay | Admitting: Sports Medicine

## 2015-06-30 DIAGNOSIS — H6092 Unspecified otitis externa, left ear: Secondary | ICD-10-CM | POA: Diagnosis not present

## 2015-06-30 MED ORDER — KETOROLAC TROMETHAMINE 30 MG/ML IJ SOLN
30.0000 mg | Freq: Once | INTRAMUSCULAR | Status: AC
  Administered 2015-06-30: 30 mg via INTRAMUSCULAR

## 2015-06-30 MED ORDER — FLUTICASONE PROPIONATE 50 MCG/ACT NA SUSP: NASAL | Status: DC

## 2015-06-30 MED ORDER — CEFTRIAXONE SODIUM 1 G IJ SOLR
1.0000 g | Freq: Once | INTRAMUSCULAR | Status: AC
  Administered 2015-06-30: 1 g via INTRAMUSCULAR

## 2015-06-30 MED ORDER — CIPROFLOXACIN-DEXAMETHASONE 0.3-0.1 % OT SUSP: 4.0000 [drp] | Freq: Two times a day (BID) | OTIC | Status: AC

## 2015-06-30 NOTE — Progress Notes (Signed)
  Subjective:    CC: Ear infection  HPI:  This is a pleasant 34 year old female, she was seen by Dr. Georgina Snell yesterday and started on Cortisporin eardrops, and Omnicef. She did get some nausea with Omnicef but no rash. She tells me that she has not noted any improvement in her ear pain, understandably though since it has only been one day. She does desire to switch her medications, symptoms are moderate, persistent, she has also developed some pain in the right ear. There is also a bit of difficulty with hearing.  Past medical history, Surgical history, Family history not pertinant except as noted below, Social history, Allergies, and medications have been entered into the medical record, reviewed, and no changes needed.   Review of Systems: No fevers, chills, night sweats, weight loss, chest pain, or shortness of breath.   Objective:    General: Well Developed, well nourished, and in no acute distress.  Neuro: Alert and oriented x3, extra-ocular muscles intact, sensation grossly intact.  HEENT: Normocephalic, atraumatic, pupils equal round reactive to light, neck supple, no masses, no lymphadenopathy, thyroid nonpalpable. Oropharynx and nasopharynx unremarkable, left ear canal is swollen, minimally erythematous, right tympanic membrane appears dull and minimally erythematous. No sinus pain or pressure.  Skin: Warm and dry, no rashes. Cardiac: Regular rate and rhythm, no murmurs rubs or gallops, no lower extremity edema.  Respiratory: Clear to auscultation bilaterally. Not using accessory muscles, speaking in full sentences.  Impression and Recommendations:    I spent 25 minutes with this patient, greater than 50% was face-to-face time counseling regarding the above diagnoses

## 2015-06-30 NOTE — Addendum Note (Signed)
Addended by: Doree Albee on: 06/30/2015 09:53 AM   Modules accepted: Orders

## 2015-06-30 NOTE — Assessment & Plan Note (Signed)
Switching to Ciprodex drops, adding Flonase, patient will also get Rocephin 1 g and Toradol 30 intramuscular.

## 2015-07-12 ENCOUNTER — Encounter (INDEPENDENT_AMBULATORY_CARE_PROVIDER_SITE_OTHER): Payer: Self-pay

## 2015-07-12 ENCOUNTER — Ambulatory Visit (INDEPENDENT_AMBULATORY_CARE_PROVIDER_SITE_OTHER): Payer: 59 | Admitting: Obstetrics & Gynecology

## 2015-07-12 ENCOUNTER — Encounter: Payer: Self-pay | Admitting: Obstetrics & Gynecology

## 2015-07-12 VITALS — BP 112/77 | HR 86 | Resp 16 | Ht 66.0 in | Wt 200.0 lb

## 2015-07-12 DIAGNOSIS — Z3009 Encounter for other general counseling and advice on contraception: Secondary | ICD-10-CM | POA: Diagnosis not present

## 2015-07-12 NOTE — Progress Notes (Signed)
   Subjective:    Patient ID: Lisa Cannon, female    DOB: 1981-05-05, 34 y.o.   MRN: [id number]  HPI 34 yo MW P1 (45 yo daughter) here to discuss BTL. She is having migraines even with the Camrese. She says that her migraines were much better when she was off of exogenous hormones.   Review of Systems     Objective:   Physical Exam  [id number] Breathing, conversing, and ambulating normally      Assessment & Plan:  Desire for sterility She prefers Lap BS over vasectomy She is aware of the options of Mirena and Paragard I will send [place] an email to schedule it.

## 2015-07-13 ENCOUNTER — Telehealth: Payer: Self-pay | Admitting: Neurology

## 2015-07-13 NOTE — Telephone Encounter (Signed)
I spoke with her she states starting the Nortriptyline 10 mg at bedtime she has had constant vertigo(state's it kind of feels like that feeling you get when your on a ship or a boat). She also states she's had a little nausea, also states that vision gets blurry when she focuses on something to long. I did advise her to hold tonight's dose and she would be called back tomorrow with advisement.

## 2015-07-13 NOTE — Telephone Encounter (Signed)
Pt called in regards to her medication Nortriptyline, since taking she has had constant vertigo/Dawn  CB# [phone]

## 2015-07-14 ENCOUNTER — Ambulatory Visit: Payer: 59 | Admitting: Sports Medicine

## 2015-07-14 NOTE — Telephone Encounter (Signed)
Stop nortriptyline. I saw notes that she also has ear infection, which may cause vertigo as well. If symptoms continue after stopping nortriptyline, likely not due to medication, but hold for now until symptoms improve, then we can discuss other med options. Thanks

## 2015-07-14 NOTE — Telephone Encounter (Signed)
Patient notified of advisement. She will call back in a week with an update on how she is doing.

## 2015-08-09 ENCOUNTER — Encounter (HOSPITAL_COMMUNITY): Payer: Self-pay | Admitting: *Deleted

## 2015-08-30 ENCOUNTER — Ambulatory Visit (INDEPENDENT_AMBULATORY_CARE_PROVIDER_SITE_OTHER): Payer: 59 | Admitting: Family Medicine

## 2015-08-30 ENCOUNTER — Encounter: Payer: Self-pay | Admitting: Family Medicine

## 2015-08-30 VITALS — BP 130/68 | HR 89 | Temp 98.3°F | Ht 66.0 in | Wt 203.0 lb

## 2015-08-30 DIAGNOSIS — Z23 Encounter for immunization: Secondary | ICD-10-CM

## 2015-08-30 DIAGNOSIS — R197 Diarrhea, unspecified: Secondary | ICD-10-CM

## 2015-08-30 NOTE — Progress Notes (Signed)
   Subjective:    Patient ID: Lisa Cannon, female    DOB: [birth year], 34 y.o.   MRN: [id number]  HPI  has had watery stools on and off for one month.  Getting some abdominal cramping. Using the bathroom multiple times a day.  No blood in the stool.  No fever, vomiting.  Occ heartburn.  No new medication. She still stop birth control. No travel out of the country.  No camping, etc. her husband was recently diagnosed with C. difficile and it had a recurrence of diarrhea and in fact [facility] office yesterday to be retested after completing treatment. Her daughter has also been having some loose stools and got tested on Friday.   Review of Systems     Objective:   Physical Exam  Constitutional: She is oriented to person, place, and time. She appears well-developed and well-nourished.  HENT:  Head: Normocephalic and atraumatic.  Cardiovascular: Normal rate, regular rhythm and normal heart sounds.   Pulmonary/Chest: Effort normal and breath sounds normal.  Abdominal: Soft. Bowel sounds are normal. She exhibits no distension and no mass. There is no tenderness. There is no rebound and no guarding.  Neurological: She is alert and oriented to person, place, and time.  Skin: Skin is warm and dry.  Psychiatric: She has a normal mood and affect. Her behavior is normal.          Assessment & Plan:  Diarrhea - concerning for C. difficile since she's had a positive contact in the home. Will do a C. difficile by PCR as well as a stool culture call with results once available. No blood in the stool and no fevers or chills and no tender abdomen today so decided to hold off on any blood work.

## 2015-08-31 LAB — CLOSTRIDIUM DIFFICILE BY PCR

## 2015-09-03 LAB — STOOL CULTURE

## 2015-09-05 ENCOUNTER — Other Ambulatory Visit: Payer: Self-pay | Admitting: Neurology

## 2015-09-05 NOTE — Telephone Encounter (Signed)
Please review

## 2015-09-06 ENCOUNTER — Encounter (HOSPITAL_COMMUNITY)
Admission: RE | Admit: 2015-09-06 | Discharge: 2015-09-06 | Disposition: A | Discharge status: Home / Self Care | Payer: 59 | Source: Ambulatory Visit | Attending: Obstetrics & Gynecology | Admitting: Obstetrics & Gynecology

## 2015-09-06 ENCOUNTER — Encounter (HOSPITAL_COMMUNITY): Payer: Self-pay

## 2015-09-06 DIAGNOSIS — Z01818 Encounter for other preprocedural examination: Secondary | ICD-10-CM | POA: Insufficient documentation

## 2015-09-06 LAB — CBC
HCT: 41.8 % (ref 36.0–46.0)
Hemoglobin: 14.4 g/dL (ref 12.0–15.0)
MCH: 29.4 pg (ref 26.0–34.0)
MCHC: 34.4 g/dL (ref 30.0–36.0)
MCV: 85.3 fL (ref 78.0–100.0)
Platelets: 255 10*3/uL (ref 150–400)
RBC: 4.9 MIL/uL (ref 3.87–5.11)
RDW: 12.7 % (ref 11.5–15.5)
WBC: 8 10*3/uL (ref 4.0–10.5)

## 2015-09-06 NOTE — Patient Instructions (Addendum)
   Your procedure is scheduled on: OCT 11 (TUESDAY)  Enter through the Main Entrance of [facility] at: 1:30 PM  Pick up the phone at the desk and dial [phone] and inform us of your arrival.  Please call this number if you have any problems the morning of surgery: [phone]  Remember: Do not eat food after 5AM DAY OF SURGERY Do not drink clear liquids after: 1030AM DAY OF SURGERY   Do not wear jewelry, make-up, or FINGER nail polish No metal in your hair or on your body. Do not wear lotions, powders, perfumes.  You may wear deodorant.  Do not bring valuables to the hospital.      Patients discharged on the day of surgery will not be allowed to drive home.

## 2015-09-08 ENCOUNTER — Ambulatory Visit: Payer: 59 | Admitting: Neurology

## 2015-09-12 ENCOUNTER — Encounter (HOSPITAL_COMMUNITY)
Admission: RE | Disposition: A | Discharge status: Home / Self Care | Payer: Self-pay | Source: Ambulatory Visit | Attending: Obstetrics & Gynecology

## 2015-09-12 ENCOUNTER — Ambulatory Visit (HOSPITAL_COMMUNITY)
Admission: RE | Admit: 2015-09-12 | Discharge: 2015-09-12 | Disposition: A | Discharge status: Home / Self Care | Payer: 59 | Source: Ambulatory Visit | Attending: Obstetrics & Gynecology | Admitting: Obstetrics & Gynecology

## 2015-09-12 ENCOUNTER — Ambulatory Visit (HOSPITAL_COMMUNITY): Payer: 59 | Admitting: Anesthesiology

## 2015-09-12 ENCOUNTER — Encounter (HOSPITAL_COMMUNITY): Payer: Self-pay

## 2015-09-12 DIAGNOSIS — Z302 Encounter for sterilization: Secondary | ICD-10-CM | POA: Insufficient documentation

## 2015-09-12 DIAGNOSIS — E669 Obesity, unspecified: Secondary | ICD-10-CM | POA: Diagnosis not present

## 2015-09-12 DIAGNOSIS — M542 Cervicalgia: Secondary | ICD-10-CM | POA: Diagnosis not present

## 2015-09-12 DIAGNOSIS — G43909 Migraine, unspecified, not intractable, without status migrainosus: Secondary | ICD-10-CM | POA: Diagnosis not present

## 2015-09-12 DIAGNOSIS — Z79899 Other long term (current) drug therapy: Secondary | ICD-10-CM | POA: Diagnosis not present

## 2015-09-12 DIAGNOSIS — K660 Peritoneal adhesions (postprocedural) (postinfection): Secondary | ICD-10-CM | POA: Insufficient documentation

## 2015-09-12 HISTORY — PX: LAPAROSCOPIC LYSIS OF ADHESIONS: SHX5905

## 2015-09-12 HISTORY — PX: LAPAROSCOPIC BILATERAL SALPINGECTOMY: SHX5889

## 2015-09-12 LAB — PREGNANCY, URINE: Preg Test, Ur: NEGATIVE

## 2015-09-12 SURGERY — SALPINGECTOMY, BILATERAL, LAPAROSCOPIC
Anesthesia: General | Site: Abdomen | Laterality: Bilateral

## 2015-09-12 MED ORDER — KETOROLAC TROMETHAMINE 30 MG/ML IJ SOLN
INTRAMUSCULAR | Status: AC
  Filled 2015-09-12: qty 1

## 2015-09-12 MED ORDER — PROPOFOL 10 MG/ML IV BOLUS
INTRAVENOUS | Status: AC
  Filled 2015-09-12: qty 20

## 2015-09-12 MED ORDER — ROCURONIUM BROMIDE 100 MG/10ML IV SOLN
INTRAVENOUS | Status: DC | PRN
  Administered 2015-09-12: 30 mg via INTRAVENOUS

## 2015-09-12 MED ORDER — ONDANSETRON HCL 4 MG/2ML IJ SOLN: 4.0000 mg | Freq: Once | INTRAMUSCULAR | Status: DC | PRN

## 2015-09-12 MED ORDER — GLYCOPYRROLATE 0.2 MG/ML IJ SOLN
INTRAMUSCULAR | Status: DC | PRN
  Administered 2015-09-12: 0.4 mg via INTRAVENOUS

## 2015-09-12 MED ORDER — OXYCODONE-ACETAMINOPHEN 5-325 MG PO TABS: 1.0000 | ORAL_TABLET | Freq: Four times a day (QID) | ORAL | Status: DC | PRN

## 2015-09-12 MED ORDER — KETOROLAC TROMETHAMINE 30 MG/ML IJ SOLN
INTRAMUSCULAR | Status: DC | PRN
  Administered 2015-09-12: 30 mg via INTRAVENOUS

## 2015-09-12 MED ORDER — FENTANYL CITRATE (PF) 100 MCG/2ML IJ SOLN: 25.0000 ug | INTRAMUSCULAR | Status: DC | PRN

## 2015-09-12 MED ORDER — DEXAMETHASONE SODIUM PHOSPHATE 4 MG/ML IJ SOLN
INTRAMUSCULAR | Status: DC | PRN
  Administered 2015-09-12: 4 mg via INTRAVENOUS

## 2015-09-12 MED ORDER — CEFAZOLIN SODIUM-DEXTROSE 2-3 GM-% IV SOLR
INTRAVENOUS | Status: AC
  Filled 2015-09-12: qty 50

## 2015-09-12 MED ORDER — MIDAZOLAM HCL 5 MG/5ML IJ SOLN
INTRAMUSCULAR | Status: DC | PRN
  Administered 2015-09-12: 2 mg via INTRAVENOUS

## 2015-09-12 MED ORDER — SCOPOLAMINE 1 MG/3DAYS TD PT72
1.0000 | MEDICATED_PATCH | Freq: Once | TRANSDERMAL | Status: DC
  Administered 2015-09-12: 1.5 mg via TRANSDERMAL

## 2015-09-12 MED ORDER — LIDOCAINE HCL (CARDIAC) 20 MG/ML IV SOLN
INTRAVENOUS | Status: DC | PRN
  Administered 2015-09-12: 80 mg via INTRAVENOUS

## 2015-09-12 MED ORDER — LACTATED RINGERS IV SOLN
INTRAVENOUS | Status: DC
  Administered 2015-09-12: 125 mL/h via INTRAVENOUS
  Administered 2015-09-12: 16:00:00 via INTRAVENOUS

## 2015-09-12 MED ORDER — BUPIVACAINE HCL (PF) 0.5 % IJ SOLN
INTRAMUSCULAR | Status: DC | PRN
  Administered 2015-09-12: 25 mL

## 2015-09-12 MED ORDER — NEOSTIGMINE METHYLSULFATE 10 MG/10ML IV SOLN
INTRAVENOUS | Status: DC | PRN
  Administered 2015-09-12: 3 mg via INTRAVENOUS

## 2015-09-12 MED ORDER — PROPOFOL 10 MG/ML IV BOLUS
INTRAVENOUS | Status: DC | PRN
  Administered 2015-09-12: 200 mg via INTRAVENOUS

## 2015-09-12 MED ORDER — DEXAMETHASONE SODIUM PHOSPHATE 4 MG/ML IJ SOLN
INTRAMUSCULAR | Status: AC
  Filled 2015-09-12: qty 1

## 2015-09-12 MED ORDER — SCOPOLAMINE 1 MG/3DAYS TD PT72
MEDICATED_PATCH | TRANSDERMAL | Status: DC
Start: 2015-09-12 — End: 2015-09-12
  Administered 2015-09-12: 1.5 mg via TRANSDERMAL
  Filled 2015-09-12: qty 1

## 2015-09-12 MED ORDER — FENTANYL CITRATE (PF) 250 MCG/5ML IJ SOLN
INTRAMUSCULAR | Status: AC
  Filled 2015-09-12: qty 25

## 2015-09-12 MED ORDER — CEFAZOLIN SODIUM-DEXTROSE 2-3 GM-% IV SOLR
2.0000 g | INTRAVENOUS | Status: AC
  Administered 2015-09-12: 2 g via INTRAVENOUS

## 2015-09-12 MED ORDER — LIDOCAINE HCL (CARDIAC) 20 MG/ML IV SOLN
INTRAVENOUS | Status: AC
  Filled 2015-09-12: qty 5

## 2015-09-12 MED ORDER — ONDANSETRON HCL 4 MG/2ML IJ SOLN
INTRAMUSCULAR | Status: DC | PRN
  Administered 2015-09-12: 4 mg via INTRAVENOUS

## 2015-09-12 MED ORDER — MIDAZOLAM HCL 2 MG/2ML IJ SOLN
INTRAMUSCULAR | Status: AC
  Filled 2015-09-12: qty 4

## 2015-09-12 MED ORDER — BUPIVACAINE HCL (PF) 0.5 % IJ SOLN
INTRAMUSCULAR | Status: AC
  Filled 2015-09-12: qty 30

## 2015-09-12 MED ORDER — ONDANSETRON HCL 4 MG/2ML IJ SOLN
INTRAMUSCULAR | Status: AC
  Filled 2015-09-12: qty 2

## 2015-09-12 MED ORDER — FENTANYL CITRATE (PF) 100 MCG/2ML IJ SOLN
INTRAMUSCULAR | Status: DC | PRN
  Administered 2015-09-12: 50 ug via INTRAVENOUS
  Administered 2015-09-12: 150 ug via INTRAVENOUS
  Administered 2015-09-12: 50 ug via INTRAVENOUS

## 2015-09-12 SURGICAL SUPPLY — 24 items
APPLICATOR COTTON TIP 6IN STRL (MISCELLANEOUS) IMPLANT
CATH ROBINSON RED A/P 16FR (CATHETERS) ×4 IMPLANT
CLOTH BEACON ORANGE TIMEOUT ST (SAFETY) ×4 IMPLANT
DRSG COVADERM PLUS 2X2 (GAUZE/BANDAGES/DRESSINGS) ×4 IMPLANT
DRSG OPSITE POSTOP 3X4 (GAUZE/BANDAGES/DRESSINGS) ×4 IMPLANT
DURAPREP 26ML APPLICATOR (WOUND CARE) ×4 IMPLANT
GLOVE BIO SURGEON STRL SZ 6.5 (GLOVE) ×3 IMPLANT
GLOVE BIO SURGEONS STRL SZ 6.5 (GLOVE) ×1
GOWN STRL REUS W/TWL LRG LVL3 (GOWN DISPOSABLE) ×8 IMPLANT
NDL SAFETY ECLIPSE 18X1.5 (NEEDLE) ×2 IMPLANT
NEEDLE HYPO 18GX1.5 SHARP (NEEDLE) ×2
NEEDLE INSUFFLATION 120MM (ENDOMECHANICALS) ×4 IMPLANT
NS IRRIG 1000ML POUR BTL (IV SOLUTION) IMPLANT
PACK LAPAROSCOPY BASIN (CUSTOM PROCEDURE TRAY) ×4 IMPLANT
PAD POSITIONING PINK XL (MISCELLANEOUS) ×4 IMPLANT
SHEARS HARMONIC ACE PLUS 36CM (ENDOMECHANICALS) ×4 IMPLANT
SUT VICRYL 0 UR6 27IN ABS (SUTURE) ×4 IMPLANT
SUT VICRYL 4-0 PS2 18IN ABS (SUTURE) ×4 IMPLANT
SYR 30ML LL (SYRINGE) ×4 IMPLANT
TOWEL OR 17X24 6PK STRL BLUE (TOWEL DISPOSABLE) ×8 IMPLANT
TROCAR OPTI TIP 5M 100M (ENDOMECHANICALS) ×4 IMPLANT
TROCAR XCEL DIL TIP R 11M (ENDOMECHANICALS) ×4 IMPLANT
TROCAR XCEL OPT SLVE 5M 100M (ENDOMECHANICALS) ×4 IMPLANT
WARMER LAPAROSCOPE (MISCELLANEOUS) ×4 IMPLANT

## 2015-09-12 NOTE — Transfer of Care (Signed)
Immediate Anesthesia Transfer of Care Note  Patient: Lisa Cannon  Procedure(s) Performed: Procedure(s): LAPAROSCOPIC BILATERAL SALPINGECTOMY (Bilateral) LAPAROSCOPIC LYSIS OF ADHESIONS  Patient Location: PACU  Anesthesia Type:General  Level of Consciousness: awake, alert  and oriented  Airway & Oxygen Therapy: Patient Spontanous Breathing and Patient connected to nasal cannula oxygen  Post-op Assessment: Report given to RN and Post -op Vital signs reviewed and stable  Post vital signs: Reviewed and stable  Last Vitals:  Filed Vitals:   09/12/15 1338  BP: 124/73  Pulse: 77  Temp: 36.5 C  Resp: 20    Complications: No apparent anesthesia complications

## 2015-09-12 NOTE — Discharge Instructions (Signed)
Laparoscopic Tubal Ligation, Care After Refer to this sheet in the next few weeks. These instructions provide you with information about caring for yourself after your procedure. Your health care provider may also give you more specific instructions. Your treatment has been planned according to current medical practices, but problems sometimes occur. Call your health care provider if you have any problems or questions after your procedure. WHAT TO EXPECT AFTER THE PROCEDURE After your procedure, it is common to have:  Sore throat.  Soreness at the incision site.  Mild cramping.  Tiredness.  Mild nausea or vomiting.  Shoulder pain. HOME CARE INSTRUCTIONS  Rest for the remainder of the day.  Take medicines only as directed by your health care provider. These include over-the-counter medicines and prescription medicines. Do not take aspirin, which can cause bleeding.  Over the next few days, gradually return to your normal activities and your normal diet.  Avoid sexual intercourse for 2 weeks or as directed by your health care provider.  Do not use tampons, and do not douche.  Do not drive or operate heavy machinery while taking pain medicine.  Do not lift anything that is heavier than 5 lb (2.3 kg) for 2 weeks or as directed by your health care provider.  Do not take baths. Take showers only. Ask your health care provider when you can start taking baths.  Take your temperature twice each day and write it down.  Try to have help for your household needs for the first 7-10 days.  There are many different ways to close and cover an incision, including stitches (sutures), skin glue, and adhesive strips. Follow instructions from your health care provider about:  Incision care.  Bandage (dressing) changes and removal.  Incision closure removal.  Check your incision area every day for signs of infection. Watch for:  Redness, swelling, or pain.  Fluid, blood, or pus.  Keep  all follow-up visits as directed by your health care provider. SEEK MEDICAL CARE IF:  You have redness, swelling, or increasing pain in your incision area.  You have fluid, blood, or pus coming from your incision for longer than 1 day.  You notice a bad smell coming from your incision or your dressing.  The edges of your incision break open after the sutures have been removed.  Your pain does not decrease after 2-3 days.  You have a rash.  You repeatedly become dizzy or light-headed.  You have a reaction to your medicine.  Your pain medicine is not helping.  You are constipated. SEEK IMMEDIATE MEDICAL CARE IF:  You have a fever.  You faint.  You have increasing pain in your abdomen.  You have severe pain in one or both of your shoulders.  You have bleeding or drainage from your suture sites or your vagina after surgery.  You have shortness of breath or have difficulty breathing.  You have chest pain or leg pain.  You have ongoing nausea, vomiting, or diarrhea.  DO NOT HAVE UNPROTECTED SEX UNTIL AFTER YOUR NEXT MENSTRUAL PERIOD!   This information is not intended to replace advice given to you by your health care provider. Make sure you discuss any questions you have with your health care provider.   Document Released: 06/07/2005 Document Revised: 04/04/2015 Document Reviewed: 02/29/2012 Elsevier Interactive Patient Education [employer].

## 2015-09-12 NOTE — Op Note (Signed)
09/12/2015  3:11 PM  PATIENT:  Lisa Cannon  34 y.o. female  PRE-OPERATIVE DIAGNOSIS:  Undesired fertility and desire for decrease risk of ovarian cancer  POST-OPERATIVE DIAGNOSIS: same + bowel adhesions  PROCEDURE:  Procedure(s): LAPAROSCOPIC BILATERAL SALPINGECTOMY (Bilateral) LAPAROSCOPIC LYSIS OF ADHESIONS  SURGEON:  Surgeon(s) and Role:    * Emily Filbert, MD - Primary  ANESTHESIA:   general  EBL:  Total I/O In: -  Out: 50 [Urine:45; Blood:5]  BLOOD ADMINISTERED:none  DRAINS: none   LOCAL MEDICATIONS USED:  MARCAINE     SPECIMEN:  Source of Specimen:  both oviducts  DISPOSITION OF SPECIMEN:  PATHOLOGY  COUNTS:  YES  TOURNIQUET:  * No tourniquets in log *  DICTATION: .Dragon Dictation  PLAN OF CARE: Discharge to home after PACU  PATIENT DISPOSITION:  PACU - hemodynamically stable.   Delay start of Pharmacological VTE agent (>24hrs) due to surgical blood loss or risk of bleeding: not applicable  The risks, benefits, and alternatives of surgery were explained, understood, accepted. She is certain that she wants permanent sterility. She understands that this is not reversible. She understands there is a small failure rate of this procedure. She also would like to have both oviducts removed her due newest recommendations to help prevent ovarian/peritoneal cancer. In the operating room she was placed in the dorsal lithotomy position, and general anesthesia was given without complication. Her abdomen and vagina were prepped and draped in the usual sterile fashion. A timeout procedure was done. A bimanual exam revealed a small anteverted and mobile uterus. Her adnexa felt normal. A Hulka manipulator was placed. Her bladder was emptied with a Robinson catheter. Gloves were changed, and attention was turned to the abdomen. Approximately the 5 mL of 0.5% Marcaine was injected into the umbilicus. A vertical incision was made at the site. A varies needle was placed  intraperitoneally. Low-flow CO2 was used to insufflate the abdomen to approximately 3-1/2 L. Once a good pneumoperitoneum was established, a 10 mm Excel trocar was placed. Laparoscopy confirmed correct placement. A 5 mm port was placed in each lower quadrant under direct laparoscopic visualization after injecting 0.5% Marcaine in the incision sites. Her pelvis and upper abdomen appeared normal with the exception of a large adhesion of her large bowel to her right pelvic side wall. A Harmonic scapel was used to hemostatically removed both oviducts. I then used the Harmonic scapel to remove the adhesions. Hemostasis was noted. I switched to a 5 mm camera and removed the oviducts through the umbilical port.  I removed the 5 mm ports and noted hemostasis. The umbilical fascia was closed with a 0 vicryl suture. No defects were palpable. A subcuticular closure was done with 4-0 Vicryl suture at all incision sites. A Steri-Strip was placed across each incision. She was extubated and taken to the recovery room in stable condition.

## 2015-09-12 NOTE — Anesthesia Preprocedure Evaluation (Addendum)
Anesthesia Evaluation  Patient identified by MRN, date of birth, ID band Patient awake    Reviewed: Allergy & Precautions, NPO status , Patient's Chart, lab work & pertinent test results  History of Anesthesia Complications Negative for: history of anesthetic complications  Airway Mallampati: III  TM Distance: >3 FB Neck ROM: Full    Dental no notable dental hx. (+) Dental Advisory Given   Pulmonary neg pulmonary ROS,    Pulmonary exam normal breath sounds clear to auscultation       Cardiovascular negative cardio ROS Normal cardiovascular exam Rhythm:Regular Rate:Normal     Neuro/Psych  Headaches, negative psych ROS   GI/Hepatic negative GI ROS, Neg liver ROS,   Endo/Other  obesity  Renal/GU negative Renal ROS  negative genitourinary   Musculoskeletal negative musculoskeletal ROS (+)   Abdominal   Peds negative pediatric ROS (+)  Hematology negative hematology ROS (+)   Anesthesia Other Findings   Reproductive/Obstetrics negative OB ROS                           Anesthesia Physical Anesthesia Plan  ASA: II  Anesthesia Plan: General   Post-op Pain Management:    Induction: Intravenous  Airway Management Planned: Oral ETT  Additional Equipment:   Intra-op Plan:   Post-operative Plan: Extubation in OR  Informed Consent: I have reviewed the patients History and Physical, chart, labs and discussed the procedure including the risks, benefits and alternatives for the proposed anesthesia with the patient or authorized representative who has indicated his/her understanding and acceptance.   Dental advisory given  Plan Discussed with: CRNA  Anesthesia Plan Comments:        Anesthesia Quick Evaluation

## 2015-09-12 NOTE — Anesthesia Postprocedure Evaluation (Signed)
  Anesthesia Post-op Note  Patient: Lisa Cannon  Procedure(s) Performed: Procedure(s) (LRB): LAPAROSCOPIC BILATERAL SALPINGECTOMY (Bilateral) LAPAROSCOPIC LYSIS OF ADHESIONS  Patient Location: PACU  Anesthesia Type: General  Level of Consciousness: awake and alert   Airway and Oxygen Therapy: Patient Spontanous Breathing  Post-op Pain: mild  Post-op Assessment: Post-op Vital signs reviewed, Patient's Cardiovascular Status Stable, Respiratory Function Stable, Patent Airway and No signs of Nausea or vomiting  Last Vitals:  Filed Vitals:   09/12/15 1338  BP: 124/73  Pulse: 77  Temp: 36.5 C  Resp: 20    Post-op Vital Signs: stable   Complications: No apparent anesthesia complications

## 2015-09-12 NOTE — Anesthesia Procedure Notes (Signed)
Procedure Name: Intubation Date/Time: 09/12/2015 2:32 PM Performed by: Talbot Grumbling Pre-anesthesia Checklist: Emergency Drugs available, Suction available, Patient being monitored and Patient identified Patient Re-evaluated:Patient Re-evaluated prior to inductionOxygen Delivery Method: Circle system utilized Preoxygenation: Pre-oxygenation with 100% oxygen Intubation Type: IV induction Ventilation: Oral airway inserted - appropriate to patient size and Mask ventilation without difficulty Laryngoscope Size: Miller and 2 Grade View: Grade I Tube type: Oral Tube size: 7.0 mm Number of attempts: 1 Airway Equipment and Method: Stylet Placement Confirmation: ETT inserted through vocal cords under direct vision,  positive ETCO2 and breath sounds checked- equal and bilateral Secured at: 21 cm Tube secured with: Tape Dental Injury: Teeth and Oropharynx as per pre-operative assessment

## 2015-09-12 NOTE — H&P (Signed)
34 yo MW P1 (3 yo daughter) here to have a BTL. She is having migraines even with the Camrese. She says that her migraines were much better when she was off of exogenous hormones.   No LMP recorded.    Past Medical History  Diagnosis Date  . Migraines   . Abnormal Pap smear of cervix     Past Surgical History  Procedure Laterality Date  . Cervical biopsy  w/ loop electrode excision  2002  . Wisdom tooth extraction      Family History  Problem Relation Age of Onset  . Diabetes Maternal Aunt   . Hypertension Maternal Aunt   . Hypertension Maternal Grandfather   . Cancer Paternal Grandfather     lung cancer  . Cancer Paternal Grandmother     brain    Social History:  reports that she has never smoked. She has never used smokeless tobacco. She reports that she does not drink alcohol or use illicit drugs.  Allergies: No Known Allergies  Prescriptions prior to admission  Medication Sig Dispense Refill Last Dose  . cyclobenzaprine (FLEXERIL) 5 MG tablet TAKE 1 TABLET BY MOUTH AT BEDTIME AS NEEDED FOR NECK PAIN 30 tablet 5 09/11/2015 at 2000  . fluticasone (FLONASE) 50 MCG/ACT nasal spray One spray in each nostril twice a day, use left hand for right nostril, and right hand for left nostril. 48 g 3 Past Week at Unknown time  . nadolol (CORGARD) 20 MG tablet Take 2-1/2 tablets daily 75 tablet 6 09/11/2015 at 2000  . naproxen sodium (ALEVE) 220 MG tablet Take 220 mg by mouth daily as needed (for pain).   Past Week at Unknown time  . SUMAtriptan (IMITREX) 100 MG tablet Take 100 mg by mouth at onset of headache. May repeat dose after 2 hours. Do not take more than 2/week. 10 tablet 11 09/11/2015 at 1000  . nortriptyline (PAMELOR) 10 MG capsule Take 1 capsule (10 mg total) by mouth at bedtime. (Patient not taking: Reported on 09/01/2015) 30 capsule 3 Not Taking at Unknown time    ROS  Blood pressure 124/73, pulse 77, temperature 97.7 F (36.5 C), temperature source Oral, resp. rate  20, SpO2 100 %. Physical Exam Heart- rrr Lungs- CTAB Abd- benign Results for orders placed or performed during the hospital encounter of 09/12/15 (from the past 24 hour(s))  Pregnancy, urine     Status: None   Collection Time: 09/12/15  1:36 PM  Result Value Ref Range   Preg Test, Ur NEGATIVE NEGATIVE    No results found.  Assessment/Plan: Desire for permanent sterility and to decrease her risk of ovarian cancer- plan for laparoscopic bilateral salpingectomy.  She understands the risks of surgery, including, but not to infection, bleeding, DVTs, damage to bowel, bladder, ureters. She wishes to proceed.     Santos Sollenberger C. 09/12/2015, 2:17 PM

## 2015-09-13 ENCOUNTER — Encounter (HOSPITAL_COMMUNITY): Payer: Self-pay | Admitting: Obstetrics & Gynecology

## 2015-10-18 ENCOUNTER — Encounter: Payer: Self-pay | Admitting: Neurology

## 2015-10-18 ENCOUNTER — Ambulatory Visit (INDEPENDENT_AMBULATORY_CARE_PROVIDER_SITE_OTHER): Payer: 59 | Admitting: Neurology

## 2015-10-18 VITALS — BP 120/80 | HR 88 | Resp 16 | Ht 66.0 in | Wt 206.0 lb

## 2015-10-18 DIAGNOSIS — M542 Cervicalgia: Secondary | ICD-10-CM | POA: Diagnosis not present

## 2015-10-18 DIAGNOSIS — G43009 Migraine without aura, not intractable, without status migrainosus: Secondary | ICD-10-CM | POA: Diagnosis not present

## 2015-10-18 MED ORDER — SUMATRIPTAN SUCCINATE 100 MG PO TABS: ORAL_TABLET | ORAL | Status: DC

## 2015-10-18 MED ORDER — NADOLOL 20 MG PO TABS: ORAL_TABLET | ORAL | Status: DC

## 2015-10-18 MED ORDER — CYCLOBENZAPRINE HCL 5 MG PO TABS: ORAL_TABLET | ORAL | Status: DC

## 2015-10-18 NOTE — Progress Notes (Signed)
NEUROLOGY FOLLOW UP OFFICE NOTE  Lisa Cannon [id number]  HISTORY OF PRESENT ILLNESS: I had the pleasure of seeing Lisa Cannon in follow-up in the neurology clinic on 10/18/2015.  The patient was last seen 4 months ago for worsening migraines. On her last visit, she continued to report migraines 2-3 times a week on Nadolol 50mg  daily. We agreed to add on low dose nortriptyline 10mg  qhs, which made her feel bad with vertigo. This resolved after stopping medication. She reports that since she got off birth control 2 months and and underwent tubal ligation, headaches have been better. She goes longer stretches without headaches, today is day 6 headache-free. She usually has migraines once a week that are less severe, clustering for 3 days. Imitrex helps. She has occasional tingling in her right hand. She denies any vision changes, no falls. She continues to have neck pain, with good response to Flexeril. She reports sleep is good.   HPI: This is a very pleasant 34 yo RH woman with a history of migraines since her pre-teen years. This increased in frequency and severity when she was in college, with several migraines every week. Headaches are usually over the left frontal and retroorbital region, at times radiating to the left side of her neck. This is associated with nausea, dizziness, tightening in her jaw area, and mild photo/phonophobia. No migraine auras. She was initially started on Topamax which caused more headaches and nausea. Butalbital does not help. She was switched to Corgard (Nadolol) 5 years ago, which significantly helped reduce headaches to once a month, usually a day before she would start her menstrual period. She stopped the medication when she became pregnant with her daughter. Since then, she would have 1 migraine/month on average, with good response to Imitrex.Migraine triggers include lack of sleep and allergies. Her father and possibly paternal grandmother had  migraines.   PAST MEDICAL HISTORY: Past Medical History  Diagnosis Date  . Migraines   . Abnormal Pap smear of cervix     MEDICATIONS: Current Outpatient Prescriptions on File Prior to Visit  Medication Sig Dispense Refill  . cyclobenzaprine (FLEXERIL) 5 MG tablet TAKE 1 TABLET BY MOUTH AT BEDTIME AS NEEDED FOR NECK PAIN 30 tablet 5  . fluticasone (FLONASE) 50 MCG/ACT nasal spray One spray in each nostril twice a day, use left hand for right nostril, and right hand for left nostril. 48 g 3  . nadolol (CORGARD) 20 MG tablet Take 2-1/2 tablets daily 75 tablet 6  . naproxen sodium (ALEVE) 220 MG tablet Take 220 mg by mouth daily as needed (for pain).    . SUMAtriptan (IMITREX) 100 MG tablet Take 100 mg by mouth at onset of headache. May repeat dose after 2 hours. Do not take more than 2/week. 10 tablet 11   No current facility-administered medications on file prior to visit.    ALLERGIES: No Known Allergies  FAMILY HISTORY: Family History  Problem Relation Age of Onset  . Diabetes Maternal Aunt   . Hypertension Maternal Aunt   . Hypertension Maternal Grandfather   . Cancer Paternal Grandfather     lung cancer  . Cancer Paternal Grandmother     brain    SOCIAL HISTORY: Social History   Social History  . Marital Status: Married    Spouse Name: N/A  . Number of Children: N/A  . Years of Education: N/A   Occupational History  . homemaker    Social History Main Topics  . Smoking status: Never  Smoker   . Smokeless tobacco: Never Used  . Alcohol Use: No  . Drug Use: No  . Sexual Activity:    Partners: Male    Birth Control/ Protection: Pill     Comment: trying to conceive   Other Topics Concern  . Not on file   Social History Narrative    REVIEW OF SYSTEMS: Constitutional: No fevers, chills, or sweats, no generalized fatigue, change in appetite Eyes: No visual changes, double vision, eye pain Ear, nose and throat: No hearing loss, ear pain, nasal congestion,  sore throat Cardiovascular: No chest pain, palpitations Respiratory:  No shortness of breath at rest or with exertion, wheezes GastrointestinaI: No nausea, vomiting, diarrhea, abdominal pain, fecal incontinence Genitourinary:  No dysuria, urinary retention or frequency Musculoskeletal:  + neck pain, no back pain Integumentary: No rash, pruritus, skin lesions Neurological: as above Psychiatric: No depression, insomnia, anxiety Endocrine: No palpitations, fatigue, diaphoresis, mood swings, change in appetite, change in weight, increased thirst Hematologic/Lymphatic:  No anemia, purpura, petechiae. Allergic/Immunologic: no itchy/runny eyes, nasal congestion, recent allergic reactions, rashes  PHYSICAL EXAM: Filed Vitals:   10/18/15 0817  BP: 120/80  Pulse: 88  Resp: 16   General: No acute distress Head:  Normocephalic/atraumatic Neck: supple, no paraspinal tenderness, full range of motion Heart:  Regular rate and rhythm Lungs:  Clear to auscultation bilaterally Back: No paraspinal tenderness Skin/Extremities: No rash, no edema Neurological Exam: alert and oriented to person, place, and time. No aphasia or dysarthria. Fund of knowledge is appropriate.  Recent and remote memory are intact.  Attention and concentration are normal.    Able to name objects and repeat phrases. Cranial nerves: Pupils equal, round, reactive to light.  Fundoscopic exam unremarkable, no papilledema. Extraocular movements intact with no nystagmus. Visual fields full. Facial sensation intact. No facial asymmetry. Tongue, uvula, palate midline.  Motor: Bulk and tone normal, muscle strength 5/5 throughout with no pronator drift.  Sensation to light touch intact.  No extinction to double simultaneous stimulation.  Deep tendon reflexes 2+ throughout, toes downgoing.  Finger to nose testing intact.  Gait narrow-based and steady, able to tandem walk adequately.  Romberg negative.  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 Headaches have improved with discontinuation of OCPs, however she continues to report 1 migraine a week. She did not tolerate nortriptyline and continues on Nadolol 50mg  daily. We discussed option of either increasing Nadolol versus adding on a different headache preventative medication. She would like to hold off on any medication changes for now and will keep a calendar of her headaches. She will continue prn Imitrex and Flexeril. Follow-up in 2 months, she knows to call for any changes.   Thank you for allowing me to participate in her care.  Please do not hesitate to call for any questions or concerns.  The duration of this appointment visit was 25 minutes of face-to-face time with the patient.  Greater than 50% of this time was spent in counseling, explanation of diagnosis, planning of further management, and coordination of care.   Ellouise Newer, M.D.   CC: Dr. Madilyn Fireman

## 2015-10-18 NOTE — Patient Instructions (Addendum)
1. Continue Nadolol 20mg  2-1/2 tablets daily 2. Take Imitrex as needed at onset of headaches 3. Keep a calendar of your headaches 4. Follow-up in 2 months, call for any changes

## 2015-10-25 ENCOUNTER — Ambulatory Visit (INDEPENDENT_AMBULATORY_CARE_PROVIDER_SITE_OTHER): Payer: 59 | Admitting: Obstetrics & Gynecology

## 2015-10-25 ENCOUNTER — Encounter: Payer: Self-pay | Admitting: Obstetrics & Gynecology

## 2015-10-25 VITALS — BP 114/75 | HR 84 | Resp 16 | Ht 66.0 in | Wt 205.0 lb

## 2015-10-25 DIAGNOSIS — Z9889 Other specified postprocedural states: Secondary | ICD-10-CM

## 2015-10-25 NOTE — Progress Notes (Signed)
   Subjective:    Patient ID: Lisa Cannon, female    DOB: 19-Dec-1980, 34 y.o.   MRN: [id number]  HPI 34 yo lady here for a post op visit 6 weeks after a l/s BS. She has no complaints. She waited until 2 weeks post op to have sex. She is having her period now.   Review of Systems     Objective:   Physical Exam [id number] Abd- benign Incisions- healed well       Assessment & Plan:  Post op stable RTC 1 year/prn sooner

## 2015-12-06 MED FILL — SUMATRIPTAN SUCC 100 MG TAB: 100 | 30 days supply | Qty: 9 | Fill #9

## 2015-12-06 MED FILL — CYCLOBENZAPRINE 5 MG TABLET: 5 | 30 days supply | Qty: 30 | Fill #3

## 2015-12-06 MED FILL — NADOLOL 20 MG TABLET: 20 | 30 days supply | Qty: 75 | Fill #1

## 2016-01-05 MED FILL — SUMATRIPTAN SUCC 100 MG TAB: 100 | 30 days supply | Qty: 9 | Fill #10

## 2016-01-05 MED FILL — CYCLOBENZAPRINE 5 MG TABLET: 5 | 30 days supply | Qty: 30 | Fill #4

## 2016-01-17 ENCOUNTER — Encounter: Payer: Self-pay | Admitting: Neurology

## 2016-01-17 ENCOUNTER — Ambulatory Visit (INDEPENDENT_AMBULATORY_CARE_PROVIDER_SITE_OTHER): Payer: 59 | Admitting: Neurology

## 2016-01-17 VITALS — BP 116/78 | HR 94 | Ht 66.0 in | Wt 214.0 lb

## 2016-01-17 DIAGNOSIS — G43009 Migraine without aura, not intractable, without status migrainosus: Secondary | ICD-10-CM | POA: Diagnosis not present

## 2016-01-17 DIAGNOSIS — M542 Cervicalgia: Secondary | ICD-10-CM

## 2016-01-17 MED ORDER — NADOLOL 20 MG PO TABS: ORAL_TABLET | ORAL | Status: DC

## 2016-01-17 MED ORDER — CYCLOBENZAPRINE HCL 5 MG PO TABS: ORAL_TABLET | ORAL | Status: DC

## 2016-01-17 MED FILL — NADOLOL 20 MG TABLET: 20 | 30 days supply | Qty: 90 | Fill #0

## 2016-01-17 NOTE — Progress Notes (Signed)
NEUROLOGY FOLLOW UP OFFICE NOTE  Lisa Cannon [id number]  HISTORY OF PRESENT ILLNESS: I had the pleasure of seeing Lisa Cannon in follow-up in the neurology clinic on 01/17/2016.  The patient was last seen 3 months ago for worsening migraines. On her last visit, she reported an improvement with migraines off birth control, but still with migraines once a week. She continues to take Nadolol 50mg  daily. She did not tolerate nortriptyline. She reports that she is definitely better than before although with migraines on average still at once a week, but not as severe, usually triggered by weather or sleep. She has noticed they are worse the week before her menstrual cycle. She has a bad month every 3rd month or so. She continues to have neck pain and is thinking of doing neck PT in the Fall. She reports difficulty losing weight. She has occasional numbness in one arm at night. She denies any vision changes, no falls. Sleep is good.   HPI: This is a very pleasant 35 yo RH woman with a history of migraines since her pre-teen years. This increased in frequency and severity when she was in college, with several migraines every week. Headaches are usually over the left frontal and retroorbital region, at times radiating to the left side of her neck. This is associated with nausea, dizziness, tightening in her jaw area, and mild photo/phonophobia. No migraine auras. She was initially started on Topamax which caused more headaches and nausea. Butalbital does not help. She was switched to Corgard (Nadolol) 5 years ago, which significantly helped reduce headaches to once a month, usually a day before she would start her menstrual period. She stopped the medication when she became pregnant with her daughter. Since then, she would have 1 migraine/month on average, with good response to Imitrex.Migraine triggers include lack of sleep and allergies. Her father and possibly paternal grandmother had  migraines.   PAST MEDICAL HISTORY: Past Medical History  Diagnosis Date  . Migraines   . Abnormal Pap smear of cervix     MEDICATIONS: Current Outpatient Prescriptions on File Prior to Visit  Medication Sig Dispense Refill  . cyclobenzaprine (FLEXERIL) 5 MG tablet TAKE 1 TABLET BY MOUTH AT BEDTIME AS NEEDED FOR NECK PAIN 30 tablet 5  . fluticasone (FLONASE) 50 MCG/ACT nasal spray One spray in each nostril twice a day, use left hand for right nostril, and right hand for left nostril. 48 g 3  . nadolol (CORGARD) 20 MG tablet Take 2-1/2 tablets daily 75 tablet 6  . naproxen sodium (ALEVE) 220 MG tablet Take 220 mg by mouth daily as needed (for pain).    . SUMAtriptan (IMITREX) 100 MG tablet Take 100 mg by mouth at onset of headache. May repeat dose after 2 hours. Do not take more than 2/week. 10 tablet 11   No current facility-administered medications on file prior to visit.    ALLERGIES: No Known Allergies  FAMILY HISTORY: Family History  Problem Relation Age of Onset  . Diabetes Maternal Aunt   . Hypertension Maternal Aunt   . Hypertension Maternal Grandfather   . Cancer Paternal Grandfather     lung cancer  . Cancer Paternal Grandmother     brain    SOCIAL HISTORY: Social History   Social History  . Marital Status: Married    Spouse Name: N/A  . Number of Children: N/A  . Years of Education: N/A   Occupational History  . homemaker    Social History Main Topics  .  Smoking status: Never Smoker   . Smokeless tobacco: Never Used  . Alcohol Use: No  . Drug Use: No  . Sexual Activity:    Partners: Male    Birth Control/ Protection: Pill     Comment: trying to conceive   Other Topics Concern  . Not on file   Social History Narrative    REVIEW OF SYSTEMS: Constitutional: No fevers, chills, or sweats, no generalized fatigue, change in appetite Eyes: No visual changes, double vision, eye pain Ear, nose and throat: No hearing loss, ear pain, nasal congestion,  sore throat Cardiovascular: No chest pain, palpitations Respiratory:  No shortness of breath at rest or with exertion, wheezes GastrointestinaI: No nausea, vomiting, diarrhea, abdominal pain, fecal incontinence Genitourinary:  No dysuria, urinary retention or frequency Musculoskeletal:  + neck pain, no back pain Integumentary: No rash, pruritus, skin lesions Neurological: as above Psychiatric: No depression, insomnia, anxiety Endocrine: No palpitations, fatigue, diaphoresis, mood swings, change in appetite, change in weight, increased thirst Hematologic/Lymphatic:  No anemia, purpura, petechiae. Allergic/Immunologic: no itchy/runny eyes, nasal congestion, recent allergic reactions, rashes  PHYSICAL EXAM: Filed Vitals:   01/17/16 1113  BP: 116/78  Pulse: 94   General: No acute distress Head:  Normocephalic/atraumatic Neck: supple, no paraspinal tenderness, full range of motion Heart:  Regular rate and rhythm Lungs:  Clear to auscultation bilaterally Back: No paraspinal tenderness Skin/Extremities: No rash, no edema Neurological Exam: alert and oriented to person, place, and time. No aphasia or dysarthria. Fund of knowledge is appropriate.  Recent and remote memory are intact.  Attention and concentration are normal.    Able to name objects and repeat phrases. Cranial nerves: Pupils equal, round, reactive to light.  Fundoscopic exam unremarkable, no papilledema. Extraocular movements intact with no nystagmus. Visual fields full. Facial sensation intact. No facial asymmetry. Tongue, uvula, palate midline.  Motor: Bulk and tone normal, muscle strength 5/5 throughout with no pronator drift.  Sensation to light touch intact.  No extinction to double simultaneous stimulation.  Deep tendon reflexes 2+ throughout, toes downgoing.  Finger to nose testing intact.  Gait narrow-based and steady, able to tandem walk adequately.  Romberg negative.  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 Headaches have improved with discontinuation of OCPs, however she continues to report 1 migraine a week, although less severe. She will increase Nadolol to 60mg  daily. She will start taking Aleve BID for perimenstrual prophylaxis 3 days before and after her menstrual period. Continue prn Flexeril for neck pain, she hopes to have time for neck PT in the fall. She will continue prn Imitrex and knows to minimize rescue medications to 2-3 a week to avoid rebound headaches. She will continue headache calendar and follow-up in 7 months.   Thank you for allowing me to participate in her care.  Please do not hesitate to call for any questions or concerns.  The duration of this appointment visit was 25 minutes of face-to-face time with the patient.  Greater than 50% of this time was spent in counseling, explanation of diagnosis, planning of further management, and coordination of care.   Ellouise Newer, M.D.   CC: Dr. Madilyn Fireman

## 2016-01-17 NOTE — Patient Instructions (Addendum)
1. Increase Nadolol 20mg : Take 3 tablets daily.  2. Take Aleve twice a day as prophylaxis 3 days before and after your period 3. Continue Flexeril as needed for neck pain, we will discuss PT on your next visit 4. Continue headache calendar 5. Follow-up in 7 months, call for any changes.

## 2016-01-31 MED FILL — SUMATRIPTAN SUCC 100 MG TAB: 100 | 30 days supply | Qty: 9 | Fill #11

## 2016-01-31 MED FILL — CYCLOBENZAPRINE 5 MG TABLET: 5 | 30 days supply | Qty: 30 | Fill #5

## 2016-02-16 MED FILL — NADOLOL 20 MG TABLET: 20 | 30 days supply | Qty: 90 | Fill #1

## 2016-03-04 MED FILL — CYCLOBENZAPRINE 5 MG TABLET: 5 | 30 days supply | Qty: 30 | Fill #0

## 2016-03-04 MED FILL — SUMATRIPTAN SUCC 100 MG TAB: 100 | 30 days supply | Qty: 9 | Fill #12

## 2016-03-18 MED FILL — NADOLOL 20 MG TABLET: 20 | 30 days supply | Qty: 90 | Fill #2

## 2016-04-02 MED FILL — CYCLOBENZAPRINE 5 MG TABLET: 5 | 30 days supply | Qty: 30 | Fill #1

## 2016-04-02 MED FILL — SUMATRIPTAN SUCC 100 MG TAB: 100 | 30 days supply | Qty: 9 | Fill #0

## 2016-04-18 MED FILL — NADOLOL 20 MG TABLET: 20 | 30 days supply | Qty: 90 | Fill #3

## 2016-05-01 MED FILL — SUMATRIPTAN SUCC 100 MG TAB: 100 | 30 days supply | Qty: 9 | Fill #1

## 2016-05-01 MED FILL — CYCLOBENZAPRINE 5 MG TABLET: 5 | 30 days supply | Qty: 30 | Fill #2

## 2016-05-02 ENCOUNTER — Ambulatory Visit (INDEPENDENT_AMBULATORY_CARE_PROVIDER_SITE_OTHER): Payer: 59 | Admitting: Family Medicine

## 2016-05-02 ENCOUNTER — Encounter: Payer: Self-pay | Admitting: Family Medicine

## 2016-05-02 VITALS — BP 123/68 | HR 86 | Wt 214.0 lb

## 2016-05-02 DIAGNOSIS — Z13220 Encounter for screening for lipoid disorders: Secondary | ICD-10-CM

## 2016-05-02 DIAGNOSIS — R635 Abnormal weight gain: Secondary | ICD-10-CM

## 2016-05-02 DIAGNOSIS — Z6835 Body mass index (BMI) 35.0-35.9, adult: Secondary | ICD-10-CM

## 2016-05-02 MED ORDER — LIRAGLUTIDE -WEIGHT MANAGEMENT 18 MG/3ML ~~LOC~~ SOPN: 0.6000 mg | PEN_INJECTOR | Freq: Every day | SUBCUTANEOUS | Status: DC

## 2016-05-02 NOTE — Progress Notes (Signed)
Subjective:    CC: wants to discuss meds for weight loss  HPI:       Weight Loss pt reports that she has made changes to her diet x51mos eating btw 1400-1600 calories. she states that this worked well for her for the few weeks she had some weight loss but plateaued and gained it back. she has not researched any wt loss meds   She is very discouraged that she's been plateauing for the last 3-4 months. She says she's currently at the weight when she gave birth to her daughter several years ago. She's been using my fitness pal and really tracking calories but it just doesn't seem to be moving. 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 She was eating out 3 times a week and has cut that down to once. She goes to [employer] and typically gets the grilled chicken and salad. She also was drinking 2 sodas a day and has completely cut that out as well. She did see a nutritionist years ago before her first child and says she still has some of the paperwork at home.  Past medical history, Surgical history, Family history not pertinant except as noted below, Social history, Allergies, and medications have been entered into the medical record, reviewed, and corrections made.   Review of Systems: No fevers, chills, night sweats, weight loss, chest pain, or shortness of breath.   Objective:    General: Well Developed, well nourished, and in no acute distress.  Neuro: Alert and oriented x3, extra-ocular muscles intact, sensation grossly intact.  HEENT: Normocephalic, atraumatic  Skin: Warm and dry, no rashes. Cardiac: Regular rate and rhythm, no murmurs rubs or gallops, no lower extremity edema.  Respiratory: Clear to auscultation bilaterally. Not using accessory muscles, speaking in full sentences.   Impression and Recommendations:   Abnormal weight gain/obesity/BMI 34-we discussed several strategies. I really want her to work on increasing her aerobic activity. She can probably  do this while doing her daily chores around the farm. I want her to get her heart rate up where she is sweating and it's difficult to saying. We also discussed possible nutrition referral. She wants to first go through the paperwork that she has at home for nutrition she saw several years ago and try to work on that. We also discussed make sure that she is getting adequate protein intake especially in the morning for breakfast. Continue with my fitness pal to track calories. We also discussed possibly using one of the weight loss medications. Will start with Sexenda. Discussed potential side effects. Prescription printed and coupon card provided. In the meantime we will also check some lab work to make sure that there is no thyroid abnormality or sign of early diabetes etc.  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   Time spent 25 min, > 50% spent counseling about weight gain, obesity.

## 2016-05-03 DIAGNOSIS — R635 Abnormal weight gain: Secondary | ICD-10-CM | POA: Diagnosis not present

## 2016-05-03 DIAGNOSIS — Z13220 Encounter for screening for lipoid disorders: Secondary | ICD-10-CM | POA: Diagnosis not present

## 2016-05-03 DIAGNOSIS — Z6835 Body mass index (BMI) 35.0-35.9, adult: Secondary | ICD-10-CM | POA: Diagnosis not present

## 2016-05-03 LAB — COMPLETE METABOLIC PANEL WITH GFR
ALK PHOS: 69 U/L (ref 33–115)
ALT: 24 U/L (ref 6–29)
AST: 15 U/L (ref 10–30)
Albumin: 4.2 g/dL (ref 3.6–5.1)
BILIRUBIN TOTAL: 0.5 mg/dL (ref 0.2–1.2)
BUN: 12 mg/dL (ref 7–25)
CALCIUM: 9 mg/dL (ref 8.6–10.2)
CO2: 25 mmol/L (ref 20–31)
CREATININE: 0.75 mg/dL (ref 0.50–1.10)
Chloride: 104 mmol/L (ref 98–110)
Glucose, Bld: 99 mg/dL (ref 65–99)
Potassium: 4.4 mmol/L (ref 3.5–5.3)
Sodium: 139 mmol/L (ref 135–146)
TOTAL PROTEIN: 7 g/dL (ref 6.1–8.1)

## 2016-05-03 LAB — LIPID PANEL
CHOLESTEROL: 178 mg/dL (ref 125–200)
HDL: 42 mg/dL — AB (ref >=46)
LDL CALC: 116 mg/dL (ref <130)
TRIGLYCERIDES: 98 mg/dL (ref <150)
Total CHOL/HDL Ratio: 4.2 Ratio (ref <=5.0)
VLDL: 20 mg/dL (ref <30)

## 2016-05-03 LAB — HEMOGLOBIN A1C
Hgb A1c MFr Bld: 5.7 % — ABNORMAL HIGH (ref <5.7)
MEAN PLASMA GLUCOSE: 117 mg/dL

## 2016-05-03 LAB — CBC
HEMATOCRIT: 42.7 % (ref 35.0–45.0)
HEMOGLOBIN: 14.5 g/dL (ref 11.7–15.5)
MCH: 28.7 pg (ref 27.0–33.0)
MCHC: 34 g/dL (ref 32.0–36.0)
MCV: 84.4 fL (ref 80.0–100.0)
MPV: 12.2 fL (ref 7.5–12.5)
Platelets: 265 10*3/uL (ref 140–400)
RBC: 5.06 MIL/uL (ref 3.80–5.10)
RDW: 13.2 % (ref 11.0–15.0)
WBC: 6.6 10*3/uL (ref 3.8–10.8)

## 2016-05-03 LAB — TSH: TSH: 1.44 mIU/L

## 2016-05-10 ENCOUNTER — Other Ambulatory Visit: Payer: Self-pay | Admitting: *Deleted

## 2016-05-10 ENCOUNTER — Telehealth: Payer: Self-pay | Admitting: *Deleted

## 2016-05-10 DIAGNOSIS — R635 Abnormal weight gain: Secondary | ICD-10-CM

## 2016-05-10 DIAGNOSIS — Z6835 Body mass index (BMI) 35.0-35.9, adult: Secondary | ICD-10-CM

## 2016-05-10 MED ORDER — LIRAGLUTIDE -WEIGHT MANAGEMENT 18 MG/3ML ~~LOC~~ SOPN: 0.6000 mg | PEN_INJECTOR | Freq: Every day | SUBCUTANEOUS | Status: DC

## 2016-05-10 NOTE — Telephone Encounter (Signed)
Received PA for saxenda. PA was generated because of dispensed amount which was 6 ml. Changed dispensed amt to 4 pens and hopefully this will be approved. Called pt to let her know that she should get a total of 4 pens for 1 month. Follow the directions to titrate up. Advised if she gets nauseous its ok to back down to previous dose. Patient voiced understanding

## 2016-05-13 ENCOUNTER — Telehealth: Payer: Self-pay | Admitting: *Deleted

## 2016-05-13 ENCOUNTER — Telehealth: Payer: Self-pay

## 2016-05-13 NOTE — Telephone Encounter (Signed)
Prior auth submitted and approved for saxenda. Approval # is  [id number]. Left message for patient on her voicemail

## 2016-05-13 NOTE — Telephone Encounter (Signed)
The next step would be for her to call her insurance to see if any of the weight loss medications are covered. If they are then she can let me know which ones and then we can make a decision from there.

## 2016-05-14 MED FILL — NADOLOL 20 MG TABLET: 20 | 30 days supply | Qty: 90 | Fill #4

## 2016-05-14 MED FILL — SAXENDA 18 MG/3 ML PEN: 18 | 75 days supply | Qty: 15 | Fill #0

## 2016-05-15 MED FILL — UNIFINE PENTIPS 8MM 31G: 31G X 8 MM | 90 days supply | Qty: 100 | Fill #0

## 2016-05-30 MED FILL — CYCLOBENZAPRINE 5 MG TABLET: 5 | 30 days supply | Qty: 30 | Fill #3

## 2016-05-30 MED FILL — SUMATRIPTAN SUCC 100 MG TAB: 100 | 30 days supply | Qty: 9 | Fill #2

## 2016-06-11 MED FILL — NADOLOL 20 MG TABLET: 20 | 30 days supply | Qty: 90 | Fill #5

## 2016-06-13 ENCOUNTER — Encounter: Payer: Self-pay | Admitting: Family Medicine

## 2016-06-13 ENCOUNTER — Ambulatory Visit (INDEPENDENT_AMBULATORY_CARE_PROVIDER_SITE_OTHER): Payer: 59 | Admitting: Family Medicine

## 2016-06-13 VITALS — BP 106/67 | HR 91 | Wt 210.0 lb

## 2016-06-13 DIAGNOSIS — Z6833 Body mass index (BMI) 33.0-33.9, adult: Secondary | ICD-10-CM | POA: Diagnosis not present

## 2016-06-13 DIAGNOSIS — R635 Abnormal weight gain: Secondary | ICD-10-CM | POA: Diagnosis not present

## 2016-06-13 DIAGNOSIS — Z6835 Body mass index (BMI) 35.0-35.9, adult: Secondary | ICD-10-CM | POA: Diagnosis not present

## 2016-06-13 MED ORDER — LIRAGLUTIDE -WEIGHT MANAGEMENT 18 MG/3ML ~~LOC~~ SOPN: 2.4000 mg | PEN_INJECTOR | Freq: Every day | SUBCUTANEOUS | Status: DC

## 2016-06-13 NOTE — Progress Notes (Signed)
Subjective:    CC: Need for weight loss  HPI:  Follow-up abnormal weight gain/obesity-she is now on Sexenda. She is doing well overall. She is going to [facility] in September and wants to be down to 200 lb. Overall she is actually doing really well on the Sexenda. She really only had one day where she had a little bit of stomach irritability. No significant nausea vomiting etc. she has been exercising about 3 days per week and no says she really needs to be susceptible see significant change. She has lost 4 pounds since she started the medication about 4 weeks ago. She is up to 1.2 mg. She says it's been much easier to control her portion sizes. She still struggles with going out to eat and not overeating.  Past medical history, Surgical history, Family history not pertinant except as noted below, Social history, Allergies, and medications have been entered into the medical record, reviewed, and corrections made.   Review of Systems: No fevers, chills, night sweats, weight loss, chest pain, or shortness of breath.   Objective:    General: Well Developed, well nourished, and in no acute distress.  Neuro: Alert and oriented x3, extra-ocular muscles intact, sensation grossly intact.  HEENT: Normocephalic, atraumatic  Skin: Warm and dry, no rashes. Cardiac: Regular rate and rhythm, no murmurs rubs or gallops, no lower extremity edema.  Respiratory: Clear to auscultation bilaterally. Not using accessory muscles, speaking in full sentences.   Impression and Recommendations:    Abnormal weight gain/obesity/BMI 33 - doing very well. Down 4 pounds. Go ahead and increase Sexenda to 1.8 mg. After 1-2 weeks if tolerating well okay to increase to 2.4 mg. Then she'll start to see the maximum potential of the medication. Plan is to increase exercise by 2 more days per week. And to really continue to work on getting some protein for breakfast. She is doing a little better but says she still struggling  some..  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 Try to shoot for 5 days of exercise, instead of 3.  Medication: Increase the Saxenda to 1.8mg . After 1-2 weeks can increase to 2.4mg  if you are tolerating it well.    Time spent 15 min, > 50% spent counseling about weight loss and obesity.

## 2016-06-13 NOTE — Patient Instructions (Signed)
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 Try to shoot for 5 days of exercise, instead of 3.  Medication: Increase the Saxenda to 1.8mg . After 1-2 weeks can increase to 2.4mg  if you are tolerating it well.

## 2016-07-03 MED FILL — SUMATRIPTAN SUCC 100 MG TAB: 100 | 30 days supply | Qty: 9 | Fill #3

## 2016-07-03 MED FILL — CYCLOBENZAPRINE 5 MG TABLET: 5 | 30 days supply | Qty: 30 | Fill #4

## 2016-07-04 MED FILL — SAXENDA 18 MG/3 ML PEN: 18 | 37 days supply | Qty: 15 | Fill #0

## 2016-07-15 MED FILL — NADOLOL 20 MG TABLET: 20 | 30 days supply | Qty: 90 | Fill #6

## 2016-07-25 ENCOUNTER — Ambulatory Visit (INDEPENDENT_AMBULATORY_CARE_PROVIDER_SITE_OTHER): Payer: 59 | Admitting: Family Medicine

## 2016-07-25 VITALS — BP 114/77 | HR 90 | Wt 208.0 lb

## 2016-07-25 DIAGNOSIS — R635 Abnormal weight gain: Secondary | ICD-10-CM | POA: Diagnosis not present

## 2016-07-25 NOTE — Progress Notes (Signed)
   Subjective:    Patient ID: Lisa Cannon, female    DOB: 09-May-1981, 35 y.o.   MRN: [id number]  HPI    Review of Systems     Objective:   Physical Exam        Assessment & Plan:  Agree with above.  F/U in 6 weeks.   Beatrice Lecher, MD

## 2016-07-25 NOTE — Progress Notes (Signed)
Patient is here for blood pressure and weight check. Denies any trouble sleeping, palpitations, or any other medication problems. Patient has lost weight. A refill for Kirke Shaggy is not required at this time, Patient reports she still has some at home. Advised our goal is for 4 lbs weight loss per month, Patient states she will make that her goal. Patient advised I will find out from PCP when she needs to follow up (4-6 weeks) and to keep her upcoming appointment with her PCP. Verbalized understanding, no further questions.

## 2016-08-01 MED FILL — CYCLOBENZAPRINE 5 MG TABLET: 5 | 30 days supply | Qty: 30 | Fill #5

## 2016-08-01 MED FILL — SUMATRIPTAN SUCC 100 MG TAB: 100 | 30 days supply | Qty: 9 | Fill #4

## 2016-08-08 MED FILL — NADOLOL 20 MG TABLET: 20 | 30 days supply | Qty: 90 | Fill #7

## 2016-08-19 ENCOUNTER — Ambulatory Visit: Payer: 59 | Admitting: Neurology

## 2016-08-22 ENCOUNTER — Other Ambulatory Visit: Payer: Self-pay | Admitting: Family Medicine

## 2016-08-22 ENCOUNTER — Other Ambulatory Visit: Payer: Self-pay

## 2016-08-22 DIAGNOSIS — R635 Abnormal weight gain: Secondary | ICD-10-CM

## 2016-08-22 DIAGNOSIS — Z6835 Body mass index (BMI) 35.0-35.9, adult: Secondary | ICD-10-CM

## 2016-08-22 MED ORDER — LIRAGLUTIDE -WEIGHT MANAGEMENT 18 MG/3ML ~~LOC~~ SOPN: 2.4000 mg | PEN_INJECTOR | Freq: Every day | SUBCUTANEOUS | 1 refills | Status: DC

## 2016-08-22 MED FILL — UNIFINE PENTIPS 8MM 31G: 31G X 8 MM | 90 days supply | Qty: 100 | Fill #0

## 2016-08-22 MED FILL — SAXENDA 18 MG/3 ML PEN: 18 | 37 days supply | Qty: 15 | Fill #0

## 2016-08-30 MED FILL — CYCLOBENZAPRINE 5 MG TABLET: 5 | 30 days supply | Qty: 30 | Fill #0

## 2016-09-06 MED FILL — NADOLOL 20 MG TABLET: 20 | 30 days supply | Qty: 90 | Fill #8

## 2016-09-06 MED FILL — SUMATRIPTAN SUCC 100 MG TAB: 100 | 30 days supply | Qty: 9 | Fill #5

## 2016-09-12 ENCOUNTER — Ambulatory Visit (INDEPENDENT_AMBULATORY_CARE_PROVIDER_SITE_OTHER): Payer: 59 | Admitting: Neurology

## 2016-09-12 ENCOUNTER — Encounter: Payer: Self-pay | Admitting: Neurology

## 2016-09-12 DIAGNOSIS — G43009 Migraine without aura, not intractable, without status migrainosus: Secondary | ICD-10-CM

## 2016-09-12 DIAGNOSIS — M542 Cervicalgia: Secondary | ICD-10-CM | POA: Diagnosis not present

## 2016-09-12 MED ORDER — CYCLOBENZAPRINE HCL 5 MG PO TABS: ORAL_TABLET | ORAL | 3 refills | Status: DC

## 2016-09-12 MED ORDER — FROVATRIPTAN SUCCINATE 2.5 MG PO TABS: ORAL_TABLET | ORAL | 6 refills | Status: DC

## 2016-09-12 MED ORDER — SUMATRIPTAN SUCCINATE 100 MG PO TABS: ORAL_TABLET | ORAL | 3 refills | Status: DC

## 2016-09-12 MED ORDER — NADOLOL 20 MG PO TABS: ORAL_TABLET | ORAL | 3 refills | Status: DC

## 2016-09-12 MED FILL — FROVATRIPTAN SUCC 2.5 MG TA: 2.5 | 30 days supply | Qty: 9 | Fill #0

## 2016-09-12 NOTE — Progress Notes (Signed)
NEUROLOGY FOLLOW UP OFFICE NOTE  CHIANN PLASTER [id number]  HISTORY OF PRESENT ILLNESS: I had the pleasure of seeing Farhana Koglin in follow-up in the neurology clinic on 09/12/2016.  The patient was last seen 8 months ago for worsening migraines. On her last visit, she reported an improvement with migraines off birth control, but still with migraines once a week. Nadolol dose was increased to 60mg  daily. She reports that migraines occur once a month, around the time of her menstrual period. She would have a migraine on the day of her period, and have a couple before and after. She tried doing prophylactic naproxen, which was hit or miss. She takes Imitrex prn with good effect. She had a migraine today and is toward the end of her period. She denies any dizziness, diplopia, vision changes, focal numbness/tingling/weakness. No side effects on Nadolol. She is trying to lose weight. She has occasional neck pain, Flexeril helps. She may try PT once her daughter is in daycare longer.   HPI: This is a very pleasant 35 yo RH woman with a history of migraines since her pre-teen years. This increased in frequency and severity when she was in college, with several migraines every week. Headaches are usually over the left frontal and retroorbital region, at times radiating to the left side of her neck. This is associated with nausea, dizziness, tightening in her jaw area, and mild photo/phonophobia. No migraine auras. She was initially started on Topamax which caused more headaches and nausea. Butalbital does not help. She was switched to Corgard (Nadolol) 5 years ago, which significantly helped reduce headaches to once a month, usually a day before she would start her menstrual period. She stopped the medication when she became pregnant with her daughter. Since then, she would have 1 migraine/month on average, with good response to Imitrex.Migraine triggers include lack of sleep and allergies. Her father  and possibly paternal grandmother had migraines.   PAST MEDICAL HISTORY: Past Medical History:  Diagnosis Date  . Abnormal Pap smear of cervix   . Migraines     MEDICATIONS: Current Outpatient Prescriptions on File Prior to Visit  Medication Sig Dispense Refill  . cyclobenzaprine (FLEXERIL) 5 MG tablet TAKE 1 TABLET BY MOUTH AT BEDTIME AS NEEDED FOR NECK PAIN 30 tablet 11  . Liraglutide -Weight Management (SAXENDA) 18 MG/3ML SOPN Inject 2.4 mg into the skin daily. 4 pen 1  . nadolol (CORGARD) 20 MG tablet Take 3 tablets daily 90 tablet 11  . naproxen sodium (ALEVE) 220 MG tablet Take 220 mg by mouth daily as needed (for pain).    . SUMAtriptan (IMITREX) 100 MG tablet Take 100 mg by mouth at onset of headache. May repeat dose after 2 hours. Do not take more than 2/week. 10 tablet 11  . UNIFINE PENTIPS 31G X 8 MM MISC AS DIRECTED 100 each PRN   No current facility-administered medications on file prior to visit.     ALLERGIES: No Known Allergies  FAMILY HISTORY: Family History  Problem Relation Age of Onset  . Diabetes Maternal Aunt   . Hypertension Maternal Aunt   . Hypertension Maternal Grandfather   . Cancer Paternal Grandfather     lung cancer  . Cancer Paternal Grandmother     brain    SOCIAL HISTORY: Social History   Social History  . Marital status: Married    Spouse name: N/A  . Number of children: N/A  . Years of education: N/A   Occupational History  .  homemaker    Social History Main Topics  . Smoking status: Never Smoker  . Smokeless tobacco: Never Used  . Alcohol use No  . Drug use: No  . Sexual activity: Yes    Partners: Male    Birth control/ protection: Pill     Comment: trying to conceive   Other Topics Concern  . Not on file   Social History Narrative  . No narrative on file    REVIEW OF SYSTEMS: Constitutional: No fevers, chills, or sweats, no generalized fatigue, change in appetite Eyes: No visual changes, double vision, eye  pain Ear, nose and throat: No hearing loss, ear pain, nasal congestion, sore throat Cardiovascular: No chest pain, palpitations Respiratory:  No shortness of breath at rest or with exertion, wheezes GastrointestinaI: No nausea, vomiting, diarrhea, abdominal pain, fecal incontinence Genitourinary:  No dysuria, urinary retention or frequency Musculoskeletal:  + neck pain, no back pain Integumentary: No rash, pruritus, skin lesions Neurological: as above Psychiatric: No depression, insomnia, anxiety Endocrine: No palpitations, fatigue, diaphoresis, mood swings, change in appetite, change in weight, increased thirst Hematologic/Lymphatic:  No anemia, purpura, petechiae. Allergic/Immunologic: no itchy/runny eyes, nasal congestion, recent allergic reactions, rashes  PHYSICAL EXAM: Vitals:   09/12/16 1516  BP: 122/78  Pulse: 100  Temp: 98.2 F (36.8 C)   General: No acute distress Head:  Normocephalic/atraumatic Neck: supple, no paraspinal tenderness, full range of motion Heart:  Regular rate and rhythm Lungs:  Clear to auscultation bilaterally Back: No paraspinal tenderness Skin/Extremities: No rash, no edema Neurological Exam: alert and oriented to person, place, and time. No aphasia or dysarthria. Fund of knowledge is appropriate.  Recent and remote memory are intact.  Attention and concentration are normal.    Able to name objects and repeat phrases. Cranial nerves: Pupils equal, round, reactive to light.  Fundoscopic exam unremarkable, no papilledema. Extraocular movements intact with no nystagmus. Visual fields full. Facial sensation intact. No facial asymmetry. Tongue, uvula, palate midline.  Motor: Bulk and tone normal, muscle strength 5/5 throughout with no pronator drift.  Sensation to light touch intact.  No extinction to double simultaneous stimulation.  Deep tendon reflexes 2+ throughout, toes downgoing.  Finger to nose testing intact.  Gait narrow-based and steady, able to tandem  walk adequately.  Romberg negative.  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 Headaches have improved with discontinuation of OCPs, however she continues have migraines worse around the time of her menstrual period. She tried prophylactic Aleve which was hit or miss. She will try doing a regimen of Frova 2.5mg  BID x 5 days during her menstrual period, side effects were discussed. Continue Nadolol 60mg  daily. Continue prn Flexeril for neck pain, she hopes to have time for neck PT once her daughter is in daycare longer. Continue prn Imitrex and knows to minimize rescue medications to 2-3 a week to avoid rebound headaches. She will continue headache calendar and follow-up in 6 months.   Thank you for allowing me to participate in her care.  Please do not hesitate to call for any questions or concerns.  The duration of this appointment visit was 25 minutes of face-to-face time with the patient.  Greater than 50% of this time was spent in counseling, explanation of diagnosis, planning of further management, and coordination of care.   Ellouise Newer, M.D.   CC: Dr. Madilyn Fireman

## 2016-09-12 NOTE — Patient Instructions (Signed)
1. Continue Corgard 20mg : Take 3 tablet daily 2. Around the time of your period, take Frova 2.5mg  1 tablet twice a day for 5 days 3. Take Imitrex as needed at onset of migraine, do not take more than 2 a week 4. Take Flexeril as needed for neck pain 5. Follow-up in 6 months, call for any changes

## 2016-09-13 ENCOUNTER — Encounter: Payer: Self-pay | Admitting: Neurology

## 2016-09-26 MED FILL — CYCLOBENZAPRINE 5 MG TABLET: 5 | 30 days supply | Qty: 30 | Fill #1

## 2016-09-27 ENCOUNTER — Telehealth: Payer: Self-pay | Admitting: *Deleted

## 2016-09-27 NOTE — Telephone Encounter (Signed)
PA (renewal) submitted and approved for saxenda.approval # is [id number].called and left message on pharm vm

## 2016-10-08 MED FILL — FROVATRIPTAN SUCC 2.5 MG TA: 2.5 | 30 days supply | Qty: 9 | Fill #1

## 2016-10-08 MED FILL — SUMATRIPTAN SUCC 100 MG TAB: 100 | 30 days supply | Qty: 9 | Fill #6

## 2016-10-16 MED FILL — NADOLOL 20 MG TABLET: 20 | 30 days supply | Qty: 90 | Fill #9

## 2016-10-31 MED FILL — CYCLOBENZAPRINE 5 MG TABLET: 5 | 30 days supply | Qty: 30 | Fill #2

## 2016-11-06 MED FILL — SUMATRIPTAN SUCC 100 MG TAB: 100 | 30 days supply | Qty: 9 | Fill #0

## 2016-11-14 MED FILL — NADOLOL 20 MG TABLET: 20 | 30 days supply | Qty: 90 | Fill #10

## 2016-11-27 MED FILL — CYCLOBENZAPRINE 5 MG TABLET: 5 | 30 days supply | Qty: 30 | Fill #3

## 2016-12-11 MED FILL — SUMATRIPTAN SUCC 100 MG TAB: 100 | 30 days supply | Qty: 9 | Fill #1

## 2016-12-18 MED FILL — NADOLOL 20 MG TABLET: 20 | 30 days supply | Qty: 90 | Fill #11

## 2016-12-31 MED FILL — FROVATRIPTAN SUCC 2.5 MG TA: 2.5 | 30 days supply | Qty: 9 | Fill #2

## 2016-12-31 MED FILL — CYCLOBENZAPRINE 5 MG TABLET: 5 | 30 days supply | Qty: 30 | Fill #4

## 2017-01-13 MED FILL — SUMATRIPTAN SUCC 100 MG TAB: 100 | 30 days supply | Qty: 9 | Fill #2

## 2017-01-14 MED FILL — NADOLOL 20 MG TABLET: 20 | 90 days supply | Qty: 270 | Fill #0

## 2017-01-30 MED FILL — CYCLOBENZAPRINE 5 MG TABLET: 5 | 90 days supply | Qty: 90 | Fill #0

## 2017-02-12 MED FILL — SUMATRIPTAN SUCC 100 MG TAB: 100 | 30 days supply | Qty: 9 | Fill #3

## 2017-03-14 MED FILL — SUMATRIPTAN SUCC 100 MG TAB: 100 | 30 days supply | Qty: 9 | Fill #4

## 2017-03-28 ENCOUNTER — Encounter: Payer: Self-pay | Admitting: Neurology

## 2017-03-28 ENCOUNTER — Ambulatory Visit (INDEPENDENT_AMBULATORY_CARE_PROVIDER_SITE_OTHER): Payer: 59 | Admitting: Neurology

## 2017-03-28 VITALS — BP 118/84 | HR 81 | Ht 66.0 in | Wt 210.0 lb

## 2017-03-28 DIAGNOSIS — G43009 Migraine without aura, not intractable, without status migrainosus: Secondary | ICD-10-CM | POA: Diagnosis not present

## 2017-03-28 MED ORDER — FROVATRIPTAN SUCCINATE 2.5 MG PO TABS: ORAL_TABLET | ORAL | 6 refills | Status: DC

## 2017-03-28 MED ORDER — ZONISAMIDE 100 MG PO CAPS: ORAL_CAPSULE | ORAL | 11 refills | Status: DC

## 2017-03-28 MED FILL — FROVATRIPTAN SUCC 2.5 MG TA: 2.5 | 30 days supply | Qty: 9 | Fill #0

## 2017-03-28 MED FILL — ZONISAMIDE 100 MG CAPSULE: 100 | 30 days supply | Qty: 30 | Fill #0

## 2017-03-28 NOTE — Progress Notes (Signed)
NEUROLOGY FOLLOW UP OFFICE NOTE  Lisa Cannon [id number]  HISTORY OF PRESENT ILLNESS: I had the pleasure of seeing Lisa Cannon in follow-up in the neurology clinic on 03/28/2017.  The patient was last seen 6 months ago for worsening migraines. On her last visit, she reported an improvement with migraines off birth control, but still had worse migraines around the time of her menstrual period. She was given a prescription for Frova to take as a preventative around the time of her period, which has helped. She however continues to have 8 migraines a month, clustering during and after her period. Imitrex helps abort the pain. She has occasional associated nausea around twice a year, no visual changes or focal symptoms. She had a hysterectomy last year. Sleep is good. She has good response to Flexeril with no significant neck pain.   HPI: This is a very pleasant 36 yo RH woman with a history of migraines since her pre-teen years. This increased in frequency and severity when she was in college, with several migraines every week. Headaches are usually over the left frontal and retroorbital region, at times radiating to the left side of her neck. This is associated with nausea, dizziness, tightening in her jaw area, and mild photo/phonophobia. No migraine auras. She was initially started on Topamax which caused more headaches and nausea. Butalbital does not help. She was switched to Corgard (Nadolol) 5 years ago, which significantly helped reduce headaches to once a month, usually a day before she would start her menstrual period. She stopped the medication when she became pregnant with her daughter. Since then, she would have 1 migraine/month on average, with good response to Imitrex.Migraine triggers include lack of sleep and allergies. Her father and possibly paternal grandmother had migraines.   PAST MEDICAL HISTORY: Past Medical History:  Diagnosis Date  . Abnormal Pap smear of cervix    . Migraines     MEDICATIONS: Current Outpatient Prescriptions on File Prior to Visit  Medication Sig Dispense Refill  . cyclobenzaprine (FLEXERIL) 5 MG tablet TAKE 1 TABLET BY MOUTH AT BEDTIME AS NEEDED FOR NECK PAIN 90 tablet 3  . frovatriptan (FROVA) 2.5 MG tablet Take 1 tablet twice a day for 5 days around the time of your menstrual period 10 tablet 6  . Liraglutide -Weight Management (SAXENDA) 18 MG/3ML SOPN Inject 2.4 mg into the skin daily. 4 pen 1  . nadolol (CORGARD) 20 MG tablet Take 3 tablets daily 270 tablet 3  . naproxen sodium (ALEVE) 220 MG tablet Take 220 mg by mouth daily as needed (for pain).    . SUMAtriptan (IMITREX) 100 MG tablet Take 100 mg by mouth at onset of headache. May repeat dose after 2 hours. Do not take more than 2/week. 30 tablet 3  . UNIFINE PENTIPS 31G X 8 MM MISC AS DIRECTED 100 each PRN   No current facility-administered medications on file prior to visit.     ALLERGIES: No Known Allergies  FAMILY HISTORY: Family History  Problem Relation Age of Onset  . Hypertension Maternal Grandfather   . Cancer Paternal Grandfather     lung cancer  . Cancer Paternal Grandmother     brain  . Diabetes Maternal Aunt   . Hypertension Maternal Aunt     SOCIAL HISTORY: Social History   Social History  . Marital status: Married    Spouse name: N/A  . Number of children: N/A  . Years of education: N/A   Occupational History  . homemaker  Social History Main Topics  . Smoking status: Never Smoker  . Smokeless tobacco: Never Used  . Alcohol use No  . Drug use: No  . Sexual activity: Yes    Partners: Male    Birth control/ protection: Pill     Comment: trying to conceive   Other Topics Concern  . Not on file   Social History Narrative  . No narrative on file    REVIEW OF SYSTEMS: Constitutional: No fevers, chills, or sweats, no generalized fatigue, change in appetite Eyes: No visual changes, double vision, eye pain Ear, nose and throat:  No hearing loss, ear pain, nasal congestion, sore throat Cardiovascular: No chest pain, palpitations Respiratory:  No shortness of breath at rest or with exertion, wheezes GastrointestinaI: No nausea, vomiting, diarrhea, abdominal pain, fecal incontinence Genitourinary:  No dysuria, urinary retention or frequency Musculoskeletal:  + neck pain, no back pain Integumentary: No rash, pruritus, skin lesions Neurological: as above Psychiatric: No depression, insomnia, anxiety Endocrine: No palpitations, fatigue, diaphoresis, mood swings, change in appetite, change in weight, increased thirst Hematologic/Lymphatic:  No anemia, purpura, petechiae. Allergic/Immunologic: no itchy/runny eyes, nasal congestion, recent allergic reactions, rashes  PHYSICAL EXAM: Vitals:   03/28/17 1549  BP: 118/84  Pulse: 81   General: No acute distress Head:  Normocephalic/atraumatic Neck: supple, no paraspinal tenderness, full range of motion Heart:  Regular rate and rhythm Lungs:  Clear to auscultation bilaterally Back: No paraspinal tenderness Skin/Extremities: No rash, no edema Neurological Exam: alert and oriented to person, place, and time. No aphasia or dysarthria. Fund of knowledge is appropriate.  Recent and remote memory are intact.  Attention and concentration are normal.    Able to name objects and repeat phrases. Cranial nerves: Pupils equal, round, reactive to light.  Fundoscopic exam unremarkable, no papilledema. Extraocular movements intact with no nystagmus. Visual fields full. Facial sensation intact. No facial asymmetry. Tongue, uvula, palate midline.  Motor: Bulk and tone normal, muscle strength 5/5 throughout with no pronator drift.  Sensation to light touch intact.  No extinction to double simultaneous stimulation.  Deep tendon reflexes 2+ throughout, toes downgoing.  Finger to nose testing intact.  Gait narrow-based and steady, able to tandem walk adequately.  Romberg  negative.  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 Headaches have improved with discontinuation of OCPs, however she continues have 8 migraines a month, worse around the time of her menstrual period. She has noticed an improvement with taking a regimen of Frova 2.5mg  BID x 5 days during her menstrual period. With continued migraines, we discussed adding on another preventative medication, Zonisamide 100mg  qhs, side effects were discussed. Continue Nadolol 60mg  daily. Continue prn Flexeril for neck pain. Continue prn Imitrex and knows to minimize rescue medications to 2-3 a week to avoid rebound headaches. She will continue headache calendar and follow-up in 3 months.   Thank you for allowing me to participate in her care.  Please do not hesitate to call for any questions or concerns.  The duration of this appointment visit was 25 minutes of face-to-face time with the patient.  Greater than 50% of this time was spent in counseling, explanation of diagnosis, planning of further management, and coordination of care.   Lisa Cannon, M.D.   CC: Dr. Madilyn Fireman

## 2017-03-28 NOTE — Patient Instructions (Signed)
1. Start Zonisamide 100mg : Take 1 capsule at night 2. Continue all your other medicatons 3. Follow-up in 3 months, call for any changes

## 2017-04-02 ENCOUNTER — Encounter: Payer: Self-pay | Admitting: Neurology

## 2017-04-15 MED FILL — SUMATRIPTAN SUCC 100 MG TAB: 100 | 30 days supply | Qty: 9 | Fill #5

## 2017-04-16 MED FILL — NADOLOL 20 MG TAB: 20 | 90 days supply | Qty: 270 | Fill #1

## 2017-04-29 MED FILL — CYCLOBENZAPRINE 5 MG TABLET: 5 | 90 days supply | Qty: 90 | Fill #1

## 2017-05-13 MED FILL — SUMATRIPTAN SUCC 100 MG TAB: 100 | 30 days supply | Qty: 9 | Fill #6

## 2017-05-13 MED FILL — FROVATRIPTAN SUCC 2.5 MG TA: 2.5 | 30 days supply | Qty: 9 | Fill #1

## 2017-06-09 MED FILL — SUMATRIPTAN SUCC 100 MG TAB: 100 | 30 days supply | Qty: 9 | Fill #7

## 2017-06-09 MED FILL — FROVATRIPTAN SUCC 2.5 MG TA: 2.5 | 30 days supply | Qty: 9 | Fill #2

## 2017-07-07 MED FILL — FROVATRIPTAN SUCC 2.5 MG TA: 2.5 | 30 days supply | Qty: 9 | Fill #3

## 2017-07-07 MED FILL — SUMATRIPTAN SUCC 100 MG TAB: 100 | 30 days supply | Qty: 9 | Fill #8

## 2017-07-11 ENCOUNTER — Encounter: Payer: Self-pay | Admitting: Neurology

## 2017-07-11 ENCOUNTER — Ambulatory Visit (INDEPENDENT_AMBULATORY_CARE_PROVIDER_SITE_OTHER): Payer: 59 | Admitting: Neurology

## 2017-07-11 VITALS — BP 112/68 | HR 78 | Wt 209.0 lb

## 2017-07-11 DIAGNOSIS — G43009 Migraine without aura, not intractable, without status migrainosus: Secondary | ICD-10-CM | POA: Diagnosis not present

## 2017-07-11 DIAGNOSIS — M542 Cervicalgia: Secondary | ICD-10-CM | POA: Diagnosis not present

## 2017-07-11 MED ORDER — GABAPENTIN 100 MG PO CAPS: ORAL_CAPSULE | ORAL | 6 refills | Status: DC

## 2017-07-11 MED FILL — GABAPENTIN 100 MG CAPS: 100 | 30 days supply | Qty: 30 | Fill #0

## 2017-07-11 NOTE — Patient Instructions (Signed)
1. Start low dose Gabapentin 100mg : Take 1 capsule at night 2. Continue all your other medications 3. Follow-up in 4 months, call for any changes

## 2017-07-11 NOTE — Progress Notes (Signed)
NEUROLOGY FOLLOW UP OFFICE NOTE  Lisa Cannon [id number]  HISTORY OF PRESENT ILLNESS: I had the pleasure of seeing Lisa Cannon in follow-up in the neurology clinic on 07/11/2017.  The patient was last seen 3 months ago for worsening migraines. Her daughter [name] is with her today. She continues to report 8 migraines a month, clustering during and after her menstrual period. She reports that 5 or 6 of the migraines occur during her menstrual period. She takes Frova as preventative around the time of her period, which does help more before the onset of her period, but otherwise she has Imitrex for rescue. She tried Zonisamide on her last visit, this caused excruciating headaches and nausea after taking medication for 3 days, symptoms stopped immediately after stopping medication. She has neck pain and takes Flexeril with good response. She denies any dizziness, vision changes, focal numbness/tingling/weakness, no falls.   HPI: This is a very pleasant 36 yo RH woman with a history of migraines since her pre-teen years. This increased in frequency and severity when she was in college, with several migraines every week. Headaches are usually over the left frontal and retroorbital region, at times radiating to the left side of her neck. This is associated with nausea, dizziness, tightening in her jaw area, and mild photo/phonophobia. No migraine auras. She was initially started on Topamax which caused more headaches and nausea. Butalbital does not help. She was switched to Corgard (Nadolol) 5 years ago, which significantly helped reduce headaches to once a month, usually a day before she would start her menstrual period. She stopped the medication when she became pregnant with her daughter. Since then, she would have 1 migraine/month on average, with good response to Imitrex.Migraine triggers include lack of sleep and allergies. Her father and possibly paternal grandmother had migraines.    Prior Preventatives: Topamax, Zonisamide Prior rescue medications: Butaltital  PAST MEDICAL HISTORY: Past Medical History:  Diagnosis Date  . Abnormal Pap smear of cervix   . Migraines     MEDICATIONS: Current Outpatient Prescriptions on File Prior to Visit  Medication Sig Dispense Refill  . cyclobenzaprine (FLEXERIL) 5 MG tablet TAKE 1 TABLET BY MOUTH AT BEDTIME AS NEEDED FOR NECK PAIN 90 tablet 3  . fluticasone (FLONASE) 50 MCG/ACT nasal spray Place into both nostrils as needed for allergies or rhinitis.    . frovatriptan (FROVA) 2.5 MG tablet Take 1 tablet twice a day for 5 days around the time of your menstrual period 10 tablet 6  . Multiple Vitamin (MULTIVITAMIN) capsule Take 1 capsule by mouth daily.    . nadolol (CORGARD) 20 MG tablet Take 3 tablets daily 270 tablet 3  . naproxen sodium (ALEVE) 220 MG tablet Take 220 mg by mouth daily as needed (for pain).    . SUMAtriptan (IMITREX) 100 MG tablet Take 100 mg by mouth at onset of headache. May repeat dose after 2 hours. Do not take more than 2/week. 30 tablet 3   No current facility-administered medications on file prior to visit.     ALLERGIES: No Known Allergies  FAMILY HISTORY: Family History  Problem Relation Age of Onset  . Hypertension Maternal Grandfather   . Cancer Paternal Grandfather        lung cancer  . Cancer Paternal Grandmother        brain  . Diabetes Maternal Aunt   . Hypertension Maternal Aunt     SOCIAL HISTORY: Social History   Social History  . Marital status: Married  Spouse name: N/A  . Number of children: N/A  . Years of education: N/A   Occupational History  . homemaker    Social History Main Topics  . Smoking status: Never Smoker  . Smokeless tobacco: Never Used  . Alcohol use No  . Drug use: No  . Sexual activity: Yes    Partners: Male    Birth control/ protection: Pill     Comment: trying to conceive   Other Topics Concern  . Not on file   Social History  Narrative  . No narrative on file    REVIEW OF SYSTEMS: Constitutional: No fevers, chills, or sweats, no generalized fatigue, change in appetite Eyes: No visual changes, double vision, eye pain Ear, nose and throat: No hearing loss, ear pain, nasal congestion, sore throat Cardiovascular: No chest pain, palpitations Respiratory:  No shortness of breath at rest or with exertion, wheezes GastrointestinaI: No nausea, vomiting, diarrhea, abdominal pain, fecal incontinence Genitourinary:  No dysuria, urinary retention or frequency Musculoskeletal:  + neck pain, no back pain Integumentary: No rash, pruritus, skin lesions Neurological: as above Psychiatric: No depression, insomnia, anxiety Endocrine: No palpitations, fatigue, diaphoresis, mood swings, change in appetite, change in weight, increased thirst Hematologic/Lymphatic:  No anemia, purpura, petechiae. Allergic/Immunologic: no itchy/runny eyes, nasal congestion, recent allergic reactions, rashes  PHYSICAL EXAM: Vitals:   07/11/17 0830  BP: 112/68  Pulse: 78  SpO2: 98%   General: No acute distress Head:  Normocephalic/atraumatic Neck: supple, no paraspinal tenderness, full range of motion Heart:  Regular rate and rhythm Lungs:  Clear to auscultation bilaterally Back: No paraspinal tenderness Skin/Extremities: No rash, no edema Neurological Exam: alert and oriented to person, place, and time. No aphasia or dysarthria. Fund of knowledge is appropriate.  Recent and remote memory are intact.  Attention and concentration are normal.    Able to name objects and repeat phrases. Cranial nerves: Pupils equal, round, reactive to light.  Fundoscopic exam unremarkable, no papilledema. Extraocular movements intact with no nystagmus. Visual fields full. Facial sensation intact. No facial asymmetry. Tongue, uvula, palate midline.  Motor: Bulk and tone normal, muscle strength 5/5 throughout with no pronator drift.  Sensation to light touch intact.   No extinction to double simultaneous stimulation.  Deep tendon reflexes 2+ throughout, toes downgoing.  Finger to nose testing intact.  Gait narrow-based and steady, able to tandem walk adequately.  Romberg negative.  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 Headaches have improved with discontinuation of OCPs, however she continues have 8 migraines a month, worse around the time of her menstrual period. Oral contraceptives made her feel worse, she tried tricycling in the past. She has noticed an improvement with taking a regimen of Frova 2.5mg  BID x 5 days during her menstrual period, but continues to have 8 migraines a month, mostly around her period. She is taking Nadolol 60mg  daily. She is hesitant to start another medication, but agrees to try low dose Gabapentin 100mg  qhs. Side effects discussed. We will uptitrate as tolerated. Continue prn Flexeril for neck pain. Continue prn Imitrex and knows to minimize rescue medications to 2-3 a week to avoid rebound headaches. She will continue headache calendar and follow-up in 4 months.   Thank you for allowing me to participate in her care.  Please do not hesitate to call for any questions or concerns.  The duration of this appointment visit was 25 minutes of face-to-face time with the patient.  Greater than 50% of this time was spent in counseling, explanation of diagnosis, planning of further management, and coordination of care.   Ellouise Newer, M.D.   CC: Dr. Madilyn Fireman

## 2017-07-14 ENCOUNTER — Encounter: Payer: Self-pay | Admitting: *Deleted

## 2017-07-14 ENCOUNTER — Emergency Department
Admission: EM | Admit: 2017-07-14 | Discharge: 2017-07-14 | Disposition: A | Discharge status: Home / Self Care | Payer: 59 | Source: Home / Self Care | Attending: Family Medicine | Admitting: Family Medicine

## 2017-07-14 DIAGNOSIS — H6592 Unspecified nonsuppurative otitis media, left ear: Secondary | ICD-10-CM

## 2017-07-14 DIAGNOSIS — J0100 Acute maxillary sinusitis, unspecified: Secondary | ICD-10-CM

## 2017-07-14 MED ORDER — AMOXICILLIN 875 MG PO TABS: 875.0000 mg | ORAL_TABLET | Freq: Two times a day (BID) | ORAL | 0 refills | Status: DC

## 2017-07-14 MED ORDER — PREDNISONE 20 MG PO TABS: ORAL_TABLET | ORAL | 0 refills | Status: DC

## 2017-07-14 NOTE — ED Triage Notes (Signed)
Pt c/o bilateral ear pain and fullness with sinus pressure x 2 days. Denies fever, cough, or nasal congestion.

## 2017-07-14 NOTE — Discharge Instructions (Signed)
Take plain guaifenesin (1200mg  extended release tabs such as Mucinex) twice daily, with plenty of water, for congestion.  May add Pseudoephedrine (30mg , one or two every 4 to 6 hours) for sinus congestion.  Get adequate rest.   May use Afrin nasal spray (or generic oxymetazoline) each morning for about 5 days and then discontinue.  Also recommend using saline nasal spray several times daily and saline nasal irrigation (AYR is a common brand).  Use Flonase nasal spray each morning after using Afrin nasal spray and saline nasal irrigation.

## 2017-07-14 NOTE — ED Provider Notes (Signed)
[name]    CSN: [id number] Arrival date & time: 07/14/17  2458     History   Chief Complaint Chief Complaint  Patient presents with  . Ear Pain    HPI Lisa Cannon is a 36 y.o. female.   Patient complains of two day history of bilateral ear pressure and fullness, worse on the left.  She notes decreased hearing on the left.  She also complains of soreness over both cheeks.  No recent URI.  No fevers, chills, and sweats.  She has a history of perennial rhinitis for which she uses Flonase nasal spray.   The history is provided by the patient.    Past Medical History:  Diagnosis Date  . Abnormal Pap smear of cervix   . Migraines     Patient Active Problem List   Diagnosis Date Noted  . Migraine without aura and without status migrainosus, not intractable 03/07/2015  . Neck pain 03/07/2015  . WEIGHT GAIN 04/06/2010  . GASTROESOPHAGEAL REFLUX DISEASE, MILD, HX OF 04/06/2010  . BENIGN POSITIONAL VERTIGO 11/21/2009  . ALLERGIC RHINITIS 10/12/2009  . COMMON MIGRAINE 05/06/2008    Past Surgical History:  Procedure Laterality Date  . CERVICAL BIOPSY  W/ LOOP ELECTRODE EXCISION  2002  . LAPAROSCOPIC BILATERAL SALPINGECTOMY Bilateral 09/12/2015   Procedure: LAPAROSCOPIC BILATERAL SALPINGECTOMY;  Surgeon: Emily Filbert, MD;  Location: [facility];  Service: Gynecology;  Laterality: Bilateral;  . LAPAROSCOPIC LYSIS OF ADHESIONS  09/12/2015   Procedure: LAPAROSCOPIC LYSIS OF ADHESIONS;  Surgeon: Emily Filbert, MD;  Location: [facility];  Service: Gynecology;;  . Arnetha Courser TOOTH EXTRACTION      OB History    Gravida Para Term Preterm AB Living   1 1 1  0 0 1   SAB TAB Ectopic Multiple Live Births   0 0 0 0 1       Home Medications    Prior to Admission medications   Medication Sig Start Date End Date Taking? Authorizing Provider  amoxicillin (AMOXIL) 875 MG tablet Take 1 tablet (875 mg total) by mouth 2 (two) times daily. 07/14/17   Kandra Nicolas, MD    cyclobenzaprine (FLEXERIL) 5 MG tablet TAKE 1 TABLET BY MOUTH AT BEDTIME AS NEEDED FOR NECK PAIN 09/12/16   Cameron Sprang, MD  fluticasone [facility]) 50 MCG/ACT nasal spray Place into both nostrils as needed for allergies or rhinitis.    [provider]  frovatriptan (FROVA) 2.5 MG tablet Take 1 tablet twice a day for 5 days around the time of your menstrual period 03/28/17   Cameron Sprang, MD  gabapentin (NEURONTIN) 100 MG capsule Take 1 capsule every night 07/11/17   Cameron Sprang, MD  Multiple Vitamin (MULTIVITAMIN) capsule Take 1 capsule by mouth daily.    [provider]  nadolol (CORGARD) 20 MG tablet Take 3 tablets daily 09/12/16   Cameron Sprang, MD  naproxen sodium (ALEVE) 220 MG tablet Take 220 mg by mouth daily as needed (for pain).    [provider]  predniSONE (DELTASONE) 20 MG tablet Take one tab by mouth twice daily for 3 days, then one daily for 3 days. Take with food. 07/14/17   Kandra Nicolas, MD  Probiotic Product (PROBIOTIC PO) Take by mouth.    [provider]  SUMAtriptan (IMITREX) 100 MG tablet Take 100 mg by mouth at onset of headache. May repeat dose after 2 hours. Do not take more than 2/week. 09/12/16   Ellouise Newer  M, MD    Family History Family History  Problem Relation Age of Onset  . Hypertension Maternal Grandfather   . Cancer Paternal Grandfather        lung cancer  . Cancer Paternal Grandmother        brain  . Diabetes Maternal Aunt   . Hypertension Maternal Aunt     Social History Social History  Substance Use Topics  . Smoking status: Never Smoker  . Smokeless tobacco: Never Used  . Alcohol use No     Allergies   Patient has no known allergies.   Review of Systems Review of Systems No sore throat No cough No pleuritic pain No wheezing + nasal congestion + post-nasal drainage + sinus pain/pressure No itchy/red eyes + earache No hemoptysis No SOB No fever?chills No nausea No vomiting No  abdominal pain No diarrhea No urinary symptoms No skin rash No fatigue No myalgias No headache Used OTC meds without relief   Physical Exam Triage Vital Signs ED Triage Vitals  Enc Vitals Group     BP 07/14/17 0841 113/77     Pulse Rate 07/14/17 0841 74     Resp 07/14/17 0841 16     Temp 07/14/17 0841 98.9 F (37.2 C)     Temp Source 07/14/17 0841 Oral     SpO2 07/14/17 0841 97 %     Weight 07/14/17 0841 209 lb (94.8 kg)     Height 07/14/17 0841 5\' 6"  (1.676 m)     Head Circumference --      Peak Flow --      Pain Score 07/14/17 0842 0     Pain Loc --      Pain Edu? --      Excl. in [place]? --    No data found.   Updated Vital Signs BP 113/77 (BP Location: Left Arm)   Pulse 74   Temp 98.9 F (37.2 C) (Oral)   Resp 16   Ht 5\' 6"  (1.676 m)   Wt 209 lb (94.8 kg)   LMP 07/13/2017   SpO2 97%   BMI 33.73 kg/m   Visual Acuity Right Eye Distance:   Left Eye Distance:   Bilateral Distance:    Right Eye Near:   Left Eye Near:    Bilateral Near:     Physical Exam Nursing notes and Vital Signs reviewed. Appearance:  Patient appears stated age, and in no acute distress Eyes:  Pupils are equal, round, and reactive to light and accomodation.  Extraocular movement is intact.  Conjunctivae are not inflamed  Ears:  Canals normal.  Right tympanic membrane appears normal.  Left tympanic membrane is erythematous superiorly with poor landmarks generally. Nose:  Congested turbinates.   Maxillary sinus tenderness is present.  Pharynx:  Normal Neck:  Supple.  No adenopathy.   Lungs:  Normal respiratory rate. Heart:  Normal rate Skin:  No rash present.    UC Treatments / Results  Labs (all labs ordered are listed, but only abnormal results are displayed) Labs Reviewed -  Tympanometry:  Right ear tympanogram normal; Left ear tympanogram wide  EKG  EKG Interpretation None       Radiology No results found.  Procedures Procedures (including critical care  time)  Medications Ordered in UC Medications - No data to display   Initial Impression / Assessment and Plan / UC Course  I have reviewed the triage vital signs and the nursing notes.  Pertinent labs & imaging results that were  available during my care of the patient were reviewed by me and considered in my medical decision making (see chart for details).    Begin amoxicillin and prednisone burst/taper. Take plain guaifenesin (1200mg  extended release tabs such as Mucinex) twice daily, with plenty of water, for congestion.  May add Pseudoephedrine (30mg , one or two every 4 to 6 hours) for sinus congestion.  Get adequate rest.   May use Afrin nasal spray (or generic oxymetazoline) each morning for about 5 days and then discontinue.  Also recommend using saline nasal spray several times daily and saline nasal irrigation (AYR is a common brand).  Use Flonase nasal spray each morning after using Afrin nasal spray and saline nasal irrigation. Followup with ENT if not improved 10 days.    Final Clinical Impressions(s) / UC Diagnoses   Final diagnoses:  Left otitis media with effusion  Acute maxillary sinusitis, recurrence not specified    New Prescriptions New Prescriptions   AMOXICILLIN (AMOXIL) 875 MG TABLET    Take 1 tablet (875 mg total) by mouth 2 (two) times daily.   PREDNISONE (DELTASONE) 20 MG TABLET    Take one tab by mouth twice daily for 3 days, then one daily for 3 days. Take with food.         Kandra Nicolas, MD 07/14/17 [phone]

## 2017-07-17 MED FILL — NADOLOL 20 MG TAB: 20 | 90 days supply | Qty: 270 | Fill #2

## 2017-07-18 ENCOUNTER — Encounter: Payer: Self-pay | Admitting: Physician Assistant

## 2017-07-18 ENCOUNTER — Ambulatory Visit (INDEPENDENT_AMBULATORY_CARE_PROVIDER_SITE_OTHER): Payer: 59 | Admitting: Physician Assistant

## 2017-07-18 VITALS — BP 123/78 | HR 75 | Temp 97.7°F | Resp 16 | Wt 209.0 lb

## 2017-07-18 DIAGNOSIS — H60332 Swimmer's ear, left ear: Secondary | ICD-10-CM

## 2017-07-18 MED ORDER — OFLOXACIN 0.3 % OT SOLN: 10.0000 [drp] | Freq: Every day | OTIC | 0 refills | Status: AC

## 2017-07-18 NOTE — Progress Notes (Signed)
HPI:                                                                Lisa Cannon is a 36 y.o. female who presents to [facility] today for left ear fullness  Patient complains of persistent left ear pressure and diminished hearing x 6 days. She was seen in UC on 8/13, tympanogram was wide on the left. She was treated with prednisone and amoxicillin. Reports she went swimming last Wednesday and got water in her ear.  Denies history of chronic ear infections.   Past Medical History:  Diagnosis Date  . Abnormal Pap smear of cervix   . Migraines    Past Surgical History:  Procedure Laterality Date  . CERVICAL BIOPSY  W/ LOOP ELECTRODE EXCISION  2002  . LAPAROSCOPIC BILATERAL SALPINGECTOMY Bilateral 09/12/2015   Procedure: LAPAROSCOPIC BILATERAL SALPINGECTOMY;  Surgeon: Emily Filbert, MD;  Location: [facility];  Service: Gynecology;  Laterality: Bilateral;  . LAPAROSCOPIC LYSIS OF ADHESIONS  09/12/2015   Procedure: LAPAROSCOPIC LYSIS OF ADHESIONS;  Surgeon: Emily Filbert, MD;  Location: [facility];  Service: Gynecology;;  . WISDOM TOOTH EXTRACTION     Social History  Substance Use Topics  . Smoking status: Never Smoker  . Smokeless tobacco: Never Used  . Alcohol use No   family history includes Cancer in her paternal grandfather and paternal grandmother; Diabetes in her maternal aunt; Hypertension in her maternal aunt and maternal grandfather.  ROS: negative except as noted in the HPI  Medications: Current Outpatient Prescriptions  Medication Sig Dispense Refill  . cyclobenzaprine (FLEXERIL) 5 MG tablet TAKE 1 TABLET BY MOUTH AT BEDTIME AS NEEDED FOR NECK PAIN 90 tablet 3  . fluticasone (FLONASE) 50 MCG/ACT nasal spray Place into both nostrils as needed for allergies or rhinitis.    . frovatriptan (FROVA) 2.5 MG tablet Take 1 tablet twice a day for 5 days around the time of your menstrual period 10 tablet 6  . gabapentin (NEURONTIN) 100 MG  capsule Take 1 capsule every night 30 capsule 6  . Multiple Vitamin (MULTIVITAMIN) capsule Take 1 capsule by mouth daily.    . nadolol (CORGARD) 20 MG tablet Take 3 tablets daily 270 tablet 3  . naproxen sodium (ALEVE) 220 MG tablet Take 220 mg by mouth daily as needed (for pain).    . Probiotic Product (PROBIOTIC PO) Take by mouth.    . SUMAtriptan (IMITREX) 100 MG tablet Take 100 mg by mouth at onset of headache. May repeat dose after 2 hours. Do not take more than 2/week. 30 tablet 3  . ofloxacin (FLOXIN) 0.3 % OTIC solution Place 10 drops into the left ear daily. For 7 days. 3.5 mL 0   No current facility-administered medications for this visit.    No Known Allergies     Objective:  BP 123/78   Pulse 75   Temp 97.7 F (36.5 C) (Oral)   Resp 16   Wt 209 lb (94.8 kg)   LMP 07/13/2017   BMI 33.73 kg/m  Gen: well-groomed, cooperative, not ill-appearing, no distress HEENT: normal conjunctiva, left external canal tender with purulent debris, left hearing diminished compared to right, right external canal clear and right TM pearly and transparent,  neck supple, trachea midline Pulm: Normal work of breathing, normal phonation Neuro: alert and oriented x 3, no tremor MSK: extremities atraumatic, normal gait and station Skin: intact, no rashes on exposed skin, no jaundice, no cyanosis   No results found for this or any previous visit (from the past 72 hour(s)). No results found.    Assessment and Plan: 36 y.o. female with   1. Acute swimmer's ear of left side - ofloxacin (FLOXIN) 0.3 % OTIC solution; Place 10 drops into the left ear daily. For 7 days.  Dispense: 3.5 mL; Refill: 0 - finish prednisone - tylenol/NSAIDs prn for pain  Patient education and anticipatory guidance given Patient agrees with treatment plan Follow-up as needed if symptoms worsen or fail to improve  Darlyne Russian PA-C

## 2017-07-22 ENCOUNTER — Encounter: Payer: Self-pay | Admitting: Family Medicine

## 2017-07-22 ENCOUNTER — Ambulatory Visit (INDEPENDENT_AMBULATORY_CARE_PROVIDER_SITE_OTHER): Payer: 59 | Admitting: Family Medicine

## 2017-07-22 VITALS — BP 121/76 | HR 90 | Temp 98.6°F | Wt 208.0 lb

## 2017-07-22 DIAGNOSIS — Z23 Encounter for immunization: Secondary | ICD-10-CM | POA: Diagnosis not present

## 2017-07-22 DIAGNOSIS — H6592 Unspecified nonsuppurative otitis media, left ear: Secondary | ICD-10-CM

## 2017-07-22 DIAGNOSIS — H60332 Swimmer's ear, left ear: Secondary | ICD-10-CM

## 2017-07-22 DIAGNOSIS — H9192 Unspecified hearing loss, left ear: Secondary | ICD-10-CM

## 2017-07-22 MED ORDER — AMOXICILLIN-POT CLAVULANATE 875-125 MG PO TABS: 1.0000 | ORAL_TABLET | Freq: Two times a day (BID) | ORAL | 0 refills | Status: DC

## 2017-07-22 NOTE — Progress Notes (Signed)
   Subjective:    Patient ID: Lisa Cannon, female    DOB: January 06, 1981, 36 y.o.   MRN: [id number]  HPI 36 yo female here today with left ear pain x 10 days.  She was actually seen in urgent care on August 13 and had 2 day history of bilateral ear pressure and fullness.at that time she had some erythema of the left tympanic membrane with poor landmarks. She was treated with amoxicillin and prednisone.she has had some discomfort but her biggest concern is that she's had decreased hearing. She actually followed back up in our office on August 17. Her daughter had splashed water and her irritable and she was worried about swimmer's ear. She was diagnosed with swimmer's ear and treated with ofloxacin drops which she has now been using for 5 days. Told to finish the prednisone.   Review of Systems     Objective:   Physical Exam  Constitutional: She is oriented to person, place, and time. She appears well-developed and well-nourished.  HENT:  Head: Normocephalic and atraumatic.  Right Ear: External ear normal.  Left Ear: External ear normal.  Nose: Nose normal.  Mouth/Throat: Oropharynx is clear and moist.  ight TM and canal is clear. Left canal appears to be clear though there is some yellow colored substance near the eardrum. I can't tell a technically puts in front of the eardrum or behind it's a rib and irrigate the ear she's not having any pain with tugging on the tragus.   Eyes: Pupils are equal, round, and reactive to light. Conjunctivae and EOM are normal.  Neck: Neck supple. No thyromegaly present.  Cardiovascular: Normal rate, regular rhythm and normal heart sounds.   Pulmonary/Chest: Effort normal and breath sounds normal. She has no wheezes.  Lymphadenopathy:    She has no cervical adenopathy.  Neurological: She is alert and oriented to person, place, and time.  Skin: Skin is warm and dry.  Psychiatric: She has a normal mood and affect.   After irrigating the ear the tympanic  membrane appears to be just pink but very thickened was still a little bit of white debris around the area.    Assessment & Plan:  Left otalgia - Most consistent with otitis media in addition to otitis externa. I will have her continue with the drops. Now that we've irrigated the area out I think the medication will get 2 more affected area. In addition to place her on Augmentin. Not significantly better by Friday them please let us know.  Flu vac given today

## 2017-07-22 NOTE — Patient Instructions (Signed)
Go ahead and start the Augmentin antibiotic. Make sure to complete the full course of 7 days. Finish out the drops. If you can get an additional 3-5 days that would be great.

## 2017-08-04 MED FILL — SUMATRIPTAN SUCC 100 MG TAB: 100 | 30 days supply | Qty: 9 | Fill #9

## 2017-08-04 MED FILL — CYCLOBENZAPRINE 5 MG TABLET: 5 | 90 days supply | Qty: 90 | Fill #2

## 2017-08-04 MED FILL — FROVATRIPTAN SUCC 2.5 MG TA: 2.5 | 30 days supply | Qty: 9 | Fill #4

## 2017-08-05 ENCOUNTER — Telehealth: Payer: Self-pay | Admitting: *Deleted

## 2017-08-05 DIAGNOSIS — H6592 Unspecified nonsuppurative otitis media, left ear: Secondary | ICD-10-CM

## 2017-08-05 NOTE — Telephone Encounter (Signed)
Spoke to pt she denies fever. I informed her that since she has been seen so many times for this that Dr. Madilyn Fireman is going to refer her to an ENT. She is agreeable to this plan. Referral placed.Audelia Hives [place]

## 2017-08-05 NOTE — Telephone Encounter (Signed)
Pt called and lvm stating that she is continuing to have drainage/fullness from her ear. She still has been having cold sxs. She reports that she finished all of the Abx and ear drops which did help but she is not feeling better now and would like to know what to do. Maryruth Eve, Lahoma Crocker

## 2017-08-07 DIAGNOSIS — H6122 Impacted cerumen, left ear: Secondary | ICD-10-CM | POA: Diagnosis not present

## 2017-08-07 DIAGNOSIS — H6062 Unspecified chronic otitis externa, left ear: Secondary | ICD-10-CM | POA: Diagnosis not present

## 2017-08-07 DIAGNOSIS — H6522 Chronic serous otitis media, left ear: Secondary | ICD-10-CM | POA: Diagnosis not present

## 2017-08-07 DIAGNOSIS — H6692 Otitis media, unspecified, left ear: Secondary | ICD-10-CM | POA: Diagnosis not present

## 2017-08-07 MED FILL — CIPRODEX OTIC SUSPENSION: 0.3-0.1 | 75 days supply | Qty: 8 | Fill #0

## 2017-08-07 MED FILL — CIPROFLOXACIN HCL 500 MG TA: 500 | 10 days supply | Qty: 20 | Fill #0

## 2017-08-14 DIAGNOSIS — H6062 Unspecified chronic otitis externa, left ear: Secondary | ICD-10-CM | POA: Diagnosis not present

## 2017-08-14 DIAGNOSIS — H6692 Otitis media, unspecified, left ear: Secondary | ICD-10-CM | POA: Diagnosis not present

## 2017-08-14 DIAGNOSIS — H6122 Impacted cerumen, left ear: Secondary | ICD-10-CM | POA: Diagnosis not present

## 2017-08-20 DIAGNOSIS — H6522 Chronic serous otitis media, left ear: Secondary | ICD-10-CM | POA: Diagnosis not present

## 2017-08-20 DIAGNOSIS — H6062 Unspecified chronic otitis externa, left ear: Secondary | ICD-10-CM | POA: Diagnosis not present

## 2017-08-28 MED FILL — FROVATRIPTAN SUCC 2.5 MG TA: 2.5 | 30 days supply | Qty: 9 | Fill #5

## 2017-09-02 MED FILL — SUMATRIPTAN SUCC 100 MG TAB: 100 | 30 days supply | Qty: 9 | Fill #10

## 2017-09-19 MED FILL — GABAPENTIN 100 MG CAPS: 100 | 30 days supply | Qty: 30 | Fill #1

## 2017-09-22 MED FILL — AMOX TR-K CLV 875-125 MG TA: 875-125 | 7 days supply | Qty: 14 | Fill #0

## 2017-09-22 MED FILL — FROVATRIPTAN SUCC 2.5 MG TA: 2.5 | 30 days supply | Qty: 9 | Fill #6

## 2017-09-30 DIAGNOSIS — H6122 Impacted cerumen, left ear: Secondary | ICD-10-CM | POA: Diagnosis not present

## 2017-09-30 DIAGNOSIS — H6692 Otitis media, unspecified, left ear: Secondary | ICD-10-CM | POA: Diagnosis not present

## 2017-09-30 DIAGNOSIS — H6522 Chronic serous otitis media, left ear: Secondary | ICD-10-CM | POA: Diagnosis not present

## 2017-09-30 DIAGNOSIS — H6062 Unspecified chronic otitis externa, left ear: Secondary | ICD-10-CM | POA: Diagnosis not present

## 2017-10-01 ENCOUNTER — Other Ambulatory Visit: Payer: Self-pay | Admitting: Neurology

## 2017-10-01 DIAGNOSIS — G43009 Migraine without aura, not intractable, without status migrainosus: Secondary | ICD-10-CM

## 2017-10-01 MED FILL — SUMATRIPTAN SUCC 100 MG TAB: 100 | 30 days supply | Qty: 30 | Fill #0

## 2017-10-16 ENCOUNTER — Other Ambulatory Visit: Payer: Self-pay | Admitting: Neurology

## 2017-10-16 DIAGNOSIS — G43009 Migraine without aura, not intractable, without status migrainosus: Secondary | ICD-10-CM

## 2017-10-16 MED FILL — GABAPENTIN 100 MG CAPS: 100 | 30 days supply | Qty: 30 | Fill #2

## 2017-10-16 MED FILL — NADOLOL 20 MG TAB: 20 | 90 days supply | Qty: 270 | Fill #0

## 2017-10-16 MED FILL — FROVATRIPTAN SUCC 2.5 MG TA: 2.5 | 27 days supply | Qty: 9 | Fill #0

## 2017-11-05 ENCOUNTER — Other Ambulatory Visit: Payer: Self-pay | Admitting: Neurology

## 2017-11-05 DIAGNOSIS — M542 Cervicalgia: Secondary | ICD-10-CM

## 2017-11-05 MED FILL — CYCLOBENZAPRINE 5 MG TABLET: 5 | 90 days supply | Qty: 90 | Fill #0

## 2017-11-11 ENCOUNTER — Ambulatory Visit: Payer: 59 | Admitting: Neurology

## 2017-11-12 MED FILL — FROVATRIPTAN SUCC 2.5 MG TA: 2.5 | 27 days supply | Qty: 9 | Fill #1

## 2017-11-16 MED FILL — GABAPENTIN 100 MG CAPS: 100 | 30 days supply | Qty: 30 | Fill #3

## 2017-12-10 ENCOUNTER — Encounter: Payer: Self-pay | Admitting: Obstetrics and Gynecology

## 2017-12-10 ENCOUNTER — Ambulatory Visit (INDEPENDENT_AMBULATORY_CARE_PROVIDER_SITE_OTHER): Payer: 59 | Admitting: Obstetrics and Gynecology

## 2017-12-10 VITALS — BP 132/84 | HR 80 | Ht 66.0 in | Wt 211.0 lb

## 2017-12-10 DIAGNOSIS — Z124 Encounter for screening for malignant neoplasm of cervix: Secondary | ICD-10-CM | POA: Diagnosis not present

## 2017-12-10 DIAGNOSIS — Z1151 Encounter for screening for human papillomavirus (HPV): Secondary | ICD-10-CM | POA: Diagnosis not present

## 2017-12-10 DIAGNOSIS — Z01419 Encounter for gynecological examination (general) (routine) without abnormal findings: Secondary | ICD-10-CM

## 2017-12-10 NOTE — Progress Notes (Signed)
Subjective:     Lisa Cannon is a 37 y.o. female G1P1 with BMI 34 who is here for a comprehensive physical exam. The patient reports no problems. Patient reports a monthly 5-day period. She is sexually active using BTL for contraception. She denies pelvic pain or abnormal discharge. She reports some occasional leakage of urine with a forceful cough.  Past Medical History:  Diagnosis Date  . Abnormal Pap smear of cervix   . Migraines    Past Surgical History:  Procedure Laterality Date  . CERVICAL BIOPSY  W/ LOOP ELECTRODE EXCISION  2002  . LAPAROSCOPIC BILATERAL SALPINGECTOMY Bilateral 09/12/2015   Procedure: LAPAROSCOPIC BILATERAL SALPINGECTOMY;  Surgeon: Emily Filbert, MD;  Location: [facility];  Service: Gynecology;  Laterality: Bilateral;  . LAPAROSCOPIC LYSIS OF ADHESIONS  09/12/2015   Procedure: LAPAROSCOPIC LYSIS OF ADHESIONS;  Surgeon: Emily Filbert, MD;  Location: [facility];  Service: Gynecology;;  . TUBAL LIGATION    . WISDOM TOOTH EXTRACTION     Family History  Problem Relation Age of Onset  . Hypertension Maternal Grandfather   . Cancer Paternal Grandfather        lung cancer  . Cancer Paternal Grandmother        brain  . Diabetes Maternal Aunt   . Hypertension Maternal Aunt      Social History   Socioeconomic History  . Marital status: Married    Spouse name: Not on file  . Number of children: Not on file  . Years of education: Not on file  . Highest education level: Not on file  Social Needs  . Financial resource strain: Not on file  . Food insecurity - worry: Not on file  . Food insecurity - inability: Not on file  . Transportation needs - medical: Not on file  . Transportation needs - non-medical: Not on file  Occupational History  . Occupation: homemaker  Tobacco Use  . Smoking status: Never Smoker  . Smokeless tobacco: Never Used  Substance and Sexual Activity  . Alcohol use: No    Alcohol/week: 0.0 oz  . Drug use: No  . Sexual activity: Yes   Partners: Male    Birth control/protection: Surgical  Other Topics Concern  . Not on file  Social History Narrative  . Not on file   Health Maintenance  Topic Date Due  . PAP SMEAR  09/23/2017  . TETANUS/TDAP  03/20/2022  . INFLUENZA VACCINE  Completed  . HIV Screening  Completed       Review of Systems Pertinent items are noted in HPI.   Objective:  Blood pressure 132/84, pulse 80, height 5\' 6"  (1.676 m), weight 211 lb (95.7 kg), last menstrual period 11/22/2017.     GENERAL: Well-developed, well-nourished female in no acute distress.  HEENT: Normocephalic, atraumatic. Sclerae anicteric.  NECK: Supple. Normal thyroid.  LUNGS: Clear to auscultation bilaterally.  HEART: Regular rate and rhythm. BREASTS: Symmetric in size. No palpable masses or lymphadenopathy, skin changes, or nipple drainage. ABDOMEN: Soft, nontender, nondistended. No organomegaly. PELVIC: Normal external female genitalia. Vagina is pink and rugated.  Normal discharge. Normal appearing cervix. Uterus is normal in size. No adnexal mass or tenderness. EXTREMITIES: No cyanosis, clubbing, or edema, 2+ distal pulses.    Assessment:    Healthy female exam.      Plan:    Pap smear collected Patient advised to perform monthly self breast exam Patient with elevated HgA1c in 2017, she declined repeat testing as she states she is  working on losing weight Patient will be contacted with any abnormal results RTC prn See After Visit Summary for Counseling Recommendations

## 2017-12-11 ENCOUNTER — Ambulatory Visit (INDEPENDENT_AMBULATORY_CARE_PROVIDER_SITE_OTHER): Payer: 59 | Admitting: Neurology

## 2017-12-11 ENCOUNTER — Encounter: Payer: Self-pay | Admitting: Neurology

## 2017-12-11 VITALS — BP 114/72 | HR 82 | Ht 66.0 in | Wt 214.0 lb

## 2017-12-11 DIAGNOSIS — M542 Cervicalgia: Secondary | ICD-10-CM

## 2017-12-11 DIAGNOSIS — G43009 Migraine without aura, not intractable, without status migrainosus: Secondary | ICD-10-CM

## 2017-12-11 MED ORDER — FROVATRIPTAN SUCCINATE 2.5 MG PO TABS: ORAL_TABLET | ORAL | 6 refills | Status: DC

## 2017-12-11 MED ORDER — CYCLOBENZAPRINE HCL 5 MG PO TABS: ORAL_TABLET | ORAL | 3 refills | Status: DC

## 2017-12-11 MED ORDER — NADOLOL 20 MG PO TABS: ORAL_TABLET | ORAL | 3 refills | Status: DC

## 2017-12-11 MED ORDER — SUMATRIPTAN SUCCINATE 100 MG PO TABS: ORAL_TABLET | ORAL | 3 refills | Status: DC

## 2017-12-11 MED ORDER — GABAPENTIN 100 MG PO CAPS: ORAL_CAPSULE | ORAL | 6 refills | Status: DC

## 2017-12-11 MED FILL — FROVATRIPTAN SUCC 2.5 MG TA: 2.5 | 27 days supply | Qty: 9 | Fill #2

## 2017-12-11 MED FILL — GABAPENTIN 100 MG CAPS: 100 | 30 days supply | Qty: 90 | Fill #0

## 2017-12-11 NOTE — Progress Notes (Signed)
NEUROLOGY FOLLOW UP OFFICE NOTE  Lisa Cannon [id number]  DOB: Feb 21, 1981  HISTORY OF PRESENT ILLNESS: I had the pleasure of seeing Lisa Cannon in follow-up in the neurology clinic on 12/11/2017.  The patient was last seen 5 months ago for worsening migraines. On her last visit, she continued to report 8 migraines a month, clustering during and after her menstrual period. She takes Frova as preventative around the time of her period, which does help more before the onset of her period, but otherwise she has Imitrex for rescue. She is taking Nadolol 60mg  daily for migraine prevention, low dose gabapentin 100mg  qhs was added on her last visit. She denies any side effects on it. She feels that her migraines are not as painful, but still has the same frequency 7-8 a month, more around the time of her menstrual period. Taking the Frova perimenstrually does help. She reports September was a bad month with back to back migraines with nausea/vomiting, but overall migraine frequency is at baseline. She denies any vision changes, dizziness, focal numbness/tingling/weakness, no falls.   HPI: This is a very pleasant 37 yo RH woman with a history of migraines since her pre-teen years. This increased in frequency and severity when she was in college, with several migraines every week. Headaches are usually over the left frontal and retroorbital region, at times radiating to the left side of her neck. This is associated with nausea, dizziness, tightening in her jaw area, and mild photo/phonophobia. No migraine auras. She was initially started on Topamax which caused more headaches and nausea. Butalbital does not help. She was switched to Corgard (Nadolol) 5 years ago, which significantly helped reduce headaches to once a month, usually a day before she would start her menstrual period. She stopped the medication when she became pregnant with her daughter. Since then, she would have 1 migraine/month on  average, with good response to Imitrex.Migraine triggers include lack of sleep and allergies. Her father and possibly paternal grandmother had migraines.   Prior Preventatives: Topamax, Zonisamide Prior rescue medications: Butaltital  PAST MEDICAL HISTORY: Past Medical History:  Diagnosis Date  . Abnormal Pap smear of cervix   . Migraines     MEDICATIONS: Current Outpatient Medications on File Prior to Visit  Medication Sig Dispense Refill  . amoxicillin-clavulanate (AUGMENTIN) 875-125 MG tablet Take 1 tablet by mouth 2 (two) times daily. 14 tablet 0  . cyclobenzaprine (FLEXERIL) 5 MG tablet TAKE 1 TABLET BY MOUTH AT BEDTIME AS NEEDED FOR NECK PAIN 90 tablet 3  . fluticasone (FLONASE) 50 MCG/ACT nasal spray Place into both nostrils as needed for allergies or rhinitis.    . frovatriptan (FROVA) 2.5 MG tablet TAKE 1 TABLET BY MOUTH TWICE DAILY FOR 5 DAYS AROUND THE TIME OF YOUR MENSTRUAL PERIOD 10 tablet 6  . gabapentin (NEURONTIN) 100 MG capsule Take 1 capsule every night 30 capsule 6  . Multiple Vitamin (MULTIVITAMIN) capsule Take 1 capsule by mouth daily.    . nadolol (CORGARD) 20 MG tablet TAKE 3 TABLETS BY MOUTH DAILY 270 tablet 3  . naproxen sodium (ALEVE) 220 MG tablet Take 220 mg by mouth daily as needed (for pain).    . Probiotic Product (PROBIOTIC PO) Take by mouth.    . SUMAtriptan (IMITREX) 100 MG tablet TAKE 1 TABLET BY MOUTH AT ONSET OF HEADACHE. MAY REPEAT DOSE AFTER 2 HOURS. DO NOT TAKE MORE THAN 2 DAYS A WEEK. 30 tablet 3   No current facility-administered medications on file prior to  visit.     ALLERGIES: No Known Allergies  FAMILY HISTORY: Family History  Problem Relation Age of Onset  . Hypertension Maternal Grandfather   . Cancer Paternal Grandfather        lung cancer  . Cancer Paternal Grandmother        brain  . Diabetes Maternal Aunt   . Hypertension Maternal Aunt     SOCIAL HISTORY: Social History   Socioeconomic History  . Marital status:  Married    Spouse name: Not on file  . Number of children: Not on file  . Years of education: Not on file  . Highest education level: Not on file  Social Needs  . Financial resource strain: Not on file  . Food insecurity - worry: Not on file  . Food insecurity - inability: Not on file  . Transportation needs - medical: Not on file  . Transportation needs - non-medical: Not on file  Occupational History  . Occupation: homemaker  Tobacco Use  . Smoking status: Never Smoker  . Smokeless tobacco: Never Used  Substance and Sexual Activity  . Alcohol use: No    Alcohol/week: 0.0 oz  . Drug use: No  . Sexual activity: Yes    Partners: Male    Birth control/protection: Surgical  Other Topics Concern  . Not on file  Social History Narrative  . Not on file    REVIEW OF SYSTEMS: Constitutional: No fevers, chills, or sweats, no generalized fatigue, change in appetite Eyes: No visual changes, double vision, eye pain Ear, nose and throat: No hearing loss, ear pain, nasal congestion, sore throat Cardiovascular: No chest pain, palpitations Respiratory:  No shortness of breath at rest or with exertion, wheezes GastrointestinaI: No nausea, vomiting, diarrhea, abdominal pain, fecal incontinence Genitourinary:  No dysuria, urinary retention or frequency Musculoskeletal:  + neck pain, no back pain Integumentary: No rash, pruritus, skin lesions Neurological: as above Psychiatric: No depression, insomnia, anxiety Endocrine: No palpitations, fatigue, diaphoresis, mood swings, change in appetite, change in weight, increased thirst Hematologic/Lymphatic:  No anemia, purpura, petechiae. Allergic/Immunologic: no itchy/runny eyes, nasal congestion, recent allergic reactions, rashes  PHYSICAL EXAM: Vitals:   12/11/17 1017  BP: 114/72  Pulse: 82  SpO2: 97%   General: No acute distress Head:  Normocephalic/atraumatic Neck: supple, no paraspinal tenderness, full range of motion Heart:  Regular  rate and rhythm Lungs:  Clear to auscultation bilaterally Back: No paraspinal tenderness Skin/Extremities: No rash, no edema Neurological Exam: alert and oriented to person, place, and time. No aphasia or dysarthria. Fund of knowledge is appropriate.  Recent and remote memory are intact.  Attention and concentration are normal.    Able to name objects and repeat phrases. Cranial nerves: Pupils equal, round, reactive to light.  Fundoscopic exam unremarkable, no papilledema. Extraocular movements intact with no nystagmus. Visual fields full. Facial sensation intact. No facial asymmetry. Tongue, uvula, palate midline.  Motor: Bulk and tone normal, muscle strength 5/5 throughout with no pronator drift.  Sensation to light touch intact.  No extinction to double simultaneous stimulation.  Deep tendon reflexes 2+ throughout, toes downgoing.  Finger to nose testing intact.  Gait narrow-based and steady, able to tandem walk adequately.  Romberg negative.  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 Headaches have improved with discontinuation of OCPs, however she continues have 8 migraines a month, worse around the time of her menstrual period. Oral contraceptives made her feel worse, she  tried tricycling in the past. She has noticed an improvement with taking a regimen of Frova 2.5mg  BID x 5 days during her menstrual period, but continues to have 7-8 migraines a month, mostly around her period. She is taking Nadolol 60mg  daily, and will increase gabapentin to 200mg  qhs. Continue migraine calendar for 3 months, if no change, increase gabapentin to 300mg  qhs. Side effects were discussed. Continue prn Imitrex and Flexeril for neck pain. She now has more time and can do PT for neck pain to hopefully help with headaches as well. She knows to minimize rescue medications to 2-3 a week to avoid rebound headaches. She will continue headache calendar and  follow-up in 5-6 months.   Thank you for allowing me to participate in her care.  Please do not hesitate to call for any questions or concerns.  The duration of this appointment visit was 25 minutes of face-to-face time with the patient.  Greater than 50% of this time was spent in counseling, explanation of diagnosis, planning of further management, and coordination of care.   Ellouise Newer, M.D.   CC: Dr. Madilyn Fireman

## 2017-12-11 NOTE — Patient Instructions (Addendum)
1. Increase gabapentin 100mg : take 2 caps at night. Continue migraine calendar, if no difference after 3 months, increase to 3 caps at night.  2. Continue Nadolol 60mg  daily 3. Continue as needed Flexeril and Imitrex, as well as Frova around the time of your period 4. We will send a referral to PT for neck pain to hopefully help with headaches as well  A referral has been sent to [facility] located on [address] - they will contact you to schedule.   5. Follow-up in 5-6 months, call for any changes

## 2017-12-16 LAB — CYTOLOGY - PAP
DIAGNOSIS: NEGATIVE
HPV 16/18/45 genotyping: POSITIVE — AB
HPV: DETECTED — AB

## 2017-12-22 DIAGNOSIS — H6061 Unspecified chronic otitis externa, right ear: Secondary | ICD-10-CM | POA: Diagnosis not present

## 2017-12-22 DIAGNOSIS — H6121 Impacted cerumen, right ear: Secondary | ICD-10-CM | POA: Diagnosis not present

## 2017-12-23 ENCOUNTER — Encounter: Payer: Self-pay | Admitting: Physical Therapy

## 2017-12-23 ENCOUNTER — Ambulatory Visit: Payer: 59 | Attending: Family Medicine | Admitting: Physical Therapy

## 2017-12-23 ENCOUNTER — Other Ambulatory Visit: Payer: Self-pay

## 2017-12-23 DIAGNOSIS — R293 Abnormal posture: Secondary | ICD-10-CM | POA: Insufficient documentation

## 2017-12-23 DIAGNOSIS — M25511 Pain in right shoulder: Secondary | ICD-10-CM | POA: Insufficient documentation

## 2017-12-23 DIAGNOSIS — G8929 Other chronic pain: Secondary | ICD-10-CM | POA: Insufficient documentation

## 2017-12-23 DIAGNOSIS — M6281 Muscle weakness (generalized): Secondary | ICD-10-CM | POA: Insufficient documentation

## 2017-12-23 DIAGNOSIS — M542 Cervicalgia: Secondary | ICD-10-CM | POA: Insufficient documentation

## 2017-12-23 DIAGNOSIS — M25512 Pain in left shoulder: Secondary | ICD-10-CM | POA: Diagnosis not present

## 2017-12-23 NOTE — Therapy (Signed)
[place], [place], [address] Phone: [phone]   Fax:  [phone]  Physical Therapy Evaluation  Patient Details  Name: Lisa Cannon MRN: [id number] Date of Birth: May 02, 1981 Referring Provider: Cameron Sprang, MD   Encounter Date: 12/23/2017  PT End of Session - 12/23/17 0922    Visit Number  1    Number of Visits  9    Date for PT Re-Evaluation  01/23/18    Authorization Type  [facility] [employer]    PT Start Time  0922    PT Stop Time  1000    PT Time Calculation (min)  38 min    Activity Tolerance  Patient tolerated treatment well    Behavior During Therapy  [facility] for tasks assessed/performed       Past Medical History:  Diagnosis Date  . Abnormal Pap smear of cervix   . Migraines     Past Surgical History:  Procedure Laterality Date  . CERVICAL BIOPSY  W/ LOOP ELECTRODE EXCISION  2002  . LAPAROSCOPIC BILATERAL SALPINGECTOMY Bilateral 09/12/2015   Procedure: LAPAROSCOPIC BILATERAL SALPINGECTOMY;  Surgeon: Emily Filbert, MD;  Location: [facility];  Service: Gynecology;  Laterality: Bilateral;  . LAPAROSCOPIC LYSIS OF ADHESIONS  09/12/2015   Procedure: LAPAROSCOPIC LYSIS OF ADHESIONS;  Surgeon: Emily Filbert, MD;  Location: [facility];  Service: Gynecology;;  . TUBAL LIGATION    . WISDOM TOOTH EXTRACTION      There were no vitals filed for this visit.   Subjective Assessment - 12/23/17 0928    Subjective  Neck pretty much hurts every day, flexeril at night to ease pain in L side muscle at the base of my skull. a lot of neck/shoulder tension. Turning head doesnt necessarily hurt my neck, it hurts my head. Migraines mainly on Lt side and pressure in eyes. Migraines since about 37 yo, neck pain about 10 yr ago. 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     How long can you sit comfortably?  gets tight if she sits for long periods on the couch    Patient Stated Goals  reduce pain    Currently in Pain?  Yes    Pain Score   3     Pain Location  Neck    Pain Orientation  Right;Left;Mid    Pain Descriptors / Indicators  -- tense    Pain Radiating Towards  bilat shoulders    Aggravating Factors   cervical rotation    Pain Relieving Factors  flexeril         [facility] PT Assessment - 12/23/17 0001      Assessment   Medical Diagnosis  neck pain, migraine without aura    Referring Provider  Cameron Sprang, MD    Hand Dominance  Right    Next MD Visit  -- 3 months    Prior Therapy  no      Precautions   Precautions  None      Restrictions   Weight Bearing Restrictions  No      Balance Screen   Has the patient fallen in the past 6 months  No      Prior Function   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      Cognition   Overall Cognitive Status  Within Functional Limits for tasks assessed      Observation/Other Assessments   Focus on Therapeutic Outcomes (FOTO)   21% limitation  Sensation   Additional Comments  [facility]      Posture/Postural Control   Posture Comments  flat thoracic spine with lower cervical kyphosis, rounded shoulders, forward head      ROM / Strength   AROM / PROM / Strength  AROM;Strength      Strength   Overall Strength Comments  bilat GHJ ER 4/5      Palpation   Palpation comment  gross feeling of tension to palpation, limited cervical mobility             Objective measurements completed on examination: See above findings.      [facility] PT Treatment/Exercise - 12/23/17 0001      Exercises   Exercises  Neck      Neck Exercises: Seated   Other Seated Exercise  scapular retraction      Neck Exercises: Supine   Neck Retraction Limitations  retraction into pillow aligns midline with retraction      Neck Exercises: Stretches   Other Neck Stretches  door pec stretch             PT Education - 12/23/17 1009    Education provided  Yes    Education Details  anatomy of condition, POC, HEP, exercise & manual form/rationale, sleeping posture, TPDN     Person(s) Educated  Patient    Methods  Explanation;Demonstration;Tactile cues;Verbal cues;Handout    Comprehension  Verbalized understanding;Need further instruction;Returned demonstration;Verbal cues required;Tactile cues required          PT Long Term Goals - 12/23/17 1010      PT LONG TERM GOAL #1   Title  Pt will be able to sleep without being woken by neck pain    Baseline  cannot go a night withou neck pain at eval    Time  4    Period  Weeks    Status  New    Target Date  01/23/18      PT LONG TERM GOAL #2   Title  GHJ ER to 5/5 bilat for improved postural support    Baseline  4/5 at eval    Time  4    Period  Weeks    Status  New    Target Date  01/23/18      PT LONG TERM GOAL #3   Title  Pt will be able to perform cervical rotation without increased pain    Baseline  pain in head with rotation at eval    Time  4    Period  Weeks    Status  New    Target Date  01/23/18      PT LONG TERM GOAL #4   Title  FOTO to 19% limitation to indicate significant improvement in functional ability    Baseline  21% limitation at eval    Time  4    Period  Weeks    Status  New    Target Date  01/23/18             Plan - 12/23/17 1004    Clinical Impression Statement  Pt presents to PT with complaints of chronic neck pain and migraines. 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 notable flattening of throacic spine with kyposis at lower c-spine resulting in forward head and extension through upper cervical region. Resting in slight cervical Rt sidebend in supine. Full cervical ROM noted bilat. Poor activation of bilat GHJ external rotators  and scapular retractors. Pt will benefit from skilled PT in order to improve periscapular activation and support as well as train lifting and functional activities to improve activation along biomechanical chain.     History and Personal Factors relevant to plan of care:  chronic migraines    Clinical  Presentation  Stable    Clinical Decision Making  Low    Rehab Potential  Good    PT Frequency  2x / week    PT Duration  4 weeks    PT Treatment/Interventions  ADLs/Self Care Home Management;Cryotherapy;Electrical Stimulation;Ultrasound;Traction;Moist Heat;Therapeutic activities;Therapeutic exercise;Patient/family education;Neuromuscular re-education;Manual techniques;Passive range of motion;Taping;Dry needling    PT Next Visit Plan  DN as appropriate suboccipitals & upper traps, prone periscapular exercises, deep neck flexor strengthening    PT Home Exercise Plan  scapular retraction, chin tucks, door pec stretch, reduce stomach sleeping    Consulted and Agree with Plan of Care  Patient       Patient will benefit from skilled therapeutic intervention in order to improve the following deficits and impairments:  Improper body mechanics, Pain, Postural dysfunction, Increased muscle spasms, Decreased activity tolerance, Decreased strength  Visit Diagnosis: Cervicalgia - Plan: PT plan of care cert/re-cert  Chronic right shoulder pain - Plan: PT plan of care cert/re-cert  Chronic left shoulder pain - Plan: PT plan of care cert/re-cert  Muscle weakness (generalized) - Plan: PT plan of care cert/re-cert  Abnormal posture - Plan: PT plan of care cert/re-cert     Problem List Patient Active Problem List   Diagnosis Date Noted  . Migraine without aura and without status migrainosus, not intractable 03/07/2015  . Neck pain 03/07/2015  . WEIGHT GAIN 04/06/2010  . GASTROESOPHAGEAL REFLUX DISEASE, MILD, HX OF 04/06/2010  . BENIGN POSITIONAL VERTIGO 11/21/2009  . ALLERGIC RHINITIS 10/12/2009  . COMMON MIGRAINE 05/06/2008    Pricilla Moehle C. Charli Halle PT, DPT 12/23/17 10:35 AM   [facility] [address] [place], [place], [address] Phone: [phone]   Fax:  [phone]  Name: Lisa Cannon MRN: [id number] Date of Birth: 04/08/1981

## 2017-12-31 ENCOUNTER — Ambulatory Visit: Payer: 59 | Admitting: Physical Therapy

## 2017-12-31 ENCOUNTER — Encounter: Payer: Self-pay | Admitting: Physical Therapy

## 2017-12-31 DIAGNOSIS — M6281 Muscle weakness (generalized): Secondary | ICD-10-CM | POA: Diagnosis not present

## 2017-12-31 DIAGNOSIS — M542 Cervicalgia: Secondary | ICD-10-CM | POA: Diagnosis not present

## 2017-12-31 DIAGNOSIS — M25512 Pain in left shoulder: Secondary | ICD-10-CM | POA: Diagnosis not present

## 2017-12-31 DIAGNOSIS — R293 Abnormal posture: Secondary | ICD-10-CM | POA: Diagnosis not present

## 2017-12-31 DIAGNOSIS — G8929 Other chronic pain: Secondary | ICD-10-CM

## 2017-12-31 DIAGNOSIS — M25511 Pain in right shoulder: Secondary | ICD-10-CM

## 2017-12-31 NOTE — Therapy (Signed)
[place], [place], [address] Phone: [phone]   Fax:  [phone]  Physical Therapy Treatment  Patient Details  Name: Lisa Cannon MRN: [id number] Date of Birth: 11/18/1981 Referring Provider: Cameron Sprang, MD   Encounter Date: 12/31/2017  PT End of Session - 12/31/17 1015    Visit Number  2    Number of Visits  9    Date for PT Re-Evaluation  01/23/18    Authorization Type  [employer]    PT Start Time  1016    PT Stop Time  1055    PT Time Calculation (min)  39 min    Activity Tolerance  Patient tolerated treatment well    Behavior During Therapy  [facility] for tasks assessed/performed       Past Medical History:  Diagnosis Date  . Abnormal Pap smear of cervix   . Migraines     Past Surgical History:  Procedure Laterality Date  . CERVICAL BIOPSY  W/ LOOP ELECTRODE EXCISION  2002  . LAPAROSCOPIC BILATERAL SALPINGECTOMY Bilateral 09/12/2015   Procedure: LAPAROSCOPIC BILATERAL SALPINGECTOMY;  Surgeon: Emily Filbert, MD;  Location: [place] [facility];  Service: Gynecology;  Laterality: Bilateral;  . LAPAROSCOPIC LYSIS OF ADHESIONS  09/12/2015   Procedure: LAPAROSCOPIC LYSIS OF ADHESIONS;  Surgeon: Emily Filbert, MD;  Location: [place] [facility];  Service: Gynecology;;  . TUBAL LIGATION    . WISDOM TOOTH EXTRACTION      There were no vitals filed for this visit.  Subjective Assessment - 12/31/17 1016    Subjective  Neck feels a little stiff today but not too much  pain. I can feel exercises ease the stiffness a little. No change in HA pain    Patient Stated Goals  reduce pain    Currently in Pain?  Yes    Pain Score  4     Pain Location  Neck    Pain Orientation  Right;Left    Pain Descriptors / Indicators  -- stiff                      [facility] Adult PT Treatment/Exercise - 12/31/17 0001      Neck Exercises: Machines for Strengthening   UBE (Upper Arm Bike)  retro 3 min L1      Neck Exercises: Prone   W Back  15 reps    Shoulder Extension  15 reps    Other Prone Exercise  scapular retraction      Manual Therapy   Manual Therapy  Joint mobilization;Soft tissue mobilization    Manual therapy comments  skilled palpation and monitoring during TPDN    Joint Mobilization  upper thoracic PA, bilat first rib depression    Soft tissue mobilization  bilat upper traps      Neck Exercises: Stretches   Upper Trapezius Stretch  Right;Left;30 seconds    Levator Stretch  Right;Left;30 seconds       Trigger Point Dry Needling - 12/31/17 1036    Consent Given?  Yes    Education Handout Provided  -- verbal education    Muscles Treated Upper Body  Upper trapezius bilateral           PT Education - 12/31/17 1242    Education provided  Yes    Education Details  TPDN & expected outcomes, exercise form/rationale    Person(s) Educated  Patient    Methods  Explanation;Demonstration;Tactile cues;Verbal cues;Handout    Comprehension  Verbalized understanding;Need further instruction;Returned demonstration;Verbal cues required;Tactile cues required          PT Long Term Goals - 12/23/17 1010      PT LONG TERM GOAL #1   Title  Pt will be able to sleep without being woken by neck pain    Baseline  cannot go a night withou neck pain at eval    Time  4    Period  Weeks    Status  New    Target Date  01/23/18      PT LONG TERM GOAL #2   Title  GHJ ER to 5/5 bilat for improved postural support    Baseline  4/5 at eval    Time  4    Period  Weeks    Status  New    Target Date  01/23/18      PT LONG TERM GOAL #3   Title  Pt will be able to perform cervical rotation without increased pain    Baseline  pain in head with rotation at eval    Time  4    Period  Weeks    Status  New    Target Date  01/23/18      PT LONG TERM GOAL #4   Title  FOTO to 19% limitation to indicate significant improvement in functional ability    Baseline  21% limitation at eval    Time  4    Period  Weeks     Status  New    Target Date  01/23/18            Plan - 12/31/17 1056    Clinical Impression Statement  DN to bilat upper traps resulting in multiple twitches and palpable lengthening. pt able to demo cervical rotation without extension, reporting soreness following as expected. Exercises to activate periscapular region to reduce overuse of upper traps.     PT Treatment/Interventions  ADLs/Self Care Home Management;Cryotherapy;Electrical Stimulation;Ultrasound;Traction;Moist Heat;Therapeutic activities;Therapeutic exercise;Patient/family education;Neuromuscular re-education;Manual techniques;Passive range of motion;Taping;Dry needling    PT Next Visit Plan  outcome of DN? deep neck flexor, standing bands    PT Home Exercise Plan  scapular retraction, chin tucks, door pec stretch, reduce stomach sleeping; prone retraction, extension, Ws; upper trap & levator stretch    Consulted and Agree with Plan of Care  Patient       Patient will benefit from skilled therapeutic intervention in order to improve the following deficits and impairments:  Improper body mechanics, Pain, Postural dysfunction, Increased muscle spasms, Decreased activity tolerance, Decreased strength  Visit Diagnosis: Cervicalgia  Chronic right shoulder pain  Chronic left shoulder pain  Muscle weakness (generalized)  Abnormal posture     Problem List Patient Active Problem List   Diagnosis Date Noted  . Migraine without aura and without status migrainosus, not intractable 03/07/2015  . Neck pain 03/07/2015  . WEIGHT GAIN 04/06/2010  . GASTROESOPHAGEAL REFLUX DISEASE, MILD, HX OF 04/06/2010  . BENIGN POSITIONAL VERTIGO 11/21/2009  . ALLERGIC RHINITIS 10/12/2009  . COMMON MIGRAINE 05/06/2008    Lisa Cannon Lisa Cannon PT, DPT 12/31/17 12:42 PM   [facility] [address] [place], [place], [address] Phone: [phone]   Fax:  [phone]  Name: Lisa Cannon MRN: [id number] Date of Birth: 03-Jul-1981

## 2018-01-05 DIAGNOSIS — H6122 Impacted cerumen, left ear: Secondary | ICD-10-CM | POA: Diagnosis not present

## 2018-01-05 DIAGNOSIS — H6062 Unspecified chronic otitis externa, left ear: Secondary | ICD-10-CM | POA: Diagnosis not present

## 2018-01-05 MED FILL — FROVATRIPTAN SUCC 2.5 MG TA: 2.5 | 27 days supply | Qty: 9 | Fill #3

## 2018-01-05 MED FILL — SUMATRIPTAN SUCC 100 MG TAB: 100 | 30 days supply | Qty: 8 | Fill #1

## 2018-01-06 ENCOUNTER — Ambulatory Visit: Payer: 59 | Attending: Family Medicine | Admitting: Physical Therapy

## 2018-01-06 ENCOUNTER — Encounter: Payer: Self-pay | Admitting: Physical Therapy

## 2018-01-06 DIAGNOSIS — M6281 Muscle weakness (generalized): Secondary | ICD-10-CM | POA: Insufficient documentation

## 2018-01-06 DIAGNOSIS — M542 Cervicalgia: Secondary | ICD-10-CM | POA: Insufficient documentation

## 2018-01-06 DIAGNOSIS — R293 Abnormal posture: Secondary | ICD-10-CM | POA: Insufficient documentation

## 2018-01-06 DIAGNOSIS — M25512 Pain in left shoulder: Secondary | ICD-10-CM | POA: Insufficient documentation

## 2018-01-06 DIAGNOSIS — M25511 Pain in right shoulder: Secondary | ICD-10-CM | POA: Insufficient documentation

## 2018-01-06 DIAGNOSIS — G8929 Other chronic pain: Secondary | ICD-10-CM | POA: Insufficient documentation

## 2018-01-06 NOTE — Therapy (Signed)
[place], [place], [address] Phone: [phone]   Fax:  [phone]  Physical Therapy Treatment  Patient Details  Name: Lisa Cannon MRN: [id number] Date of Birth: Oct 09, 1981 Referring Provider: Cameron Sprang, MD   Encounter Date: 01/06/2018  PT End of Session - 01/06/18 1140    Visit Number  3    Number of Visits  9    Date for PT Re-Evaluation  01/23/18    Authorization Type  [facility] [employer]    PT Start Time  1140    PT Stop Time  1158 had to leave to get daughter from school    PT Time Calculation (min)  18 min    Activity Tolerance  Patient tolerated treatment well    Behavior During Therapy  [facility] for tasks assessed/performed       Past Medical History:  Diagnosis Date  . Abnormal Pap smear of cervix   . Migraines     Past Surgical History:  Procedure Laterality Date  . CERVICAL BIOPSY  W/ LOOP ELECTRODE EXCISION  2002  . LAPAROSCOPIC BILATERAL SALPINGECTOMY Bilateral 09/12/2015   Procedure: LAPAROSCOPIC BILATERAL SALPINGECTOMY;  Surgeon: Emily Filbert, MD;  Location: [place] [facility];  Service: Gynecology;  Laterality: Bilateral;  . LAPAROSCOPIC LYSIS OF ADHESIONS  09/12/2015   Procedure: LAPAROSCOPIC LYSIS OF ADHESIONS;  Surgeon: Emily Filbert, MD;  Location: [place] [facility];  Service: Gynecology;;  . TUBAL LIGATION    . WISDOM TOOTH EXTRACTION      There were no vitals filed for this visit.  Subjective Assessment - 01/06/18 1140    Subjective  Reports feeling tension along her entire neck.                       [facility] Adult PT Treatment/Exercise - 01/06/18 0001      Manual Therapy   Manual therapy comments  skilled palpation and monitoring during TPDN    Soft tissue mobilization  cervical paraspinals, suboccipitals       Trigger Point Dry Needling - 01/06/18 1203    Muscles Treated Upper Body  -- cervical paraspinals                PT Long Term Goals - 12/23/17 1010      PT LONG  TERM GOAL #1   Title  Pt will be able to sleep without being woken by neck pain    Baseline  cannot go a night withou neck pain at eval    Time  4    Period  Weeks    Status  New    Target Date  01/23/18      PT LONG TERM GOAL #2   Title  GHJ ER to 5/5 bilat for improved postural support    Baseline  4/5 at eval    Time  4    Period  Weeks    Status  New    Target Date  01/23/18      PT LONG TERM GOAL #3   Title  Pt will be able to perform cervical rotation without increased pain    Baseline  pain in head with rotation at eval    Time  4    Period  Weeks    Status  New    Target Date  01/23/18      PT LONG TERM GOAL #4   Title  FOTO to 19% limitation to indicate significant improvement in functional  ability    Baseline  21% limitation at eval    Time  4    Period  Weeks    Status  New    Target Date  01/23/18            Plan - 01/06/18 1201    Clinical Impression Statement  Pt only had time for a 15 min appointment today due to need to go pick up daughter. DN to bilateral cervical paraspinals with twitch response on Lt. Pt reported immediate decrease in tension and felt "much better"  Educated on chin tuck with cervical flexion to stretch.     PT Treatment/Interventions  ADLs/Self Care Home Management;Cryotherapy;Electrical Stimulation;Ultrasound;Traction;Moist Heat;Therapeutic activities;Therapeutic exercise;Patient/family education;Neuromuscular re-education;Manual techniques;Passive range of motion;Taping;Dry needling    PT Next Visit Plan  DN PRN, standing bands    PT Home Exercise Plan  scapular retraction, chin tucks, door pec stretch, reduce stomach sleeping; prone retraction, extension, Ws; upper trap & levator stretch    Consulted and Agree with Plan of Care  Patient       Patient will benefit from skilled therapeutic intervention in order to improve the following deficits and impairments:  Improper body mechanics, Pain, Postural dysfunction, Increased muscle  spasms, Decreased activity tolerance, Decreased strength  Visit Diagnosis: Cervicalgia  Chronic right shoulder pain  Chronic left shoulder pain  Muscle weakness (generalized)  Abnormal posture     Problem List Patient Active Problem List   Diagnosis Date Noted  . Migraine without aura and without status migrainosus, not intractable 03/07/2015  . Neck pain 03/07/2015  . WEIGHT GAIN 04/06/2010  . GASTROESOPHAGEAL REFLUX DISEASE, MILD, HX OF 04/06/2010  . BENIGN POSITIONAL VERTIGO 11/21/2009  . ALLERGIC RHINITIS 10/12/2009  . COMMON MIGRAINE 05/06/2008   Liah Morr C. Leasia Swann PT, DPT 01/06/18 12:04 PM   [place] [facility] [address] [place], [place], [address] Phone: [phone]   Fax:  [phone]  Name: SIDDHI DORNBUSH MRN: [id number] Date of Birth: 02/11/1981

## 2018-01-08 ENCOUNTER — Encounter: Payer: Self-pay | Admitting: Physical Therapy

## 2018-01-08 ENCOUNTER — Ambulatory Visit: Payer: 59 | Admitting: Physical Therapy

## 2018-01-08 DIAGNOSIS — G8929 Other chronic pain: Secondary | ICD-10-CM | POA: Diagnosis not present

## 2018-01-08 DIAGNOSIS — M542 Cervicalgia: Secondary | ICD-10-CM | POA: Diagnosis not present

## 2018-01-08 DIAGNOSIS — M6281 Muscle weakness (generalized): Secondary | ICD-10-CM

## 2018-01-08 DIAGNOSIS — R293 Abnormal posture: Secondary | ICD-10-CM | POA: Diagnosis not present

## 2018-01-08 DIAGNOSIS — M25511 Pain in right shoulder: Secondary | ICD-10-CM

## 2018-01-08 DIAGNOSIS — M25512 Pain in left shoulder: Secondary | ICD-10-CM | POA: Diagnosis not present

## 2018-01-08 NOTE — Therapy (Signed)
[place], [place], [address] Phone: [phone]   Fax:  [phone]  Physical Therapy Treatment  Patient Details  Name: Lisa Cannon MRN: [id number] Date of Birth: June 18, 1981 Referring Provider: Cameron Sprang, MD   Encounter Date: 01/08/2018  PT End of Session - 01/08/18 1017    Visit Number  4    Number of Visits  9    Date for PT Re-Evaluation  01/23/18    Authorization Type  [facility] U[facility]    PT Start Time  1016    PT Stop Time  1055    PT Time Calculation (min)  39 min    Activity Tolerance  Patient tolerated treatment well    Behavior During Therapy  [facility] for tasks assessed/performed       Past Medical History:  Diagnosis Date  . Abnormal Pap smear of cervix   . Migraines     Past Surgical History:  Procedure Laterality Date  . CERVICAL BIOPSY  W/ LOOP ELECTRODE EXCISION  2002  . LAPAROSCOPIC BILATERAL SALPINGECTOMY Bilateral 09/12/2015   Procedure: LAPAROSCOPIC BILATERAL SALPINGECTOMY;  Surgeon: Emily Filbert, MD;  Location: [place] [facility];  Service: Gynecology;  Laterality: Bilateral;  . LAPAROSCOPIC LYSIS OF ADHESIONS  09/12/2015   Procedure: LAPAROSCOPIC LYSIS OF ADHESIONS;  Surgeon: Emily Filbert, MD;  Location: [facility];  Service: Gynecology;;  . TUBAL LIGATION    . WISDOM TOOTH EXTRACTION      There were no vitals filed for this visit.  Subjective Assessment - 01/08/18 1017    Subjective  Neck feels better today, more into shoulders.     Currently in Pain?  Yes    Pain Score  4     Pain Location  Shoulder upper traps    Pain Orientation  Right;Left    Pain Descriptors / Indicators  Tightness                      [facility] Adult PT Treatment/Exercise - 01/08/18 0001      Therapeutic Activites    Therapeutic Activities  Lifting    Lifting  mimick lifting feed bags- focus on scapular retraction      Neck Exercises: Machines for Strengthening   UBE (Upper Arm Bike)  retro 4 min       Neck Exercises: Supine   Neck Retraction Limitations  supine, retraction + lift      Manual Therapy   Manual therapy comments  skilled palpation and monitoring during TPDN    Joint Mobilization  lateral glide cervical levels C4,C5 Bilaterally    Soft tissue mobilization  bilateral upper traps, levator scapulae      Neck Exercises: Stretches   Other Neck Stretches  open book stretch, 3x10 seconds Bilaterally       Trigger Point Dry Needling - 01/08/18 1034    Muscles Treated Upper Body  Upper trapezius;Levator scapulae    Upper Trapezius Response  Twitch reponse elicited;Palpable increased muscle length bilat    Levator Scapulae Response  Twitch response elicited;Palpable increased muscle length bilat                PT Long Term Goals - 12/23/17 1010      PT LONG TERM GOAL #1   Title  Pt will be able to sleep without being woken by neck pain    Baseline  cannot go a night withou neck pain at eval    Time  4  Period  Weeks    Status  New    Target Date  01/23/18      PT LONG TERM GOAL #2   Title  GHJ ER to 5/5 bilat for improved postural support    Baseline  4/5 at eval    Time  4    Period  Weeks    Status  New    Target Date  01/23/18      PT LONG TERM GOAL #3   Title  Pt will be able to perform cervical rotation without increased pain    Baseline  pain in head with rotation at eval    Time  4    Period  Weeks    Status  New    Target Date  01/23/18      PT LONG TERM GOAL #4   Title  FOTO to 19% limitation to indicate significant improvement in functional ability    Baseline  21% limitation at eval    Time  4    Period  Weeks    Status  New    Target Date  01/23/18            Plan - 01/08/18 1310    Clinical Impression Statement  DN to bilateral upper traps & levator today with reduced tension. Notable weakness in deep neck flexors, demo supine chin tuck + lift for 12 seconds. 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     PT Treatment/Interventions  ADLs/Self Care Home Management;Cryotherapy;Electrical Stimulation;Ultrasound;Traction;Moist Heat;Therapeutic activities;Therapeutic exercise;Patient/family education;Neuromuscular re-education;Manual techniques;Passive range of motion;Taping;Dry needling    PT Next Visit Plan  DN PRN, how did liftin go?    PT Home Exercise Plan  scapular retraction, chin tucks, door pec stretch, reduce stomach sleeping; prone retraction, extension, Ws; upper trap & levator stretch    Consulted and Agree with Plan of Care  Patient       Patient will benefit from skilled therapeutic intervention in order to improve the following deficits and impairments:  Improper body mechanics, Pain, Postural dysfunction, Increased muscle spasms, Decreased activity tolerance, Decreased strength  Visit Diagnosis: Cervicalgia  Chronic right shoulder pain  Chronic left shoulder pain  Muscle weakness (generalized)  Abnormal posture     Problem List Patient Active Problem List   Diagnosis Date Noted  . Migraine without aura and without status migrainosus, not intractable 03/07/2015  . Neck pain 03/07/2015  . WEIGHT GAIN 04/06/2010  . GASTROESOPHAGEAL REFLUX DISEASE, MILD, HX OF 04/06/2010  . BENIGN POSITIONAL VERTIGO 11/21/2009  . ALLERGIC RHINITIS 10/12/2009  . COMMON MIGRAINE 05/06/2008    Redina Zeller C. Jem Castro PT, DPT 01/08/18 1:15 PM   [facility] [address] [place], [place], [address] Phone: [phone]   Fax:  [phone]  Name: Lisa Cannon MRN: [id number] Date of Birth: 10/03/1981

## 2018-01-14 ENCOUNTER — Ambulatory Visit: Payer: 59 | Admitting: Physical Therapy

## 2018-01-14 ENCOUNTER — Encounter: Payer: Self-pay | Admitting: Physical Therapy

## 2018-01-14 DIAGNOSIS — R293 Abnormal posture: Secondary | ICD-10-CM

## 2018-01-14 DIAGNOSIS — M542 Cervicalgia: Secondary | ICD-10-CM

## 2018-01-14 DIAGNOSIS — M6281 Muscle weakness (generalized): Secondary | ICD-10-CM | POA: Diagnosis not present

## 2018-01-14 DIAGNOSIS — G8929 Other chronic pain: Secondary | ICD-10-CM | POA: Diagnosis not present

## 2018-01-14 DIAGNOSIS — M25512 Pain in left shoulder: Secondary | ICD-10-CM

## 2018-01-14 DIAGNOSIS — M25511 Pain in right shoulder: Secondary | ICD-10-CM | POA: Diagnosis not present

## 2018-01-14 NOTE — Therapy (Signed)
Lisa Cannon, [place], [address] Phone: [phone]   Fax:  [phone]  Physical Therapy Treatment  Patient Details  Name: Lisa Cannon MRN: [id number] Date of Birth: 1981-09-05 Referring Provider: Cameron Sprang, MD   Encounter Date: 01/14/2018  PT End of Session - 01/14/18 1103    Visit Number  5    Number of Visits  9    Date for PT Re-Evaluation  01/23/18    Authorization Type  [facility]    PT Start Time  1100    PT Stop Time  1134    PT Time Calculation (min)  34 min    Activity Tolerance  Patient tolerated treatment well    Behavior During Therapy  [facility] for tasks assessed/performed       Past Medical History:  Diagnosis Date  . Abnormal Pap smear of cervix   . Migraines     Past Surgical History:  Procedure Laterality Date  . CERVICAL BIOPSY  W/ LOOP ELECTRODE EXCISION  2002  . LAPAROSCOPIC BILATERAL SALPINGECTOMY Bilateral 09/12/2015   Procedure: LAPAROSCOPIC BILATERAL SALPINGECTOMY;  Surgeon: Emily Filbert, MD;  Location: [facility];  Service: Gynecology;  Laterality: Bilateral;  . LAPAROSCOPIC LYSIS OF ADHESIONS  09/12/2015   Procedure: LAPAROSCOPIC LYSIS OF ADHESIONS;  Surgeon: Emily Filbert, MD;  Location: [facility];  Service: Gynecology;;  . TUBAL LIGATION    . WISDOM TOOTH EXTRACTION      There were no vitals filed for this visit.  Subjective Assessment - 01/14/18 1101    Subjective  Felt good for a few days but stayed in a hotel Sunday/Monday night which was notcomfortable. Feels tension along lateral neck and at C5/6 junction. Has had 3 migraines since last visit.     Patient Stated Goals  reduce pain    Currently in Pain?  Yes    Pain Score  5     Pain Location  Neck    Pain Orientation  Right;Left    Pain Descriptors / Indicators  Tightness                      [facility] Adult PT Treatment/Exercise - 01/14/18 0001      Manual Therapy   Manual Therapy  Manual Traction    Manual therapy comments  skilled palpation and monitoring during TPDN    Joint Mobilization  lateral cervical glides, upper cervical APs in prone    Soft tissue mobilization  cervical paraspinals, upper traps, suboccipitals    Manual Traction  manual cervical traction       Trigger Point Dry Needling - 01/14/18 1138    Muscles Treated Upper Body  -- Rt cervical paraspinals                PT Long Term Goals - 12/23/17 1010      PT LONG TERM GOAL #1   Title  Pt will be able to sleep without being woken by neck pain    Baseline  cannot go a night withou neck pain at eval    Time  4    Period  Weeks    Status  New    Target Date  01/23/18      PT LONG TERM GOAL #2   Title  GHJ ER to 5/5 bilat for improved postural support    Baseline  4/5 at eval    Time  4    Period  Weeks  Status  New    Target Date  01/23/18      PT LONG TERM GOAL #3   Title  Pt will be able to perform cervical rotation without increased pain    Baseline  pain in head with rotation at eval    Time  4    Period  Weeks    Status  New    Target Date  01/23/18      PT LONG TERM GOAL #4   Title  FOTO to 19% limitation to indicate significant improvement in functional ability    Baseline  21% limitation at eval    Time  4    Period  Weeks    Status  New    Target Date  01/23/18            Plan - 01/14/18 1135    Clinical Impression Statement  "It feels much better than when I came in"  DN to Rt-sided cervical paraspinals and manual therapy followed. 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 Pt states she should be finished with cluster in the next day or so. Will DN 2/week for next two weeks to see if she is gaining carryover in clusters vs out of clusters to determine if DN is effective treatment with migraines.     PT Treatment/Interventions  ADLs/Self Care Home  Management;Cryotherapy;Electrical Stimulation;Ultrasound;Traction;Moist Heat;Therapeutic activities;Therapeutic exercise;Patient/family education;Neuromuscular re-education;Manual techniques;Passive range of motion;Taping;Dry needling    PT Next Visit Plan  continue DN & manual cervical treatment, did she change driving posture?    PT Home Exercise Plan  scapular retraction, chin tucks, door pec stretch, reduce stomach sleeping; prone retraction, extension, Ws; upper trap & levator stretch    Consulted and Agree with Plan of Care  Patient       Patient will benefit from skilled therapeutic intervention in order to improve the following deficits and impairments:  Improper body mechanics, Pain, Postural dysfunction, Increased muscle spasms, Decreased activity tolerance, Decreased strength  Visit Diagnosis: Cervicalgia  Chronic right shoulder pain  Chronic left shoulder pain  Muscle weakness (generalized)  Abnormal posture     Problem List Patient Active Problem List   Diagnosis Date Noted  . Migraine without aura and without status migrainosus, not intractable 03/07/2015  . Neck pain 03/07/2015  . WEIGHT GAIN 04/06/2010  . GASTROESOPHAGEAL REFLUX DISEASE, MILD, HX OF 04/06/2010  . BENIGN POSITIONAL VERTIGO 11/21/2009  . ALLERGIC RHINITIS 10/12/2009  . COMMON MIGRAINE 05/06/2008   Kialee Kham C. Vian Fluegel PT, DPT 01/14/18 11:39 AM   [facility] [address] [place], [place], [address] Phone: [phone]   Fax:  [phone]  Name: Lisa Cannon MRN: [id number] Date of Birth: Nov 02, 1981

## 2018-01-15 ENCOUNTER — Ambulatory Visit: Payer: 59 | Admitting: Physical Therapy

## 2018-01-15 ENCOUNTER — Encounter: Payer: Self-pay | Admitting: Physical Therapy

## 2018-01-15 DIAGNOSIS — M542 Cervicalgia: Secondary | ICD-10-CM | POA: Diagnosis not present

## 2018-01-15 DIAGNOSIS — M6281 Muscle weakness (generalized): Secondary | ICD-10-CM

## 2018-01-15 DIAGNOSIS — R293 Abnormal posture: Secondary | ICD-10-CM

## 2018-01-15 DIAGNOSIS — G8929 Other chronic pain: Secondary | ICD-10-CM

## 2018-01-15 DIAGNOSIS — M25511 Pain in right shoulder: Secondary | ICD-10-CM

## 2018-01-15 DIAGNOSIS — M25512 Pain in left shoulder: Secondary | ICD-10-CM

## 2018-01-15 MED FILL — NADOLOL 20 MG TAB: 20 | 90 days supply | Qty: 270 | Fill #1

## 2018-01-15 NOTE — Therapy (Signed)
[place], [place], [address] Phone: [phone]   Fax:  [phone]  Physical Therapy Treatment  Patient Details  Name: Lisa Cannon MRN: [id number] Date of Birth: [birth year] Referring Provider: Cameron Sprang, MD   Encounter Date: 01/15/2018  PT End of Session - 01/15/18 0926    Visit Number  6    Number of Visits  9    Date for PT Re-Evaluation  01/23/18    Authorization Type  [employer]    PT Start Time  0926    PT Stop Time  1000    PT Time Calculation (min)  34 min    Activity Tolerance  Patient tolerated treatment well    Behavior During Therapy  [facility] for tasks assessed/performed       Past Medical History:  Diagnosis Date  . Abnormal Pap smear of cervix   . Migraines     Past Surgical History:  Procedure Laterality Date  . CERVICAL BIOPSY  W/ LOOP ELECTRODE EXCISION  2002  . LAPAROSCOPIC BILATERAL SALPINGECTOMY Bilateral 09/12/2015   Procedure: LAPAROSCOPIC BILATERAL SALPINGECTOMY;  Surgeon: Emily Filbert, MD;  Location: [facility];  Service: Gynecology;  Laterality: Bilateral;  . LAPAROSCOPIC LYSIS OF ADHESIONS  09/12/2015   Procedure: LAPAROSCOPIC LYSIS OF ADHESIONS;  Surgeon: Emily Filbert, MD;  Location: [facility];  Service: Gynecology;;  . TUBAL LIGATION    . WISDOM TOOTH EXTRACTION      There were no vitals filed for this visit.  Subjective Assessment - 01/15/18 0926    Subjective  Woke up with a migraine today so feels overall tightness and drained.                       [facility] PT Treatment/Exercise - 01/15/18 0001      Modalities   Modalities  Cryotherapy      Cryotherapy   Number Minutes Cryotherapy  10 Minutes    Cryotherapy Location  Cervical    Type of Cryotherapy  Ice pack      Manual Therapy   Manual therapy comments  holding head in neutral alignment     Soft tissue mobilization  cervical paraspinals, upper traps, suboccipitals, bil levator scapula    Manual Traction  manual cervical traction       Trigger Point Dry Needling - 01/14/18 1138    Muscles Treated Upper Body  -- Rt cervical paraspinals                PT Long Term Goals - 12/23/17 1010      PT LONG TERM GOAL #1   Title  Pt will be able to sleep without being woken by neck pain    Baseline  cannot go a night withou neck pain at eval    Time  4    Period  Weeks    Status  New    Target Date  01/23/18      PT LONG TERM GOAL #2   Title  GHJ ER to 5/5 bilat for improved postural support    Baseline  4/5 at eval    Time  4    Period  Weeks    Status  New    Target Date  01/23/18      PT LONG TERM GOAL #3   Title  Pt will be able to perform cervical rotation without increased pain    Baseline  pain in head with  rotation at eval    Time  4    Period  Weeks    Status  New    Target Date  01/23/18      PT LONG TERM GOAL #4   Title  FOTO to 19% limitation to indicate significant improvement in functional ability    Baseline  21% limitation at eval    Time  4    Period  Weeks    Status  New    Target Date  01/23/18            Plan - 01/15/18 0950    Clinical Impression Statement  Pt was feeling drained and generally tight after migraine this morning. Significant tightness noted bilaterally and rested in cervical extension in supine. Manual therapy techniques utilized to decrease tension and improve postural alignment. Pt verbalized improvement.    PT Treatment/Interventions  ADLs/Self Care Home Management;Cryotherapy;Electrical Stimulation;Ultrasound;Traction;Moist Heat;Therapeutic activities;Therapeutic exercise;Patient/family education;Neuromuscular re-education;Manual techniques;Passive range of motion;Taping;Dry needling    PT Next Visit Plan  continue DN & manual cervical treatment, did she change driving posture?    PT Home Exercise Plan  scapular retraction, chin tucks, door pec stretch, reduce stomach sleeping; prone retraction, extension, Ws;  upper trap & levator stretch    Consulted and Agree with Plan of Care  Patient       Patient will benefit from skilled therapeutic intervention in order to improve the following deficits and impairments:  Improper body mechanics, Pain, Postural dysfunction, Increased muscle spasms, Decreased activity tolerance, Decreased strength  Visit Diagnosis: Cervicalgia  Chronic right shoulder pain  Chronic left shoulder pain  Muscle weakness (generalized)  Abnormal posture     Problem List Patient Active Problem List   Diagnosis Date Noted  . Migraine without aura and without status migrainosus, not intractable 03/07/2015  . Neck pain 03/07/2015  . WEIGHT GAIN 04/06/2010  . GASTROESOPHAGEAL REFLUX DISEASE, MILD, HX OF 04/06/2010  . BENIGN POSITIONAL VERTIGO 11/21/2009  . ALLERGIC RHINITIS 10/12/2009  . COMMON MIGRAINE 05/06/2008    Samadhi Mahurin C. Jil Penland PT, DPT 01/15/18 10:05 AM   [place], [place], [address] Phone: [phone]   Fax:  [phone]  Name: Lisa Cannon MRN: [id number] Date of Birth: 1981-01-23

## 2018-01-19 DIAGNOSIS — H6121 Impacted cerumen, right ear: Secondary | ICD-10-CM | POA: Diagnosis not present

## 2018-01-19 DIAGNOSIS — H6063 Unspecified chronic otitis externa, bilateral: Secondary | ICD-10-CM | POA: Diagnosis not present

## 2018-01-19 MED FILL — CLOTRIMAZOLE 1% SOLUTION: 1 | 30 days supply | Qty: 30 | Fill #0

## 2018-01-20 ENCOUNTER — Ambulatory Visit: Payer: 59 | Admitting: Physical Therapy

## 2018-01-20 ENCOUNTER — Encounter: Payer: Self-pay | Admitting: Physical Therapy

## 2018-01-20 DIAGNOSIS — M542 Cervicalgia: Secondary | ICD-10-CM

## 2018-01-20 DIAGNOSIS — M25512 Pain in left shoulder: Secondary | ICD-10-CM

## 2018-01-20 DIAGNOSIS — M25511 Pain in right shoulder: Secondary | ICD-10-CM | POA: Diagnosis not present

## 2018-01-20 DIAGNOSIS — R293 Abnormal posture: Secondary | ICD-10-CM | POA: Diagnosis not present

## 2018-01-20 DIAGNOSIS — M6281 Muscle weakness (generalized): Secondary | ICD-10-CM | POA: Diagnosis not present

## 2018-01-20 DIAGNOSIS — G8929 Other chronic pain: Secondary | ICD-10-CM | POA: Diagnosis not present

## 2018-01-20 NOTE — Therapy (Signed)
[place], [place], [address] Phone: [phone]   Fax:  [phone]  Physical Therapy Treatment/Discharge Summary  Patient Details  Name: RAMONIA MCCLARAN MRN: [id number] Date of Birth: 1981-03-08 Referring Provider: Cameron Sprang, MD   Encounter Date: 01/20/2018  PT End of Session - 01/20/18 0932    Visit Number  7    Number of Visits  9    Date for PT Re-Evaluation  01/23/18    Authorization Type  [facility] [employer]    PT Start Time  0930    PT Stop Time  0948    PT Time Calculation (min)  18 min    Activity Tolerance  Patient tolerated treatment well    Behavior During Therapy  [facility] for tasks assessed/performed       Past Medical History:  Diagnosis Date  . Abnormal Pap smear of cervix   . Migraines     Past Surgical History:  Procedure Laterality Date  . CERVICAL BIOPSY  W/ LOOP ELECTRODE EXCISION  2002  . LAPAROSCOPIC BILATERAL SALPINGECTOMY Bilateral 09/12/2015   Procedure: LAPAROSCOPIC BILATERAL SALPINGECTOMY;  Surgeon: Emily Filbert, MD;  Location: [facility];  Service: Gynecology;  Laterality: Bilateral;  . LAPAROSCOPIC LYSIS OF ADHESIONS  09/12/2015   Procedure: LAPAROSCOPIC LYSIS OF ADHESIONS;  Surgeon: Emily Filbert, MD;  Location: [facility];  Service: Gynecology;;  . TUBAL LIGATION    . WISDOM TOOTH EXTRACTION      There were no vitals filed for this visit.  Subjective Assessment - 01/20/18 0933    Subjective  feels tight at the base of skull. migraines have not been great.                               PT Education - 01/20/18 0940    Education provided  Yes    Education Details  cranio cradle, goals discussion, FOTO, importance of continued HEP    Person(s) Educated  Patient    Methods  Explanation    Comprehension  Verbalized understanding          PT Long Term Goals - 01/20/18 0941      PT LONG TERM GOAL #1   Title  Pt will be able to sleep without being woken by  neck pain    Baseline  not woken by neck pain    Status  Achieved      PT LONG TERM GOAL #2   Title  GHJ ER to 5/5 bilat for improved postural support    Baseline  5/5    Status  Achieved      PT LONG TERM GOAL #3   Title  Pt will be able to perform cervical rotation without increased pain    Baseline  able    Status  Achieved      PT LONG TERM GOAL #4   Title  FOTO to 19% limitation to indicate significant improvement in functional ability    Status  Not Met            Plan - 01/20/18 0949    Clinical Impression Statement  Pt has made significant improvements in regards to posture, strength activation and cervical mobility but continues to have migraines of unchanged intensity. Educated pt on importance of continuing stretching and postural awareness to decrease myofascial input to HA pain. Educated on use of cranio cradle for support and mild traction of cervical  region. Pt will be d/c at this time due to lack of migraine changes and was encouraged to contact us with any further questions.     PT Treatment/Interventions  ADLs/Self Care Home Management;Cryotherapy;Electrical Stimulation;Ultrasound;Traction;Moist Heat;Therapeutic activities;Therapeutic exercise;Patient/family education;Neuromuscular re-education;Manual techniques;Passive range of motion;Taping;Dry needling    PT Home Exercise Plan  scapular retraction, chin tucks, door pec stretch, reduce stomach sleeping; prone retraction, extension, Ws; upper trap & levator stretch    Consulted and Agree with Plan of Care  Patient       Patient will benefit from skilled therapeutic intervention in order to improve the following deficits and impairments:  Improper body mechanics, Pain, Postural dysfunction, Increased muscle spasms, Decreased activity tolerance, Decreased strength  Visit Diagnosis: Cervicalgia  Chronic right shoulder pain  Chronic left shoulder pain  Muscle weakness (generalized)  Abnormal  posture     Problem List Patient Active Problem List   Diagnosis Date Noted  . Migraine without aura and without status migrainosus, not intractable 03/07/2015  . Neck pain 03/07/2015  . WEIGHT GAIN 04/06/2010  . GASTROESOPHAGEAL REFLUX DISEASE, MILD, HX OF 04/06/2010  . BENIGN POSITIONAL VERTIGO 11/21/2009  . ALLERGIC RHINITIS 10/12/2009  . COMMON MIGRAINE 05/06/2008    PHYSICAL THERAPY DISCHARGE SUMMARY  Visits from Start of Care: 7  Current functional level related to goals / functional outcomes: See above   Remaining deficits: See above   Education / Equipment: Anatomy of condition, POC, HEP, exercise form/rationale  Plan: Patient agrees to discharge.  Patient goals were partially met. Patient is being discharged due to lack of progress.  ?????      Kelina Beauchamp C. Myrick Mcnairy PT, DPT 01/20/18 9:53 AM   [facility] [address] [place], [place], [address] Phone: [phone]   Fax:  [phone]  Name: TERRY ABILA MRN: [id number] Date of Birth: 1981-03-08

## 2018-01-22 ENCOUNTER — Encounter: Payer: 59 | Admitting: Physical Therapy

## 2018-01-27 ENCOUNTER — Encounter: Payer: 59 | Admitting: Physical Therapy

## 2018-01-29 ENCOUNTER — Encounter: Payer: 59 | Admitting: Physical Therapy

## 2018-02-02 MED FILL — FROVATRIPTAN SUCC 2.5 MG TA: 2.5 | 27 days supply | Qty: 9 | Fill #4

## 2018-02-02 MED FILL — CYCLOBENZAPRINE 5 MG TABLET: 5 | 90 days supply | Qty: 90 | Fill #1

## 2018-02-02 MED FILL — SUMATRIPTAN SUCC 100 MG TAB: 100 | 30 days supply | Qty: 8 | Fill #2

## 2018-03-01 MED FILL — SUMATRIPTAN SUCC 100 MG TAB: 100 | 30 days supply | Qty: 8 | Fill #3

## 2018-03-01 MED FILL — FROVATRIPTAN SUCC 2.5 MG TA: 2.5 | 27 days supply | Qty: 9 | Fill #5

## 2018-03-02 MED FILL — GABAPENTIN 100 MG CAPSULE: 100 | 30 days supply | Qty: 90 | Fill #1

## 2018-03-09 DIAGNOSIS — H6062 Unspecified chronic otitis externa, left ear: Secondary | ICD-10-CM | POA: Diagnosis not present

## 2018-03-17 ENCOUNTER — Telehealth: Payer: Self-pay

## 2018-03-17 DIAGNOSIS — D229 Melanocytic nevi, unspecified: Secondary | ICD-10-CM

## 2018-03-17 NOTE — Telephone Encounter (Signed)
OK to place new referral 

## 2018-03-18 NOTE — Telephone Encounter (Signed)
Referral placed.

## 2018-03-29 MED FILL — FROVATRIPTAN SUCC 2.5 MG TA: 2.5 | 27 days supply | Qty: 9 | Fill #6

## 2018-03-29 MED FILL — SUMATRIPTAN SUCC 100 MG TAB: 100 | 30 days supply | Qty: 8 | Fill #4

## 2018-04-02 ENCOUNTER — Telehealth: Payer: Self-pay | Admitting: Family Medicine

## 2018-04-02 ENCOUNTER — Ambulatory Visit: Payer: 59 | Admitting: Family Medicine

## 2018-04-02 ENCOUNTER — Encounter: Payer: Self-pay | Admitting: Family Medicine

## 2018-04-02 VITALS — BP 118/64 | HR 79 | Ht 66.0 in | Wt 215.0 lb

## 2018-04-02 DIAGNOSIS — Z6831 Body mass index (BMI) 31.0-31.9, adult: Secondary | ICD-10-CM | POA: Diagnosis not present

## 2018-04-02 DIAGNOSIS — R635 Abnormal weight gain: Secondary | ICD-10-CM

## 2018-04-02 DIAGNOSIS — Z0000 Encounter for general adult medical examination without abnormal findings: Secondary | ICD-10-CM

## 2018-04-02 LAB — COMPLETE METABOLIC PANEL WITH GFR
AG Ratio: 1.6 (calc) (ref 1.0–2.5)
ALBUMIN MSPROF: 4.6 g/dL (ref 3.6–5.1)
ALKALINE PHOSPHATASE (APISO): 72 U/L (ref 33–115)
ALT: 34 U/L — AB (ref 6–29)
AST: 23 U/L (ref 10–30)
BILIRUBIN TOTAL: 0.8 mg/dL (ref 0.2–1.2)
BUN: 12 mg/dL (ref 7–25)
CHLORIDE: 103 mmol/L (ref 98–110)
CO2: 27 mmol/L (ref 20–32)
CREATININE: 0.84 mg/dL (ref 0.50–1.10)
Calcium: 9.6 mg/dL (ref 8.6–10.2)
GFR, Est African American: 104 mL/min/{1.73_m2} (ref >=60)
GFR, Est Non African American: 90 mL/min/{1.73_m2} (ref >=60)
GLUCOSE: 101 mg/dL — AB (ref 65–99)
Globulin: 2.8 g/dL (calc) (ref 1.9–3.7)
Potassium: 4.5 mmol/L (ref 3.5–5.3)
Sodium: 139 mmol/L (ref 135–146)
Total Protein: 7.4 g/dL (ref 6.1–8.1)

## 2018-04-02 LAB — LIPID PANEL
CHOL/HDL RATIO: 4.6 (calc) (ref <5.0)
CHOLESTEROL: 189 mg/dL (ref <200)
HDL: 41 mg/dL — AB (ref >50)
LDL CHOLESTEROL (CALC): 125 mg/dL — AB
Non-HDL Cholesterol (Calc): 148 mg/dL (calc) — ABNORMAL HIGH (ref <130)
TRIGLYCERIDES: 118 mg/dL (ref <150)

## 2018-04-02 MED ORDER — LORCASERIN HCL ER 20 MG PO TB24: 1.0000 | ORAL_TABLET | Freq: Every day | ORAL | 1 refills | Status: DC

## 2018-04-02 NOTE — Progress Notes (Signed)
Subjective:     Lisa Cannon is a 37 y.o. female and is here for a comprehensive physical exam. The patient reports problems - she would really like to discuss options for weight loss..  Social History   Socioeconomic History  . Marital status: Married    Spouse name: Not on file  . Number of children: Not on file  . Years of education: Not on file  . Highest education level: Not on file  Occupational History  . Occupation: homemaker  Social Needs  . Financial resource strain: Not on file  . Food insecurity:    Worry: Not on file    Inability: Not on file  . Transportation needs:    Medical: Not on file    Non-medical: Not on file  Tobacco Use  . Smoking status: Never Smoker  . Smokeless tobacco: Never Used  Substance and Sexual Activity  . Alcohol use: No    Alcohol/week: 0.0 oz  . Drug use: No  . Sexual activity: Yes    Partners: Male    Birth control/protection: Surgical  Lifestyle  . Physical activity:    Days per week: Not on file    Minutes per session: Not on file  . Stress: Not on file  Relationships  . Social connections:    Talks on phone: Not on file    Gets together: Not on file    Attends religious service: Not on file    Active member of club or organization: Not on file    Attends meetings of clubs or organizations: Not on file    Relationship status: Not on file  . Intimate partner violence:    Fear of current or ex partner: Not on file    Emotionally abused: Not on file    Physically abused: Not on file    Forced sexual activity: Not on file  Other Topics Concern  . Not on file  Social History Narrative  . Not on file   Health Maintenance  Topic Date Due  . INFLUENZA VACCINE  07/02/2018  . PAP SMEAR  12/10/2020  . TETANUS/TDAP  03/20/2022  . HIV Screening  Completed    The following portions of the patient's history were reviewed and updated as appropriate: allergies, current medications, past family history, past medical history,  past social history, past surgical history and problem list.  Review of Systems A comprehensive review of systems was negative.   Objective:    BP 118/64   Pulse 79   Ht 5\' 6"  (1.676 m)   Wt 215 lb (97.5 kg)   SpO2 100%   BMI 34.70 kg/m  General appearance: alert, cooperative and appears stated age Head: Normocephalic, without obvious abnormality, atraumatic Eyes: conj clear, EOMI, PEERLA Ears: normal TM's and external ear canals both ears Nose: Nares normal. Septum midline. Mucosa normal. No drainage or sinus tenderness. Throat: lips, mucosa, and tongue normal; teeth and gums normal Neck: no adenopathy, no carotid bruit, no JVD, supple, symmetrical, trachea midline and thyroid not enlarged, symmetric, no tenderness/mass/nodules Back: symmetric, no curvature. ROM normal. No CVA tenderness. Lungs: clear to auscultation bilaterally Breasts: not performed Heart: regular rate and rhythm, S1, S2 normal, no murmur, click, rub or gallop Abdomen: soft, non-tender; bowel sounds normal; no masses,  no organomegaly Extremities: extremities normal, atraumatic, no cyanosis or edema Pulses: 2+ and symmetric Skin: Skin color, texture, turgor normal. No rashes or lesions Lymph nodes: Cervical adenopathy: nl and Supraclavicular adenopathy: nl Neurologic: Alert and oriented X  3, normal strength and tone. Normal symmetric reflexes. Normal coordination and gait    Assessment:    Healthy female exam.      Plan:     See After Visit Summary for Counseling Recommendations   Keep up a regular exercise program and make sure you are eating a healthy diet Try to eat 4 servings of dairy a day, or if you are lactose intolerant take a calcium with vitamin D daily.  Your vaccines are up to date.  Is with OB/GYN. Due for screening labs.  Lab slip printed and given today.   Abnormal weight gain/BMI 31 - Discussed options.  She is tried [place] in the past and says that it gave her diarrhea.  She does  not want to use Qsymia because of the Topamax and it.  She was on that for migraines several years ago and did not do well at all.  She would prefer to stay away from stimulants if at all possible.  Thus we will try Belviq.  Prescription sent to pharmacy.  I did let her know that it may require some prior authorization.  She was 175 pounds back in 2015 and ultimately she would like to get to around 170 pounds if at all possible.  [place] and is very active and does a lot of strength type activities but does not really get a lot of cardio per se.   Current weight: 215 pounds Previous weight: 214 pounds Change in weight: up 1 pound. Goal weight: 175 pounds Exercise goal: 15 minutes of cardio 3 days/week Dietary goal: Less eating out for breakfast.  Cutting back on carbs and sugars overall but not completely eliminating. Medication: Belviq new start. Follow-up: 1 month

## 2018-04-02 NOTE — Telephone Encounter (Signed)
Received fax from [employer] that Belviq requires a PA. Information has been sent to the insurance company. Awaiting determination.   

## 2018-04-02 NOTE — Patient Instructions (Addendum)
Current weight: 215 pounds Previous weight: 214 pounds Change in weight: up 1 pound. Goal weight: 175 pounds Exercise goal: 15 minutes of cardio 3 days/week Dietary goal: Less eating out for breakfast.  Cutting back on carbs and sugars overall but not completely eliminating. Medication: Belviq new start. Follow-up: 1 month      Preventive Care 18-39 Years, Female Preventive care refers to lifestyle choices and visits with your health care provider that can promote health and wellness. What does preventive care include?  A yearly physical exam. This is also called an annual well check.  Dental exams once or twice a year.  Routine eye exams. Ask your health care provider how often you should have your eyes checked.  Personal lifestyle choices, including: ? Daily care of your teeth and gums. ? Regular physical activity. ? Eating a healthy diet. ? Avoiding tobacco and drug use. ? Limiting alcohol use. ? Practicing safe sex. ? Taking vitamin and mineral supplements as recommended by your health care provider. What happens during an annual well check? The services and screenings done by your health care provider during your annual well check will depend on your age, overall health, lifestyle risk factors, and family history of disease. Counseling Your health care provider may ask you questions about your:  Alcohol use.  Tobacco use.  Drug use.  Emotional well-being.  Home and relationship well-being.  Sexual activity.  Eating habits.  Work and work Statistician.  Method of birth control.  Menstrual cycle.  Pregnancy history.  Screening You may have the following tests or measurements:  Height, weight, and BMI.  Diabetes screening. This is done by checking your blood sugar (glucose) after you have not eaten for a while (fasting).  Blood pressure.  Lipid and cholesterol levels. These may be checked every 5 years starting at age 71.  Skin check.  Hepatitis  C blood test.  Hepatitis B blood test.  Sexually transmitted disease (STD) testing.  BRCA-related cancer screening. This may be done if you have a family history of breast, ovarian, tubal, or peritoneal cancers.  Pelvic exam and Pap test. This may be done every 3 years starting at age 50. Starting at age 69, this may be done every 5 years if you have a Pap test in combination with an HPV test.  Discuss your test results, treatment options, and if necessary, the need for more tests with your health care provider. Vaccines Your health care provider may recommend certain vaccines, such as:  Influenza vaccine. This is recommended every year.  Tetanus, diphtheria, and acellular pertussis (Tdap, Td) vaccine. You may need a Td booster every 10 years.  Varicella vaccine. You may need this if you have not been vaccinated.  HPV vaccine. If you are 29 or younger, you may need three doses over 6 months.  Measles, mumps, and rubella (MMR) vaccine. You may need at least one dose of MMR. You may also need a second dose.  Pneumococcal 13-valent conjugate (PCV13) vaccine. You may need this if you have certain conditions and were not previously vaccinated.  Pneumococcal polysaccharide (PPSV23) vaccine. You may need one or two doses if you smoke cigarettes or if you have certain conditions.  Meningococcal vaccine. One dose is recommended if you are age 44-21 years and a first-year college student living in a residence hall, or if you have one of several medical conditions. You may also need additional booster doses.  Hepatitis A vaccine. You may need this if you have certain conditions  or if you travel or work in places where you may be exposed to hepatitis A.  Hepatitis B vaccine. You may need this if you have certain conditions or if you travel or work in places where you may be exposed to hepatitis B.  Haemophilus influenzae type b (Hib) vaccine. You may need this if you have certain risk  factors.  Talk to your health care provider about which screenings and vaccines you need and how often you need them. This information is not intended to replace advice given to you by your health care provider. Make sure you discuss any questions you have with your health care provider. Document Released: 01/14/2002 Document Revised: 08/07/2016 Document Reviewed: 09/19/2015 Elsevier Interactive Patient Education  [employer].

## 2018-04-03 ENCOUNTER — Other Ambulatory Visit: Payer: Self-pay | Admitting: *Deleted

## 2018-04-03 DIAGNOSIS — R945 Abnormal results of liver function studies: Principal | ICD-10-CM

## 2018-04-03 DIAGNOSIS — R7989 Other specified abnormal findings of blood chemistry: Secondary | ICD-10-CM

## 2018-04-03 LAB — HEMOGLOBIN A1C
HEMOGLOBIN A1C: 5.6 %{Hb} (ref <5.7)
MEAN PLASMA GLUCOSE: 114 (calc)
eAG (mmol/L): 6.3 (calc)

## 2018-04-06 MED ORDER — NALTREXONE-BUPROPION HCL ER 8-90 MG PO TB12: ORAL_TABLET | ORAL | 1 refills | Status: DC

## 2018-04-06 NOTE — Telephone Encounter (Signed)
Received denial letter from [place] that [place] did not meet medical necessity. I did put the BMI and the exercise program in the section that asked for more clinical data. Do you want to try contrave or appeal? Please advise.

## 2018-04-06 NOTE — Telephone Encounter (Signed)
Is call patient and just give her heads up and let her know that the Belviq did not go through but we are still going to work on it.  In the meantime it does look like they will cover Contrave which is another weight loss medication on the market.  Some to go ahead and set her up to for a prescription for that one

## 2018-04-06 NOTE — Telephone Encounter (Signed)
Left message for patient to call back about note below.  

## 2018-04-07 ENCOUNTER — Telehealth: Payer: Self-pay | Admitting: Family Medicine

## 2018-04-07 NOTE — Telephone Encounter (Signed)
Received fax from [employer] that Contrave requires a PA. Information has been sent to the insurance company. Awaiting determination.   

## 2018-04-10 NOTE — Telephone Encounter (Signed)
Received fax from [employer] that [place] was approved from 04/09/2018 through 07/30/2018.  Pharmacy notified and forms sent to scan.   Reference ID: [id number].

## 2018-04-13 ENCOUNTER — Telehealth: Payer: Self-pay

## 2018-04-13 DIAGNOSIS — L819 Disorder of pigmentation, unspecified: Secondary | ICD-10-CM | POA: Diagnosis not present

## 2018-04-13 DIAGNOSIS — L820 Inflamed seborrheic keratosis: Secondary | ICD-10-CM | POA: Diagnosis not present

## 2018-04-13 DIAGNOSIS — D2262 Melanocytic nevi of left upper limb, including shoulder: Secondary | ICD-10-CM | POA: Diagnosis not present

## 2018-04-13 NOTE — Telephone Encounter (Signed)
Lisa Cannon would like to switch back to the [place] and not take the Contrave. She states she has had side effects to topamax of up set stomach and not feeling well. Please advise.

## 2018-04-14 ENCOUNTER — Encounter: Payer: Self-pay | Admitting: Neurology

## 2018-04-14 ENCOUNTER — Other Ambulatory Visit: Payer: Self-pay

## 2018-04-14 ENCOUNTER — Ambulatory Visit: Payer: 59 | Admitting: Neurology

## 2018-04-14 VITALS — BP 106/64 | HR 83 | Ht 66.0 in | Wt 217.0 lb

## 2018-04-14 DIAGNOSIS — G43009 Migraine without aura, not intractable, without status migrainosus: Secondary | ICD-10-CM | POA: Diagnosis not present

## 2018-04-14 MED ORDER — LIRAGLUTIDE -WEIGHT MANAGEMENT 18 MG/3ML ~~LOC~~ SOPN: PEN_INJECTOR | SUBCUTANEOUS | 1 refills | Status: DC

## 2018-04-14 MED ORDER — ERENUMAB-AOOE 70 MG/ML ~~LOC~~ SOAJ: 1.0000 | SUBCUTANEOUS | 11 refills | Status: DC

## 2018-04-14 NOTE — Patient Instructions (Addendum)
1. We will start process to get Aimovig approved. 2. Continue current medications for now 3. Stop Frova 4. Try Migranal nasal spray at the time of migraine. If helpful, we will send in refills, you can take it daily for 4 days during your menstrual period 5. Keep a calendar of migraines 6. Follow-up in 4-5 months, call for any changes

## 2018-04-14 NOTE — Telephone Encounter (Signed)
OK to send new rx for restart Saxenda.  Start 0.6 x 1 wk, then 1.2 x 1 week, then 1.8 mg x 1 week, then 2.4 daily

## 2018-04-14 NOTE — Telephone Encounter (Signed)
rx sent. Left VM advising Pt.

## 2018-04-14 NOTE — Progress Notes (Signed)
NEUROLOGY FOLLOW UP OFFICE NOTE  Lisa Cannon [id number]  DOB: [birth year]  HISTORY OF PRESENT ILLNESS: I had the pleasure of seeing Lisa Cannon in follow-up in the neurology clinic on 04/14/2018.  The patient was last seen 4 months ago for worsening migraines. On her last visit, she reported the intensity of migraines were less with addition of gabapentin, but she continued to have 7-8 migraines a month, clustering during and after her menstrual period. She was prescribed Frova as preventative around the time of her period, otherwise she has Imitrex for rescue. She is taking Nadolol 60mg  daily and gabapentin 100 mg qhs for migraine prevention. She had side effects on higher dose of gabapentin. She feels the Frova is causing more headaches or making them more prolonged. She has noticed an increase in migraines occurring 10-12 times a month, still mostly around the time of her period. She tried doing PT for neck pain, dry needling helped a little with the neck pain but did not help with her migraines. She denies any vision changes, dizziness, focal numbness/tingling/weakness, no falls.   HPI: This is a very pleasant 37 yo RH woman with a history of migraines since her pre-teen years. This increased in frequency and severity when she was in college, with several migraines every week. Headaches are usually over the left frontal and retroorbital region, at times radiating to the left side of her neck. This is associated with nausea, dizziness, tightening in her jaw area, and mild photo/phonophobia. No migraine auras. She was initially started on Topamax which caused more headaches and nausea. Butalbital does not help. She was switched to Corgard (Nadolol) 5 years ago, which significantly helped reduce headaches to once a month, usually a day before she would start her menstrual period. She stopped the medication when she became pregnant with her daughter. Since then, she would have 1  migraine/month on average, with good response to Imitrex.Migraine triggers include lack of sleep and allergies. Her father and possibly paternal grandmother had migraines.   Prior Preventatives: Topamax, Zonisamide, nortriptyline Prior rescue medications: Butalbital, Aleve, Frova  PAST MEDICAL HISTORY: Past Medical History:  Diagnosis Date  . Abnormal Pap smear of cervix   . Migraines     MEDICATIONS: Current Outpatient Medications on File Prior to Visit  Medication Sig Dispense Refill  . cyclobenzaprine (FLEXERIL) 5 MG tablet Take 1 tablet daily as needed for neck pain 90 tablet 3  . fluticasone (FLONASE) 50 MCG/ACT nasal spray Place into both nostrils as needed for allergies or rhinitis.    . frovatriptan (FROVA) 2.5 MG tablet TAKE 1 TABLET BY MOUTH TWICE DAILY FOR 5 DAYS AROUND THE TIME OF YOUR MENSTRUAL PERIOD 10 tablet 6  . gabapentin (NEURONTIN) 100 MG capsule Take 3 caps at night 90 capsule 6  . Liraglutide -Weight Management (SAXENDA) 18 MG/3ML SOPN Inject 0.6 mg into the skin daily for 7 days, THEN 1.2 mg daily for 7 days, THEN 1.8 mg daily for 7 days, THEN 2.4 mg daily for 7 days. 4 pen 1  . Lorcaserin HCl ER (BELVIQ XR) 20 MG TB24 Take 1 tablet by mouth daily. 30 tablet 1  . Multiple Vitamin (MULTIVITAMIN) capsule Take 1 capsule by mouth daily.    . nadolol (CORGARD) 20 MG tablet TAKE 3 TABLETS BY MOUTH DAILY 270 tablet 3  . Naltrexone-buPROPion HCl ER (CONTRAVE) 8-90 MG TB12 1 tablet p.o. every morning x7 days, then 1 tablet twice a day x7 days, then 2 tablets every morning and  1 in p.m. x7 days, then 2 tabs p.o. twice daily 120 tablet 1  . naproxen sodium (ALEVE) 220 MG tablet Take 220 mg by mouth daily as needed (for pain).    . Probiotic Product (PROBIOTIC PO) Take by mouth.    . SUMAtriptan (IMITREX) 100 MG tablet TAKE 1 TABLET BY MOUTH AT ONSET OF HEADACHE. MAY REPEAT DOSE AFTER 2 HOURS. DO NOT TAKE MORE THAN 2 DAYS A WEEK. 30 tablet 3   No current  facility-administered medications on file prior to visit.     ALLERGIES: No Known Allergies  FAMILY HISTORY: Family History  Problem Relation Age of Onset  . Hypertension Maternal Grandfather   . Cancer Paternal Grandfather        lung cancer  . Cancer Paternal Grandmother        brain  . Diabetes Maternal Aunt   . Hypertension Maternal Aunt     SOCIAL HISTORY: Social History   Socioeconomic History  . Marital status: Married    Spouse name: Not on file  . Number of children: Not on file  . Years of education: Not on file  . Highest education level: Not on file  Occupational History  . Occupation: homemaker  Social Needs  . Financial resource strain: Not on file  . Food insecurity:    Worry: Not on file    Inability: Not on file  . Transportation needs:    Medical: Not on file    Non-medical: Not on file  Tobacco Use  . Smoking status: Never Smoker  . Smokeless tobacco: Never Used  Substance and Sexual Activity  . Alcohol use: No    Alcohol/week: 0.0 oz  . Drug use: No  . Sexual activity: Yes    Partners: Male    Birth control/protection: Surgical  Lifestyle  . Physical activity:    Days per week: Not on file    Minutes per session: Not on file  . Stress: Not on file  Relationships  . Social connections:    Talks on phone: Not on file    Gets together: Not on file    Attends religious service: Not on file    Active member of club or organization: Not on file    Attends meetings of clubs or organizations: Not on file    Relationship status: Not on file  . Intimate partner violence:    Fear of current or ex partner: Not on file    Emotionally abused: Not on file    Physically abused: Not on file    Forced sexual activity: Not on file  Other Topics Concern  . Not on file  Social History Narrative  . Not on file    REVIEW OF SYSTEMS: Constitutional: No fevers, chills, or sweats, no generalized fatigue, change in appetite Eyes: No visual changes,  double vision, eye pain Ear, nose and throat: No hearing loss, ear pain, nasal congestion, sore throat Cardiovascular: No chest pain, palpitations Respiratory:  No shortness of breath at rest or with exertion, wheezes GastrointestinaI: No nausea, vomiting, diarrhea, abdominal pain, fecal incontinence Genitourinary:  No dysuria, urinary retention or frequency Musculoskeletal:  + neck pain, no back pain Integumentary: No rash, pruritus, skin lesions Neurological: as above Psychiatric: No depression, insomnia, anxiety Endocrine: No palpitations, fatigue, diaphoresis, mood swings, change in appetite, change in weight, increased thirst Hematologic/Lymphatic:  No anemia, purpura, petechiae. Allergic/Immunologic: no itchy/runny eyes, nasal congestion, recent allergic reactions, rashes  PHYSICAL EXAM: Vitals:   04/14/18 1500  BP:  106/64  Pulse: 83  SpO2: 98%   General: No acute distress Head:  Normocephalic/atraumatic Neck: supple, no paraspinal tenderness, full range of motion Heart:  Regular rate and rhythm Lungs:  Clear to auscultation bilaterally Back: No paraspinal tenderness Skin/Extremities: No rash, no edema Neurological Exam: alert and oriented to person, place, and time. No aphasia or dysarthria. Fund of knowledge is appropriate.  Recent and remote memory are intact.  Attention and concentration are normal.    Able to name objects and repeat phrases. Cranial nerves: Pupils equal, round, reactive to light.  Fundoscopic exam unremarkable, no papilledema. Extraocular movements intact with no nystagmus. Visual fields full. Facial sensation intact. No facial asymmetry. Tongue, uvula, palate midline.  Motor: Bulk and tone normal, muscle strength 5/5 throughout with no pronator drift.  Sensation to light touch intact.  No extinction to double simultaneous stimulation.  Deep tendon reflexes 2+ throughout, toes downgoing.  Finger to nose testing intact.  Gait narrow-based and steady, able to  tandem walk adequately.  Romberg negative.  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 Headaches have improved with discontinuation of OCPs, however she continues to report 10-12 migraines a month on Nadolol 60mg  daily and gabapentin 100mg  qhs. She had side effects on higher dose Gabapentin. She has tried multiple preventative medications in the past, and has tried Aleve and Frova for catamenial migraines. She will try Migranal for the menstrual migraines and was given samples. If helpful, we will send in refills to use spray daily for 4 days around the time of her migraines (may repeat another dose after 15 minutes). She is a good candidate for Aimovig for migraine prevention, prescription will be sent. Side effects were discussed. Continue prn Imitrex and Flexeril for neck pain. She will continue headache calendar and follow-up in 5 months.   Thank you for allowing me to participate in her care.  Please do not hesitate to call for any questions or concerns.  The duration of this appointment visit was 25 minutes of face-to-face time with the patient.  Greater than 50% of this time was spent in counseling, explanation of diagnosis, planning of further management, and coordination of care.   Lisa Cannon, M.D.   CC: Dr. Madilyn Fireman

## 2018-04-16 ENCOUNTER — Telehealth: Payer: Self-pay

## 2018-04-16 MED FILL — NADOLOL 20 MG TAB: 20 | 90 days supply | Qty: 270 | Fill #2

## 2018-04-16 NOTE — Telephone Encounter (Signed)
Saxenda taken off the medication list per patient request.

## 2018-04-16 NOTE — Addendum Note (Signed)
Addended by: Dessie Coma on: 04/16/2018 09:53 AM   Modules accepted: Orders

## 2018-04-16 NOTE — Telephone Encounter (Signed)
Contrave taken off medication list also.

## 2018-04-20 ENCOUNTER — Telehealth: Payer: Self-pay

## 2018-04-20 NOTE — Telephone Encounter (Signed)
Received notice of approval for pts Aimovig Injection.   Approval reference # [id number]

## 2018-04-21 MED FILL — AIMOVIG 70 MG/ML SOAJ: 70 | 30 days supply | Qty: 1 | Fill #0

## 2018-04-30 ENCOUNTER — Ambulatory Visit: Payer: 59 | Admitting: Family Medicine

## 2018-05-04 ENCOUNTER — Ambulatory Visit: Payer: 59 | Admitting: Family Medicine

## 2018-05-05 MED FILL — CYCLOBENZAPRINE 5 MG TABLET: 5 | 90 days supply | Qty: 90 | Fill #2

## 2018-05-05 MED FILL — SUMATRIPTAN SUCC 100 MG TAB: 100 | 30 days supply | Qty: 8 | Fill #5

## 2018-05-07 NOTE — Telephone Encounter (Signed)
Per patient she was not able to tolerate the Contrave so Dr. Madilyn Fireman approved the Saxenda. Pharmacy has been notified and voices understanding. Form sent to scan.

## 2018-05-07 NOTE — Addendum Note (Signed)
Addended by: Dessie Coma on: 05/07/2018 10:28 AM   Modules accepted: Orders

## 2018-05-11 ENCOUNTER — Ambulatory Visit: Payer: 59 | Admitting: Neurology

## 2018-05-18 MED FILL — AIMOVIG 70 MG/ML SOAJ: 70 | 30 days supply | Qty: 1 | Fill #1

## 2018-05-18 MED FILL — SAXENDA 18 MG/3 ML PEN: 18 | 30 days supply | Qty: 12 | Fill #0

## 2018-06-02 MED FILL — GABAPENTIN 100 MG CAPSULE: 100 | 30 days supply | Qty: 90 | Fill #2

## 2018-06-02 MED FILL — SUMATRIPTAN SUCC 100 MG TAB: 100 | 30 days supply | Qty: 8 | Fill #6

## 2018-06-16 MED FILL — AIMOVIG 70 MG/ML SOAJ: 70 | 30 days supply | Qty: 1 | Fill #2

## 2018-07-03 MED FILL — SUMAtriptan SUCCINATE 100 M: 100 | 30 days supply | Qty: 8 | Fill #7

## 2018-07-07 MED FILL — SHIPPING COST: 1 days supply | Qty: 1 | Fill #0

## 2018-07-07 MED FILL — NADOLOL 20 MG TAB: 20 | 90 days supply | Qty: 270 | Fill #3

## 2018-07-15 MED FILL — AIMOVIG 70 MG/ML SOAJ: 70 | 30 days supply | Qty: 1 | Fill #3

## 2018-07-15 MED FILL — SHIPPING COST: 1 days supply | Qty: 1 | Fill #1

## 2018-07-30 MED FILL — SUMAtriptan SUCCINATE 100 M: 100 | 30 days supply | Qty: 8 | Fill #8

## 2018-07-30 MED FILL — CYCLOBENZAPRINE HCL 5 MG TA: 5 | 90 days supply | Qty: 90 | Fill #3

## 2018-08-04 ENCOUNTER — Ambulatory Visit: Payer: 59 | Admitting: Family Medicine

## 2018-08-05 ENCOUNTER — Encounter: Payer: Self-pay | Admitting: Family Medicine

## 2018-08-05 ENCOUNTER — Ambulatory Visit (INDEPENDENT_AMBULATORY_CARE_PROVIDER_SITE_OTHER): Payer: 59 | Admitting: Family Medicine

## 2018-08-05 VITALS — BP 114/67 | HR 79 | Ht 66.0 in | Wt 215.0 lb

## 2018-08-05 DIAGNOSIS — Z23 Encounter for immunization: Secondary | ICD-10-CM | POA: Diagnosis not present

## 2018-08-05 DIAGNOSIS — R1032 Left lower quadrant pain: Secondary | ICD-10-CM

## 2018-08-05 DIAGNOSIS — N3289 Other specified disorders of bladder: Secondary | ICD-10-CM

## 2018-08-05 NOTE — Progress Notes (Signed)
Subjective:    Patient ID: Lisa Cannon, female    DOB: [birth year], 37 y.o.   MRN: [id number]  HPI 37 yo female comes in today c/o of bladder spasm and upset stomach since last Thursday ( 7 days).  Started Thursday morning while she was getting her haircut.  She was having to run to the bathroom in between she was having little to stomach pain and some diarrhea.  Then later that night she woke up in the middle the night and was having pretty significant left-sided lower abdominal pain that was radiating into her back.  She says after couple hours she was able to settle down.  At that time she was not actually having any bowel movements are loose stools.  Since then she just feels like she has had some discomfort there again mostly on the left side of her abdomen though a little bit all over.  She denies any blood in the urine or stool.  No dysuria.  No fevers chills or sweats.  She also feels like she is had some mild spasms in her bladder but nothing super painful.  She did not eat anything unusual the day before symptoms started she did have some popcorn at the movies that was at her normal routine.  No sick contacts.  No one else at home has been ill.  No vomiting.  She says both of her parents have had diverticulitis.  Did start Aimovig 2 months ago.   Review of Systems  BP 114/67   Pulse 79   Ht 5\' 6"  (1.676 m)   Wt 215 lb (97.5 kg)   SpO2 100%   BMI 34.70 kg/m     No Known Allergies  Past Medical History:  Diagnosis Date  . Abnormal Pap smear of cervix   . Migraines     Past Surgical History:  Procedure Laterality Date  . CERVICAL BIOPSY  W/ LOOP ELECTRODE EXCISION  2002  . LAPAROSCOPIC BILATERAL SALPINGECTOMY Bilateral 09/12/2015   Procedure: LAPAROSCOPIC BILATERAL SALPINGECTOMY;  Surgeon: Emily Filbert, MD;  Location: [facility];  Service: Gynecology;  Laterality: Bilateral;  . LAPAROSCOPIC LYSIS OF ADHESIONS  09/12/2015   Procedure: LAPAROSCOPIC LYSIS OF ADHESIONS;   Surgeon: Emily Filbert, MD;  Location: [facility];  Service: Gynecology;;  . TUBAL LIGATION    . WISDOM TOOTH EXTRACTION      Social History   Socioeconomic History  . Marital status: Married    Spouse name: Not on file  . Number of children: Not on file  . Years of education: Not on file  . Highest education level: Not on file  Occupational History  . Occupation: homemaker  Social Needs  . Financial resource strain: Not on file  . Food insecurity:    Worry: Not on file    Inability: Not on file  . Transportation needs:    Medical: Not on file    Non-medical: Not on file  Tobacco Use  . Smoking status: Never Smoker  . Smokeless tobacco: Never Used  Substance and Sexual Activity  . Alcohol use: No    Alcohol/week: 0.0 standard drinks  . Drug use: No  . Sexual activity: Yes    Partners: Male    Birth control/protection: Surgical  Lifestyle  . Physical activity:    Days per week: Not on file    Minutes per session: Not on file  . Stress: Not on file  Relationships  . Social connections:    Talks on  phone: Not on file    Gets together: Not on file    Attends religious service: Not on file    Active member of club or organization: Not on file    Attends meetings of clubs or organizations: Not on file    Relationship status: Not on file  . Intimate partner violence:    Fear of current or ex partner: Not on file    Emotionally abused: Not on file    Physically abused: Not on file    Forced sexual activity: Not on file  Other Topics Concern  . Not on file  Social History Narrative  . Not on file    Family History  Problem Relation Age of Onset  . Hypertension Maternal Grandfather   . Cancer Paternal Grandfather        lung cancer  . Cancer Paternal Grandmother        brain  . Diabetes Maternal Aunt   . Hypertension Maternal Aunt     Outpatient Encounter Medications as of 08/05/2018  Medication Sig  . cyclobenzaprine (FLEXERIL) 5 MG tablet Take 1 tablet daily as  needed for neck pain  . Erenumab-aooe (AIMOVIG) 70 MG/ML SOAJ Inject 1 Dose into the skin every 30 (thirty) days.  . fluticasone (FLONASE) 50 MCG/ACT nasal spray Place into both nostrils as needed for allergies or rhinitis.  Marland Kitchen gabapentin (NEURONTIN) 100 MG capsule Take 3 caps at night  . Multiple Vitamin (MULTIVITAMIN) capsule Take 1 capsule by mouth daily.  . nadolol (CORGARD) 20 MG tablet TAKE 3 TABLETS BY MOUTH DAILY  . naproxen sodium (ALEVE) 220 MG tablet Take 220 mg by mouth daily as needed (for pain).  . Probiotic Product (PROBIOTIC PO) Take by mouth.  . SUMAtriptan (IMITREX) 100 MG tablet TAKE 1 TABLET BY MOUTH AT ONSET OF HEADACHE. MAY REPEAT DOSE AFTER 2 HOURS. DO NOT TAKE MORE THAN 2 DAYS A WEEK.  . [DISCONTINUED] Liraglutide -Weight Management 18 MG/3ML SOPN Inject into the skin.   No facility-administered encounter medications on file as of 08/05/2018.          Objective:   Physical Exam  Constitutional: She is oriented to person, place, and time. She appears well-developed and well-nourished.  HENT:  Head: Normocephalic and atraumatic.  Cardiovascular: Normal rate, regular rhythm and normal heart sounds.  Pulmonary/Chest: Effort normal and breath sounds normal.  Abdominal: Bowel sounds are normal. She exhibits no distension and no mass. There is tenderness. There is no rebound and no guarding. No hernia.  Tender diffusely but mostly tender in the LLQ and suprapubic area   Neurological: She is alert and oriented to person, place, and time.  Skin: Skin is warm and dry.  Psychiatric: She has a normal mood and affect. Her behavior is normal.        Assessment & Plan:  Left lower quadrant pain/tenderness-no rebound guarding or fever.  We discussed options including full work-up with CT of the abdomen there were no red flag symptoms currently but still cannot completely rule out diverticulitis that she is on the younger end for that.  Could still be a viral colitis and/or  food poisoning though she did not eat anything out of the norm.  She actually is feeling a little bit better today so we discussed the option of just checking a CBC and watching it over the next 24 to 48 hours to see if she continues to improve on her own. INcrase fluids.

## 2018-08-05 NOTE — Patient Instructions (Signed)
Call if getting worse or if just not getting better in the next 48 hours  Drink plenty of water

## 2018-08-06 ENCOUNTER — Telehealth: Payer: Self-pay

## 2018-08-06 LAB — CBC WITH DIFFERENTIAL/PLATELET
BASOS ABS: 72 {cells}/uL (ref 0–200)
Basophils Relative: 1.1 %
EOS PCT: 1.5 %
Eosinophils Absolute: 98 cells/uL (ref 15–500)
HEMATOCRIT: 45.1 % — AB (ref 35.0–45.0)
HEMOGLOBIN: 15 g/dL (ref 11.7–15.5)
LYMPHS ABS: 1970 {cells}/uL (ref 850–3900)
MCH: 28.7 pg (ref 27.0–33.0)
MCHC: 33.3 g/dL (ref 32.0–36.0)
MCV: 86.2 fL (ref 80.0–100.0)
MPV: 11.2 fL (ref 7.5–12.5)
Monocytes Relative: 7.6 %
Neutro Abs: 3868 cells/uL (ref 1500–7800)
Neutrophils Relative %: 59.5 %
Platelets: 297 10*3/uL (ref 140–400)
RBC: 5.23 10*6/uL — ABNORMAL HIGH (ref 3.80–5.10)
RDW: 12.2 % (ref 11.0–15.0)
Total Lymphocyte: 30.3 %
WBC mixed population: 494 cells/uL (ref 200–950)
WBC: 6.5 10*3/uL (ref 3.8–10.8)

## 2018-08-06 LAB — URINALYSIS, ROUTINE W REFLEX MICROSCOPIC
Bilirubin Urine: NEGATIVE
GLUCOSE, UA: NEGATIVE
HGB URINE DIPSTICK: NEGATIVE
KETONES UR: NEGATIVE
Leukocytes, UA: NEGATIVE
NITRITE: NEGATIVE
PH: 6.5 (ref 5.0–8.0)
Protein, ur: NEGATIVE
Specific Gravity, Urine: 1.022 (ref 1.001–1.03)

## 2018-08-06 MED ORDER — AMOXICILLIN-POT CLAVULANATE 875-125 MG PO TABS: 1.0000 | ORAL_TABLET | Freq: Two times a day (BID) | ORAL | 0 refills | Status: DC

## 2018-08-06 MED FILL — AMOX-CLAV 875-125 MG TABLET: 875-125 | 7 days supply | Qty: 14 | Fill #0

## 2018-08-06 NOTE — Telephone Encounter (Signed)
New rx sent to the pharmacy for Augmentin.

## 2018-08-06 NOTE — Telephone Encounter (Signed)
Lisa Cannon called and states she still has back and left lower quadrant pain. Denies fever, chills or sweats. She states she was advised to call back if not better to get an antibiotic.

## 2018-08-06 NOTE — Telephone Encounter (Signed)
Patient advised.

## 2018-08-17 MED FILL — AIMOVIG 70 MG/ML SOAJ: 70 | 30 days supply | Qty: 1 | Fill #4

## 2018-09-01 MED FILL — SUMAtriptan SUCCINATE 100 M: 100 | 30 days supply | Qty: 8 | Fill #9

## 2018-09-14 MED FILL — GABAPENTIN 100 MG CAPSULE: 100 | 30 days supply | Qty: 90 | Fill #3

## 2018-09-14 MED FILL — AIMOVIG 70 MG/ML SOAJ: 70 | 30 days supply | Qty: 1 | Fill #5

## 2018-09-16 ENCOUNTER — Other Ambulatory Visit: Payer: Self-pay

## 2018-09-16 ENCOUNTER — Encounter: Payer: Self-pay | Admitting: Neurology

## 2018-09-16 ENCOUNTER — Ambulatory Visit: Payer: 59 | Admitting: Neurology

## 2018-09-16 VITALS — BP 130/74 | HR 83 | Ht 66.0 in | Wt 220.0 lb

## 2018-09-16 DIAGNOSIS — M542 Cervicalgia: Secondary | ICD-10-CM

## 2018-09-16 DIAGNOSIS — G43009 Migraine without aura, not intractable, without status migrainosus: Secondary | ICD-10-CM

## 2018-09-16 MED ORDER — SUMATRIPTAN SUCCINATE 100 MG PO TABS: ORAL_TABLET | ORAL | 3 refills | Status: DC

## 2018-09-16 MED ORDER — NARATRIPTAN HCL 1 MG PO TABS: ORAL_TABLET | ORAL | 11 refills | Status: DC

## 2018-09-16 MED ORDER — NADOLOL 20 MG PO TABS: ORAL_TABLET | ORAL | 3 refills | Status: DC

## 2018-09-16 MED ORDER — GABAPENTIN 100 MG PO CAPS: ORAL_CAPSULE | ORAL | 6 refills | Status: DC

## 2018-09-16 MED ORDER — ERENUMAB-AOOE 140 MG/ML ~~LOC~~ SOAJ: 1.0000 | SUBCUTANEOUS | 11 refills | Status: DC

## 2018-09-16 MED ORDER — CYCLOBENZAPRINE HCL 5 MG PO TABS: ORAL_TABLET | ORAL | 3 refills | Status: DC

## 2018-09-16 MED FILL — NARATRIPTAN HCL 1 MG TABLET: 1 | 17 days supply | Qty: 10 | Fill #0

## 2018-09-16 NOTE — Progress Notes (Signed)
NEUROLOGY FOLLOW UP OFFICE NOTE  Lisa Cannon [id number]  DOB: 24-Feb-1981  HISTORY OF PRESENT ILLNESS: I had the pleasure of seeing Lisa Cannon in follow-up in the neurology clinic on 09/16/2018.  The patient was last seen 5 months ago for worsening migraines. She was started on Aimovig on her last visit, and initially felt good migraine-free for 10 days, which has not happened in a long time. Since then, she feels it has knocked off a few migraines a month, but she continues to report 12 migraines a month, worse around the time of her menstrual period. She is coming of her cycle and still has some pain on the left occipital and left frontal regions. She took Imitrex yesterday. She had stopped the Frova around the time of her period because she did not feel it was helping. She continues to have neck pain, PT with dry needling did not help much with neck pain or migraines. She continues to take Nadolol 60mg  daily and gabapentin 100 mg qhs for migraine prevention. She had side effects on higher dose of gabapentin. She denies any vision changes, dizziness, focal numbness/tingling/weakness, no falls.   HPI: This is a very pleasant 37 yo RH woman with a history of migraines since her pre-teen years. This increased in frequency and severity when she was in college, with several migraines every week. Headaches are usually over the left frontal and retroorbital region, at times radiating to the left side of her neck. This is associated with nausea, dizziness, tightening in her jaw area, and mild photo/phonophobia. No migraine auras. She was initially started on Topamax which caused more headaches and nausea. Butalbital does not help. She was switched to Corgard (Nadolol) 5 years ago, which significantly helped reduce headaches to once a month, usually a day before she would start her menstrual period. She stopped the medication when she became pregnant with her daughter. Since then, she would have  1 migraine/month on average, with good response to Imitrex.Migraine triggers include lack of sleep and allergies. Her father and possibly paternal grandmother had migraines.   Prior Preventatives: Topamax, Zonisamide, nortriptyline Prior rescue medications: Butalbital, Aleve, Frova  PAST MEDICAL HISTORY: Past Medical History:  Diagnosis Date  . Abnormal Pap smear of cervix   . Migraines     MEDICATIONS: Current Outpatient Medications on File Prior to Visit  Medication Sig Dispense Refill  . amoxicillin-clavulanate (AUGMENTIN) 875-125 MG tablet Take 1 tablet by mouth 2 (two) times daily. 14 tablet 0  . cyclobenzaprine (FLEXERIL) 5 MG tablet Take 1 tablet daily as needed for neck pain 90 tablet 3  . Erenumab-aooe (AIMOVIG) 70 MG/ML SOAJ Inject 1 Dose into the skin every 30 (thirty) days. 1 pen 11  . fluticasone (FLONASE) 50 MCG/ACT nasal spray Place into both nostrils as needed for allergies or rhinitis.    Marland Kitchen gabapentin (NEURONTIN) 100 MG capsule Take 3 caps at night 90 capsule 6  . Multiple Vitamin (MULTIVITAMIN) capsule Take 1 capsule by mouth daily.    . nadolol (CORGARD) 20 MG tablet TAKE 3 TABLETS BY MOUTH DAILY 270 tablet 3  . naproxen sodium (ALEVE) 220 MG tablet Take 220 mg by mouth daily as needed (for pain).    . Probiotic Product (PROBIOTIC PO) Take by mouth.    . SUMAtriptan (IMITREX) 100 MG tablet TAKE 1 TABLET BY MOUTH AT ONSET OF HEADACHE. MAY REPEAT DOSE AFTER 2 HOURS. DO NOT TAKE MORE THAN 2 DAYS A WEEK. 30 tablet 3   No current  facility-administered medications on file prior to visit.     ALLERGIES: No Known Allergies  FAMILY HISTORY: Family History  Problem Relation Age of Onset  . Hypertension Maternal Grandfather   . Cancer Paternal Grandfather        lung cancer  . Cancer Paternal Grandmother        brain  . Diabetes Maternal Aunt   . Hypertension Maternal Aunt     SOCIAL HISTORY: Social History   Socioeconomic History  . Marital status: Married      Spouse name: Not on file  . Number of children: Not on file  . Years of education: Not on file  . Highest education level: Not on file  Occupational History  . Occupation: homemaker  Social Needs  . Financial resource strain: Not on file  . Food insecurity:    Worry: Not on file    Inability: Not on file  . Transportation needs:    Medical: Not on file    Non-medical: Not on file  Tobacco Use  . Smoking status: Never Smoker  . Smokeless tobacco: Never Used  Substance and Sexual Activity  . Alcohol use: No    Alcohol/week: 0.0 standard drinks  . Drug use: No  . Sexual activity: Yes    Partners: Male    Birth control/protection: Surgical  Lifestyle  . Physical activity:    Days per week: Not on file    Minutes per session: Not on file  . Stress: Not on file  Relationships  . Social connections:    Talks on phone: Not on file    Gets together: Not on file    Attends religious service: Not on file    Active member of club or organization: Not on file    Attends meetings of clubs or organizations: Not on file    Relationship status: Not on file  . Intimate partner violence:    Fear of current or ex partner: Not on file    Emotionally abused: Not on file    Physically abused: Not on file    Forced sexual activity: Not on file  Other Topics Concern  . Not on file  Social History Narrative  . Not on file    REVIEW OF SYSTEMS: Constitutional: No fevers, chills, or sweats, no generalized fatigue, change in appetite Eyes: No visual changes, double vision, eye pain Ear, nose and throat: No hearing loss, ear pain, nasal congestion, sore throat Cardiovascular: No chest pain, palpitations Respiratory:  No shortness of breath at rest or with exertion, wheezes GastrointestinaI: No nausea, vomiting, diarrhea, abdominal pain, fecal incontinence Genitourinary:  No dysuria, urinary retention or frequency Musculoskeletal:  + neck pain, no back pain Integumentary: No rash,  pruritus, skin lesions Neurological: as above Psychiatric: No depression, insomnia, anxiety Endocrine: No palpitations, fatigue, diaphoresis, mood swings, change in appetite, change in weight, increased thirst Hematologic/Lymphatic:  No anemia, purpura, petechiae. Allergic/Immunologic: no itchy/runny eyes, nasal congestion, recent allergic reactions, rashes  PHYSICAL EXAM: Vitals:   09/16/18 1551  BP: 130/74  Pulse: 83  SpO2: 98%   General: No acute distress Head:  Normocephalic/atraumatic Neck: supple, no paraspinal tenderness, full range of motion Heart:  Regular rate and rhythm Lungs:  Clear to auscultation bilaterally Back: No paraspinal tenderness Skin/Extremities: No rash, no edema Neurological Exam: alert and oriented to person, place, and time. No aphasia or dysarthria. Fund of knowledge is appropriate.  Recent and remote memory are intact.  Attention and concentration are normal.  Able to name objects and repeat phrases. Cranial nerves: Pupils equal, round, reactive to light.  Fundoscopic exam unremarkable, no papilledema. Extraocular movements intact with no nystagmus. Visual fields full. Facial sensation intact. No facial asymmetry. Tongue, uvula, palate midline.  Motor: Bulk and tone normal, muscle strength 5/5 throughout with no pronator drift.  Sensation to light touch intact.  No extinction to double simultaneous stimulation.  Finger to nose testing intact.  Gait narrow-based and steady, able to tandem walk adequately.  Romberg negative.  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 Headaches have improved with discontinuation of OCPs, however she continues to report 10-12 migraines a month on Nadolol 60mg  daily and gabapentin 100mg  qhs. There was brief improvement with initiation of Aimovig, but now she continues to report 12 migraines a month. We will try higher dose 140mg /mL. She will also try taking  naratriptan 1mg  BID around the time of her period for catamenial migraines. Continue all other medications, continue migraine calendar. She will follow-up in 5-6 months and knows to call for any changes.   Thank you for allowing me to participate in her care.  Please do not hesitate to call for any questions or concerns.  The duration of this appointment visit was 26 minutes of face-to-face time with the patient.  Greater than 50% of this time was spent in counseling, explanation of diagnosis, planning of further management, and coordination of care.   Ellouise Newer, M.D.   CC: Dr. Madilyn Fireman

## 2018-09-16 NOTE — Patient Instructions (Addendum)
1. Let's try the higher dose of Aimovig 140mg /dose every month 2. A prescription for naratriptan 1mg  twice a day for 4-5 days around your menstrual period has been sent 3. Continue all your other medications 4. Continue migraine calendar 5. Follow-up in 6 months, call for any changes

## 2018-10-05 MED FILL — SUMAtriptan SUCCINATE 100 M: 100 | 30 days supply | Qty: 9 | Fill #0

## 2018-10-05 MED FILL — NADOLOL 20 MG TAB: 20 | 90 days supply | Qty: 270 | Fill #0

## 2018-10-14 MED FILL — AIMOVIG 70 MG/ML SOAJ: 70 | 30 days supply | Qty: 1 | Fill #6

## 2018-10-27 NOTE — Progress Notes (Signed)
PA initiated via [email or web address] for pt's   Aimovig 70MG /ML auto-injectors

## 2018-11-02 MED FILL — SUMATRIPTAN SUCC 100 MG TAB: 100 | 30 days supply | Qty: 9 | Fill #1

## 2018-11-02 MED FILL — CYCLOBENZAPRINE HCL 5 MG TA: 5 | 90 days supply | Qty: 90 | Fill #0

## 2018-11-02 NOTE — Progress Notes (Signed)
Received notice from [employer] that pt's   Aimovig Autoinjector 70mg /mL has been   Approved 10/14/18 thru 10/14/2019

## 2018-11-12 MED FILL — AIMOVIG 70 MG/ML SOAJ: 70 | 30 days supply | Qty: 1 | Fill #7

## 2018-12-03 MED FILL — SUMATRIPTAN SUCC 100 MG TAB: 100 | 30 days supply | Qty: 9 | Fill #2

## 2018-12-10 MED FILL — GABAPENTIN 100 MG CAPSULE: 100 | 30 days supply | Qty: 90 | Fill #4

## 2018-12-11 MED FILL — NARATRIPTAN HCL 1 MG TABLET: 1 | 17 days supply | Qty: 10 | Fill #1

## 2018-12-11 MED FILL — AIMOVIG 70 MG/ML SOAJ: 70 | 30 days supply | Qty: 1 | Fill #8

## 2019-01-03 MED FILL — NADOLOL 20 MG TAB: 20 | 90 days supply | Qty: 270 | Fill #1

## 2019-01-03 MED FILL — SUMATRIPTAN SUCC 100 MG TAB: 100 | 30 days supply | Qty: 9 | Fill #3

## 2019-01-05 DIAGNOSIS — H6061 Unspecified chronic otitis externa, right ear: Secondary | ICD-10-CM | POA: Diagnosis not present

## 2019-01-05 DIAGNOSIS — H6691 Otitis media, unspecified, right ear: Secondary | ICD-10-CM | POA: Diagnosis not present

## 2019-01-05 DIAGNOSIS — H6121 Impacted cerumen, right ear: Secondary | ICD-10-CM | POA: Diagnosis not present

## 2019-01-05 MED FILL — CIPROFLOXACIN HCL 500 MG TA: 500 | 10 days supply | Qty: 20 | Fill #0

## 2019-01-05 MED FILL — NEO/POLYMYXIN/HC EAR SUSP: 3.5-10000-1 | 10 days supply | Qty: 10 | Fill #0

## 2019-01-11 MED FILL — AIMOVIG 70 MG/ML SOAJ: 70 | 30 days supply | Qty: 1 | Fill #9

## 2019-01-18 DIAGNOSIS — H6122 Impacted cerumen, left ear: Secondary | ICD-10-CM | POA: Diagnosis not present

## 2019-01-18 DIAGNOSIS — H6061 Unspecified chronic otitis externa, right ear: Secondary | ICD-10-CM | POA: Diagnosis not present

## 2019-01-27 MED FILL — CYCLOBENZAPRINE HCL 5 MG TA: 5 | 90 days supply | Qty: 90 | Fill #1

## 2019-01-28 MED FILL — SUMAtriptan SUCCINATE 100 M: 100 | 30 days supply | Qty: 9 | Fill #4

## 2019-02-02 ENCOUNTER — Telehealth: Payer: Self-pay

## 2019-02-02 ENCOUNTER — Telehealth: Payer: Self-pay | Admitting: Neurology

## 2019-02-02 DIAGNOSIS — H6121 Impacted cerumen, right ear: Secondary | ICD-10-CM | POA: Diagnosis not present

## 2019-02-02 DIAGNOSIS — J320 Chronic maxillary sinusitis: Secondary | ICD-10-CM | POA: Diagnosis not present

## 2019-02-02 DIAGNOSIS — H6061 Unspecified chronic otitis externa, right ear: Secondary | ICD-10-CM | POA: Diagnosis not present

## 2019-02-02 DIAGNOSIS — J322 Chronic ethmoidal sinusitis: Secondary | ICD-10-CM | POA: Diagnosis not present

## 2019-02-02 DIAGNOSIS — J040 Acute laryngitis: Secondary | ICD-10-CM | POA: Diagnosis not present

## 2019-02-02 DIAGNOSIS — H6691 Otitis media, unspecified, right ear: Secondary | ICD-10-CM | POA: Diagnosis not present

## 2019-02-02 MED FILL — CEFUROXIME AXETIL 250 MG TA: 250 | 10 days supply | Qty: 20 | Fill #0

## 2019-02-02 NOTE — Telephone Encounter (Signed)
Patient called and said that she has a Migraine an would like to be seen sooner. I have added her to the wait list. She is scheduled for June. Should she be seen sooner? Please Advise. Thanks

## 2019-02-02 NOTE — Telephone Encounter (Signed)
Lisa Cannon called and states she is having more migraines each month. She is unable to get in to see her Neurologist until June. Patient scheduled for Friday.

## 2019-02-03 ENCOUNTER — Encounter: Payer: Self-pay | Admitting: Family Medicine

## 2019-02-03 ENCOUNTER — Ambulatory Visit (INDEPENDENT_AMBULATORY_CARE_PROVIDER_SITE_OTHER): Payer: 59 | Admitting: Family Medicine

## 2019-02-03 VITALS — BP 138/83 | HR 90 | Ht 66.0 in | Wt 228.0 lb

## 2019-02-03 DIAGNOSIS — G43009 Migraine without aura, not intractable, without status migrainosus: Secondary | ICD-10-CM

## 2019-02-03 MED ORDER — GABAPENTIN 100 MG PO CAPS: ORAL_CAPSULE | ORAL | 6 refills | Status: DC

## 2019-02-03 NOTE — Progress Notes (Signed)
Acute Office Visit  Subjective:    Patient ID: Lisa Cannon, female    DOB: 1981/05/15, 38 y.o.   MRN: [id number]  Chief Complaint  Patient presents with  . Migraine    HPI Patient is in today for increase in migraine headaches.  Is like her headaches have ramped up significantly since Tuesday of last week.  She has been on higher dose of  Aimovig since November for prophylaxis, almost 5 months total and feels like it is not working.  She is having more migraines per month and unable to get back in neurology until June.  She has had significant headaches since in her late teens.  The only thing that really made a big difference was her nadolol and muscle relaxer.  She says that cut her migraines in half but she is never been able to get past that as far as improvement.  She has been following with Dr. Denzil Magnuson for about the last 5 to 6 years.  She said she tried to call and get in and was told she could not get an appointment until October even though she felt like it was more urgent because she was having persistent headaches.  She definitely feels like her headaches are much more frequent around the time of her menstrual cycle.  She has tried birth control in the past but says it actually worsened her migraines.  Prior Preventatives: Topamax, Zonisamide, nortriptyline. Aimovig, PHysical Therapy, Dry Needling Prior rescue medications: Butalbital, Aleve, Frova Imitrex works best.   Past Medical History:  Diagnosis Date  . Abnormal Pap smear of cervix   . Migraines     Past Surgical History:  Procedure Laterality Date  . CERVICAL BIOPSY  W/ LOOP ELECTRODE EXCISION  2002  . LAPAROSCOPIC BILATERAL SALPINGECTOMY Bilateral 09/12/2015   Procedure: LAPAROSCOPIC BILATERAL SALPINGECTOMY;  Surgeon: Emily Filbert, MD;  Location: [facility];  Service: Gynecology;  Laterality: Bilateral;  . LAPAROSCOPIC LYSIS OF ADHESIONS  09/12/2015   Procedure: LAPAROSCOPIC LYSIS OF ADHESIONS;  Surgeon: Emily Filbert,  MD;  Location: [facility];  Service: Gynecology;;  . TUBAL LIGATION    . WISDOM TOOTH EXTRACTION      Family History  Problem Relation Age of Onset  . Hypertension Maternal Grandfather   . Cancer Paternal Grandfather        lung cancer  . Cancer Paternal Grandmother        brain  . Diabetes Maternal Aunt   . Hypertension Maternal Aunt     Social History   Socioeconomic History  . Marital status: Married    Spouse name: Not on file  . Number of children: Not on file  . Years of education: Not on file  . Highest education level: Not on file  Occupational History  . Occupation: homemaker  Social Needs  . Financial resource strain: Not on file  . Food insecurity:    Worry: Not on file    Inability: Not on file  . Transportation needs:    Medical: Not on file    Non-medical: Not on file  Tobacco Use  . Smoking status: Never Smoker  . Smokeless tobacco: Never Used  Substance and Sexual Activity  . Alcohol use: No    Alcohol/week: 0.0 standard drinks  . Drug use: No  . Sexual activity: Yes    Partners: Male    Birth control/protection: Surgical  Lifestyle  . Physical activity:    Days per week: Not on file  Minutes per session: Not on file  . Stress: Not on file  Relationships  . Social connections:    Talks on phone: Not on file    Gets together: Not on file    Attends religious service: Not on file    Active member of club or organization: Not on file    Attends meetings of clubs or organizations: Not on file    Relationship status: Not on file  . Intimate partner violence:    Fear of current or ex partner: Not on file    Emotionally abused: Not on file    Physically abused: Not on file    Forced sexual activity: Not on file  Other Topics Concern  . Not on file  Social History Narrative  . Not on file    Outpatient Medications Prior to Visit  Medication Sig Dispense Refill  . cefUROXime (CEFTIN) 250 MG tablet     . cyclobenzaprine (FLEXERIL) 5 MG tablet  Take 1 tablet daily as needed for neck pain 90 tablet 3  . fluticasone (FLONASE) 50 MCG/ACT nasal spray Place into both nostrils as needed for allergies or rhinitis.    . Multiple Vitamin (MULTIVITAMIN) capsule Take 1 capsule by mouth daily.    . nadolol (CORGARD) 20 MG tablet TAKE 3 TABLETS BY MOUTH DAILY 270 tablet 3  . naproxen sodium (ALEVE) 220 MG tablet Take 220 mg by mouth daily as needed (for pain).    . naratriptan (AMERGE) 1 MG TABS tablet Take 1 tablet twice a day for 4-5 days around the time of your period 10 tablet 11  . Probiotic Product (PROBIOTIC PO) Take by mouth.    . SUMAtriptan (IMITREX) 100 MG tablet TAKE 1 TABLET BY MOUTH AT ONSET OF HEADACHE. MAY REPEAT DOSE AFTER 2 HOURS. DO NOT TAKE MORE THAN 2 DAYS A WEEK. 30 tablet 3  . Erenumab-aooe (AIMOVIG) 140 MG/ML SOAJ Inject 1 Dose into the skin every 30 (thirty) days. 1 pen 11  . gabapentin (NEURONTIN) 100 MG capsule Take 3 caps at night 90 capsule 6   No facility-administered medications prior to visit.     No Known Allergies  ROS     Objective:    Physical Exam  Constitutional: She is oriented to person, place, and time. She appears well-developed and well-nourished.  HENT:  Head: Normocephalic and atraumatic.  Eyes: Conjunctivae are normal.  Cardiovascular: Normal rate, regular rhythm and normal heart sounds.  Pulmonary/Chest: Effort normal and breath sounds normal.  Neurological: She is alert and oriented to person, place, and time. No cranial nerve deficit.  Skin: Skin is warm and dry.  Psychiatric: She has a normal mood and affect. Her behavior is normal.    BP 138/83   Pulse 90   Ht 5\' 6"  (1.676 m)   Wt 228 lb (103.4 kg)   SpO2 100%   BMI 36.80 kg/m  Wt Readings from Last 3 Encounters:  02/03/19 228 lb (103.4 kg)  09/16/18 220 lb (99.8 kg)  08/05/18 215 lb (97.5 kg)    There are no preventive care reminders to display for this patient.  There are no preventive care reminders to display for this  patient.   Lab Results  Component Value Date   TSH 1.44 05/03/2016   Lab Results  Component Value Date   WBC 6.5 08/05/2018   HGB 15.0 08/05/2018   HCT 45.1 (H) 08/05/2018   MCV 86.2 08/05/2018   PLT 297 08/05/2018   Lab Results  Component  Value Date   NA 139 04/02/2018   K 4.5 04/02/2018   CO2 27 04/02/2018   GLUCOSE 101 (H) 04/02/2018   BUN 12 04/02/2018   CREATININE 0.84 04/02/2018   BILITOT 0.8 04/02/2018   ALKPHOS 69 05/03/2016   AST 23 04/02/2018   ALT 34 (H) 04/02/2018   PROT 7.4 04/02/2018   ALBUMIN 4.2 05/03/2016   CALCIUM 9.6 04/02/2018   Lab Results  Component Value Date   CHOL 189 04/02/2018   Lab Results  Component Value Date   HDL 41 (L) 04/02/2018   Lab Results  Component Value Date   LDLCALC 125 (H) 04/02/2018   Lab Results  Component Value Date   TRIG 118 04/02/2018   Lab Results  Component Value Date   CHOLHDL 4.6 04/02/2018   Lab Results  Component Value Date   HGBA1C 5.6 04/02/2018       Assessment & Plan:   Problem List Items Addressed This Visit      Cardiovascular and Mediastinum   Migraine without aura and without status migrainosus, not intractable - Primary   Relevant Orders   Ambulatory referral to Neurology     Migraine headaches-we discussed some options.  She feels like the gabapentin has not been helpful and she is actually been on it for quite a long time.  Organ to go ahead and wean and discontinue that medication.  I am also going to try to get her in with a new neurologist so that she can get more timely appointments.  I do think she be a great candidate for Botox which is 1 of the few things she has not yet tried.  And we also discussed the possibility of trying another medication from the same class as Aimovig at some point.  We will also have her go ahead and discontinue the Aimovig.  Far as her acute migraine she declined any rescue drugs or prednisone today.  Meds ordered this encounter  Medications  .  DISCONTD: gabapentin (NEURONTIN) 100 MG capsule    Sig: Take 3 caps at night    Dispense:  90 capsule    Refill:  6     Beatrice Lecher, MD

## 2019-02-03 NOTE — Patient Instructions (Addendum)
Decrease gabapentin 100 mg to every other night for 8 days and then stop.   The Aimovig doesn't have to be taper so just skip the next dosing and the medication will eventully wear off.

## 2019-02-05 ENCOUNTER — Encounter: Payer: Self-pay | Admitting: Family Medicine

## 2019-02-10 NOTE — Telephone Encounter (Signed)
Just keep her on the waiting list.  Thanks!

## 2019-02-16 DIAGNOSIS — H6061 Unspecified chronic otitis externa, right ear: Secondary | ICD-10-CM | POA: Diagnosis not present

## 2019-02-17 ENCOUNTER — Encounter: Payer: Self-pay | Admitting: Neurology

## 2019-02-17 ENCOUNTER — Ambulatory Visit: Payer: Self-pay | Admitting: Neurology

## 2019-02-17 ENCOUNTER — Other Ambulatory Visit: Payer: Self-pay

## 2019-02-17 ENCOUNTER — Ambulatory Visit: Payer: 59 | Admitting: Neurology

## 2019-02-17 ENCOUNTER — Telehealth: Payer: Self-pay | Admitting: Neurology

## 2019-02-17 VITALS — BP 110/60 | HR 79 | Ht 66.0 in | Wt 224.5 lb

## 2019-02-17 DIAGNOSIS — Z6836 Body mass index (BMI) 36.0-36.9, adult: Secondary | ICD-10-CM | POA: Diagnosis not present

## 2019-02-17 DIAGNOSIS — M542 Cervicalgia: Secondary | ICD-10-CM

## 2019-02-17 DIAGNOSIS — G43709 Chronic migraine without aura, not intractable, without status migrainosus: Secondary | ICD-10-CM | POA: Diagnosis not present

## 2019-02-17 DIAGNOSIS — G43009 Migraine without aura, not intractable, without status migrainosus: Secondary | ICD-10-CM | POA: Diagnosis not present

## 2019-02-17 DIAGNOSIS — IMO0002 Reserved for concepts with insufficient information to code with codable children: Secondary | ICD-10-CM | POA: Insufficient documentation

## 2019-02-17 DIAGNOSIS — Z6835 Body mass index (BMI) 35.0-35.9, adult: Secondary | ICD-10-CM | POA: Insufficient documentation

## 2019-02-17 MED ORDER — TIZANIDINE HCL 4 MG PO TABS: 4.0000 mg | ORAL_TABLET | Freq: Three times a day (TID) | ORAL | 5 refills | Status: DC

## 2019-02-17 MED FILL — tiZANidine HCL 4 MG TABS: 4 | 30 days supply | Qty: 90 | Fill #0

## 2019-02-17 NOTE — Telephone Encounter (Signed)
Botox inj.

## 2019-02-17 NOTE — Progress Notes (Signed)
[facility]  PATIENT: Lisa Cannon DOB: Sep 26, 1981  REFERRING DOCTOR OR PCP:  Dr. Madilyn Fireman SOURCE: patient, notes from PCP, labs  _________________________________   HISTORICAL  CHIEF COMPLAINT:  Chief Complaint  Patient presents with  . New Patient (Initial Visit)    RM 12, alone. Internal referral from Dr. Madilyn Fireman for migraines. Most recently tried aimovig (migraine free for seven days after taking first inj, after that migraines came back). Has also tried gabapentin, verapamil, topamax, effexor and many others she cannot remember them all.     HISTORY OF PRESENT ILLNESS:  I had the pleasure seeing your patient, Lisa Cannon, a [facility] for neurologic consultation regarding her migraine headaches.  She is a 38 year old woman who has had migraines since age 53.   Initially, headaches were sporadic but they have become more chronic.     Currently, she has 16-20 migraine days a month for 4 or more hours a day.   She does not have aura.  Migraines are left frontal with a throbbing knifelike quality.   She often has pressure in her eyes and pain in the back of the head.   She sometimes has nausea and no vomiting.    She has photophobia and phonophobia.   During her cycle, HA's are daily and the rest of the month they are intermittent. Moving her head intensifies the pain.    Laying down does not help.     Imitrex will usually help for the rest of the day but then the HA returns.    She tried Aimovig. After the first dose she was migraine free x 7 days and then migraines returned.   A higher dose had not helped.     She was on gabapentin for over a year but had no benefit and actually did better when she stopped.    Topiramate was tried twice with no benefit and some side effects.   A combination of Corgard and Flexeril helped mildly (but only 20-25% reduction in frequency).   She has tried and failed propranolol.   Effexor or amitriptyline did not  help and the tricyclic caused side effects.    Aimovig had not helped.     She has mild seasonal allergies no other medical issues  REVIEW OF SYSTEMS: Constitutional: No fevers, chills, sweats, or change in appetite Eyes: No visual changes, double vision, eye pain Ear, nose and throat: No hearing loss, ear pain, nasal congestion, sore throat Cardiovascular: No chest pain, palpitations Respiratory: No shortness of breath at rest or with exertion.   No wheezes GastrointestinaI: No nausea, vomiting, diarrhea, abdominal pain, fecal incontinence Genitourinary: No dysuria, urinary retention or frequency.  No nocturia. Musculoskeletal: No neck pain, back pain Integumentary: No rash, pruritus, skin lesions Neurological: as above Psychiatric: No depression at this time.  No anxiety Endocrine: No palpitations, diaphoresis, change in appetite, change in weigh or increased thirst Hematologic/Lymphatic: No anemia, purpura, petechiae. Allergic/Immunologic: Has seasonal allergies  ALLERGIES: No Known Allergies  HOME MEDICATIONS:  Current Outpatient Medications:  .  cyclobenzaprine (FLEXERIL) 5 MG tablet, Take 1 tablet daily as needed for neck pain, Disp: 90 tablet, Rfl: 3 .  fluticasone (FLONASE) 50 MCG/ACT nasal spray, Place into both nostrils as needed for allergies or rhinitis., Disp: , Rfl:  .  Multiple Vitamin (MULTIVITAMIN) capsule, Take 1 capsule by mouth daily., Disp: , Rfl:  .  nadolol (CORGARD) 20 MG tablet, TAKE 3 TABLETS BY MOUTH DAILY, Disp: 270 tablet, Rfl:  3 .  naproxen sodium (ALEVE) 220 MG tablet, Take 220 mg by mouth daily as needed (for pain)., Disp: , Rfl:  .  Probiotic Product (PROBIOTIC PO), Take by mouth., Disp: , Rfl:  .  SUMAtriptan (IMITREX) 100 MG tablet, TAKE 1 TABLET BY MOUTH AT ONSET OF HEADACHE. MAY REPEAT DOSE AFTER 2 HOURS. DO NOT TAKE MORE THAN 2 DAYS A WEEK., Disp: 30 tablet, Rfl: 3 .  tiZANidine (ZANAFLEX) 4 MG tablet, Take 1 tablet (4 mg total) by mouth 3  (three) times daily., Disp: 90 tablet, Rfl: 5  PAST MEDICAL HISTORY: Past Medical History:  Diagnosis Date  . Abnormal Pap smear of cervix   . Migraines     PAST SURGICAL HISTORY: Past Surgical History:  Procedure Laterality Date  . CERVICAL BIOPSY  W/ LOOP ELECTRODE EXCISION  2002  . LAPAROSCOPIC BILATERAL SALPINGECTOMY Bilateral 09/12/2015   Procedure: LAPAROSCOPIC BILATERAL SALPINGECTOMY;  Surgeon: Emily Filbert, MD;  Location: [facility];  Service: Gynecology;  Laterality: Bilateral;  . LAPAROSCOPIC LYSIS OF ADHESIONS  09/12/2015   Procedure: LAPAROSCOPIC LYSIS OF ADHESIONS;  Surgeon: Emily Filbert, MD;  Location: [facility];  Service: Gynecology;;  . TUBAL LIGATION    . WISDOM TOOTH EXTRACTION      FAMILY HISTORY: Family History  Problem Relation Age of Onset  . Hypertension Maternal Grandfather   . Cancer Paternal Grandfather        lung cancer  . Cancer Paternal Grandmother        brain  . Diabetes Maternal Aunt   . Hypertension Maternal Aunt     SOCIAL HISTORY:  Social History   Socioeconomic History  . Marital status: Married    Spouse name: Not on file  . Number of children: 1  . Years of education: 17  . Highest education level: Not on file  Occupational History  . Occupation: homemaker  Social Needs  . Financial resource strain: Not on file  . Food insecurity:    Worry: Not on file    Inability: Not on file  . Transportation needs:    Medical: Not on file    Non-medical: Not on file  Tobacco Use  . Smoking status: Never Smoker  . Smokeless tobacco: Never Used  Substance and Sexual Activity  . Alcohol use: No    Alcohol/week: 0.0 standard drinks  . Drug use: No  . Sexual activity: Yes    Partners: Male    Birth control/protection: Surgical  Lifestyle  . Physical activity:    Days per week: Not on file    Minutes per session: Not on file  . Stress: Not on file  Relationships  . Social connections:    Talks on phone: Not on file    Gets together:  Not on file    Attends religious service: Not on file    Active member of club or organization: Not on file    Attends meetings of clubs or organizations: Not on file    Relationship status: Not on file  . Intimate partner violence:    Fear of current or ex partner: Not on file    Emotionally abused: Not on file    Physically abused: Not on file    Forced sexual activity: Not on file  Other Topics Concern  . Not on file  Social History Narrative   Lives with husband   Caffeine use: none   Right handed      PHYSICAL EXAM  Vitals:   02/17/19 0945  BP: 110/60  Pulse: 79  SpO2: 99%  Weight: 224 lb 8 oz (101.8 kg)  Height: 5\' 6"  (1.676 m)    Body mass index is 36.24 kg/m.   General: The patient is well-developed and well-nourished and in no acute distress  Eyes:  Funduscopic exam shows normal optic discs and retinal vessels.  Neck: The neck is supple, no carotid bruits are noted.  He has tenderness over the splenius capitis muscles greater than the splenius cervicis muscle, left greater than right.  Range of motion was good in the neck..  Cardiovascular: The heart has a regular rate and rhythm with a normal S1 and S2. There were no murmurs, gallops or rubs. Lungs are clear to auscultation.  Skin: Extremities are without significant edema.  Musculoskeletal:  Back is nontender  Neurologic Exam  Mental status: The patient is alert and oriented x 3 at the time of the examination. The patient has apparent normal recent and remote memory, with an apparently normal attention span and concentration ability.   Speech is normal.  Cranial nerves: Extraocular movements are full. Pupils are equal, round, and reactive to light and accomodation.  Visual fields are full.  Facial symmetry is present. There is good facial sensation to soft touch bilaterally.Facial strength is normal.  Trapezius and sternocleidomastoid strength is normal. No dysarthria is noted.  The tongue is midline, and  the patient has symmetric elevation of the soft palate. No obvious hearing deficits are noted.  Motor:  Muscle bulk is normal.   Tone is normal. Strength is  5 / 5 in all 4 extremities.   Sensory: Sensory testing is intact to pinprick, soft touch and vibration sensation in all 4 extremities.  Coordination: Cerebellar testing reveals good finger-nose-finger and heel-to-shin bilaterally.  Gait and station: Station is normal.   Gait is normal. Tandem gait is normal. Romberg is negative.   Reflexes: Deep tendon reflexes are symmetric and normal bilaterally.   Plantar responses are flexor.    DIAGNOSTIC DATA (LABS, IMAGING, TESTING) - I reviewed patient records, labs, notes, testing and imaging myself where available.  Lab Results  Component Value Date   WBC 6.5 08/05/2018   HGB 15.0 08/05/2018   HCT 45.1 (H) 08/05/2018   MCV 86.2 08/05/2018   PLT 297 08/05/2018      Component Value Date/Time   NA 139 04/02/2018 1003   K 4.5 04/02/2018 1003   CL 103 04/02/2018 1003   CO2 27 04/02/2018 1003   GLUCOSE 101 (H) 04/02/2018 1003   GLUCOSE 82 05/18/2012 1328   BUN 12 04/02/2018 1003   CREATININE 0.84 04/02/2018 1003   CALCIUM 9.6 04/02/2018 1003   PROT 7.4 04/02/2018 1003   ALBUMIN 4.2 05/03/2016 0808   AST 23 04/02/2018 1003   ALT 34 (H) 04/02/2018 1003   ALKPHOS 69 05/03/2016 0808   BILITOT 0.8 04/02/2018 1003   GFRNONAA 90 04/02/2018 1003   GFRAA 104 04/02/2018 1003   Lab Results  Component Value Date   CHOL 189 04/02/2018   HDL 41 (L) 04/02/2018   LDLCALC 125 (H) 04/02/2018   TRIG 118 04/02/2018   CHOLHDL 4.6 04/02/2018   Lab Results  Component Value Date   HGBA1C 5.6 04/02/2018   No results found for: VITAMINB12 Lab Results  Component Value Date   TSH 1.44 05/03/2016       ASSESSMENT AND PLAN  Neck pain  Chronic migraine  Migraine without aura and without status migrainosus, not intractable  BMI 36.0-36.9,adult  In summary, Ms. Benedetti is a  38 year old woman who has had chronic migraine headaches for many years that have not responded adequately to multiple medications including beta-blockers, muscle relaxants, anti-epilepsy medication and more recently anti-CGRP monoclonal antibodies.  Due to failure with medications from multiple categories, Botox might be her best option.  We will see we can get her authorized for this medication.  In the interim, since she also has neck pain I will have her try tizanidine.  She will try to titrate up to 4 mg 3 times daily.  She will continue taking triptan's as needed.  I will see her back for the injections.  She should call sooner if she has significant new or worsening neurologic symptoms.  Thank you for asking me to see Ms. Foister.  Please let me know if I can be of further assistance with her or other patients in the future.    Tremel Setters A. Felecia Shelling, MD, [facility] 12/10/3233, 57:32 AM Certified in Neurology, Clinical Neurophysiology, Sleep Medicine, Pain Medicine and Neuroimaging  [facility] [address] [place], [place] [address] [phone]

## 2019-02-17 NOTE — Telephone Encounter (Signed)
Brought botox consent form to Dana/Emily since Andee Poles is out. Request for: Botox 200U, dx code: L27.871.

## 2019-02-19 ENCOUNTER — Other Ambulatory Visit: Payer: Self-pay

## 2019-02-19 ENCOUNTER — Ambulatory Visit: Payer: 59

## 2019-02-19 ENCOUNTER — Ambulatory Visit: Payer: 59 | Admitting: Family Medicine

## 2019-02-19 ENCOUNTER — Encounter: Payer: Self-pay | Admitting: Family Medicine

## 2019-02-19 ENCOUNTER — Other Ambulatory Visit: Payer: Self-pay | Admitting: Family Medicine

## 2019-02-19 VITALS — BP 124/71 | HR 81 | Ht 66.0 in | Wt 224.0 lb

## 2019-02-19 DIAGNOSIS — M79661 Pain in right lower leg: Secondary | ICD-10-CM

## 2019-02-19 DIAGNOSIS — M79672 Pain in left foot: Secondary | ICD-10-CM | POA: Diagnosis not present

## 2019-02-19 DIAGNOSIS — R600 Localized edema: Secondary | ICD-10-CM | POA: Diagnosis not present

## 2019-02-19 DIAGNOSIS — M79662 Pain in left lower leg: Secondary | ICD-10-CM

## 2019-02-19 NOTE — Progress Notes (Signed)
Acute Office Visit  Subjective:    Patient ID: Lisa Cannon, female    DOB: Apr 28, 1981, 37 y.o.   MRN: [id number]  Chief Complaint  Patient presents with  . Leg Pain    L legx 3wks below calf she describes this as being intense, achy, sore and throbbing especially after walking. yesterday she reports that her pain was the worst it was 4/10 she    HPI Patient is in today for Left leg pain x 2+  weeks.  She says her pain got worse yesterday yesterday. Pain is below her left calf. Intense, achey and sore after walking. Pain 4/10 today.   Has tried massage and heat and not really helping. Denies any injury or trauma. Went to [employer] in Feb and did a lot of walking.  She said she would have a lot of soreness in both legs and calves and feet at the end of the day but did not really notice any persistent discomfort until a couple of weeks ago.  She says yesterday she was walking around and doing a lot of yard and by the end of the day it was just really painful to walk at all.  Also over the last couple of weeks she has had some pain at the anterior base of the left heel.  She is not on any birth control or hormone therapy.  Past Medical History:  Diagnosis Date  . Abnormal Pap smear of cervix   . Migraines     Past Surgical History:  Procedure Laterality Date  . CERVICAL BIOPSY  W/ LOOP ELECTRODE EXCISION  2002  . LAPAROSCOPIC BILATERAL SALPINGECTOMY Bilateral 09/12/2015   Procedure: LAPAROSCOPIC BILATERAL SALPINGECTOMY;  Surgeon: Emily Filbert, MD;  Location: [facility];  Service: Gynecology;  Laterality: Bilateral;  . LAPAROSCOPIC LYSIS OF ADHESIONS  09/12/2015   Procedure: LAPAROSCOPIC LYSIS OF ADHESIONS;  Surgeon: Emily Filbert, MD;  Location: [facility];  Service: Gynecology;;  . TUBAL LIGATION    . WISDOM TOOTH EXTRACTION      Family History  Problem Relation Age of Onset  . Hypertension Maternal Grandfather   . Cancer Paternal Grandfather        lung cancer  . Cancer Paternal Grandmother         brain  . Diabetes Maternal Aunt   . Hypertension Maternal Aunt     Social History   Socioeconomic History  . Marital status: Married    Spouse name: Not on file  . Number of children: 1  . Years of education: 92  . Highest education level: Not on file  Occupational History  . Occupation: homemaker  Social Needs  . Financial resource strain: Not on file  . Food insecurity:    Worry: Not on file    Inability: Not on file  . Transportation needs:    Medical: Not on file    Non-medical: Not on file  Tobacco Use  . Smoking status: Never Smoker  . Smokeless tobacco: Never Used  Substance and Sexual Activity  . Alcohol use: No    Alcohol/week: 0.0 standard drinks  . Drug use: No  . Sexual activity: Yes    Partners: Male    Birth control/protection: Surgical  Lifestyle  . Physical activity:    Days per week: Not on file    Minutes per session: Not on file  . Stress: Not on file  Relationships  . Social connections:    Talks on phone: Not on file  Gets together: Not on file    Attends religious service: Not on file    Active member of club or organization: Not on file    Attends meetings of clubs or organizations: Not on file    Relationship status: Not on file  . Intimate partner violence:    Fear of current or ex partner: Not on file    Emotionally abused: Not on file    Physically abused: Not on file    Forced sexual activity: Not on file  Other Topics Concern  . Not on file  Social History Narrative   Lives with husband   Caffeine use: none   Right handed     Outpatient Medications Prior to Visit  Medication Sig Dispense Refill  . cyclobenzaprine (FLEXERIL) 5 MG tablet Take 1 tablet daily as needed for neck pain 90 tablet 3  . fluticasone (FLONASE) 50 MCG/ACT nasal spray Place into both nostrils as needed for allergies or rhinitis.    . Multiple Vitamin (MULTIVITAMIN) capsule Take 1 capsule by mouth daily.    . nadolol (CORGARD) 20 MG tablet TAKE 3  TABLETS BY MOUTH DAILY 270 tablet 3  . naproxen sodium (ALEVE) 220 MG tablet Take 220 mg by mouth daily as needed (for pain).    . Probiotic Product (PROBIOTIC PO) Take by mouth.    . SUMAtriptan (IMITREX) 100 MG tablet TAKE 1 TABLET BY MOUTH AT ONSET OF HEADACHE. MAY REPEAT DOSE AFTER 2 HOURS. DO NOT TAKE MORE THAN 2 DAYS A WEEK. 30 tablet 3  . tiZANidine (ZANAFLEX) 4 MG tablet Take 1 tablet (4 mg total) by mouth 3 (three) times daily. 90 tablet 5   No facility-administered medications prior to visit.     No Known Allergies  ROS     Objective:    Physical Exam  Constitutional: She is oriented to person, place, and time. She appears well-developed and well-nourished.  HENT:  Head: Normocephalic and atraumatic.  Eyes: Conjunctivae and EOM are normal.  Cardiovascular: Normal rate.  Pulmonary/Chest: Effort normal.  Musculoskeletal:     Comments: Tender at the base of the the left medial calf.  Some discomfort over the calf with dorsiflexion of the foot.  Some mild swelling but not erythema.  Left ankle with normal range of motion.  Just mildly tender at the base of the heel.  Dorsal pedal pulse 2+.  Neurological: She is alert and oriented to person, place, and time.  Skin: Skin is dry. No pallor.  Psychiatric: She has a normal mood and affect. Her behavior is normal.  Vitals reviewed.   BP 124/71   Pulse 81   Ht 5\' 6"  (1.676 m)   Wt 224 lb (101.6 kg)   SpO2 100%   BMI 36.15 kg/m  Wt Readings from Last 3 Encounters:  02/19/19 224 lb (101.6 kg)  02/17/19 224 lb 8 oz (101.8 kg)  02/03/19 228 lb (103.4 kg)    There are no preventive care reminders to display for this patient.  There are no preventive care reminders to display for this patient.   Lab Results  Component Value Date   TSH 1.44 05/03/2016   Lab Results  Component Value Date   WBC 6.5 08/05/2018   HGB 15.0 08/05/2018   HCT 45.1 (H) 08/05/2018   MCV 86.2 08/05/2018   PLT 297 08/05/2018   Lab Results   Component Value Date   NA 139 04/02/2018   K 4.5 04/02/2018   CO2 27 04/02/2018  GLUCOSE 101 (H) 04/02/2018   BUN 12 04/02/2018   CREATININE 0.84 04/02/2018   BILITOT 0.8 04/02/2018   ALKPHOS 69 05/03/2016   AST 23 04/02/2018   ALT 34 (H) 04/02/2018   PROT 7.4 04/02/2018   ALBUMIN 4.2 05/03/2016   CALCIUM 9.6 04/02/2018   Lab Results  Component Value Date   CHOL 189 04/02/2018   Lab Results  Component Value Date   HDL 41 (L) 04/02/2018   Lab Results  Component Value Date   LDLCALC 125 (H) 04/02/2018   Lab Results  Component Value Date   TRIG 118 04/02/2018   Lab Results  Component Value Date   CHOLHDL 4.6 04/02/2018   Lab Results  Component Value Date   HGBA1C 5.6 04/02/2018       Assessment & Plan:   Problem List Items Addressed This Visit    None    Visit Diagnoses    Pain of left heel    -  Primary   Right calf pain         Left calf pain-I am concerned about possible DVT.  She did travel with a long car ride to [place] about 2 weeks before her pain started.  She is not on any type of hormone therapy.  And has never had any prior DVT that I am aware of.  We will get scheduled for stat ultrasound.  Left heel pain-suspect plantar fasciitis based on her description of the pain and a normal exam today.  Given handout on some stretches to do on her own recommend icing it.  Not improving over the next couple weeks then please let us know.  No orders of the defined types were placed in this encounter.    Beatrice Lecher, MD

## 2019-02-22 NOTE — Telephone Encounter (Signed)
Pt returned Danielle's call °

## 2019-02-22 NOTE — Telephone Encounter (Signed)
I called the patient to schedule but she did not answer so I left a VM asking her to call me back. DW  °

## 2019-02-23 NOTE — Telephone Encounter (Signed)
Patient was scheduled by Jael.

## 2019-03-03 ENCOUNTER — Encounter: Payer: 59 | Admitting: Family Medicine

## 2019-03-05 MED FILL — SUMATRIPTAN SUCC 100 MG TAB: 100 | 30 days supply | Qty: 9 | Fill #5

## 2019-03-10 MED FILL — AMOX-CLAV 875-125 MG TABLET: 875-125 | 7 days supply | Qty: 14 | Fill #0

## 2019-03-11 ENCOUNTER — Telehealth: Payer: Self-pay | Admitting: *Deleted

## 2019-03-11 NOTE — Telephone Encounter (Signed)
Pt called back and accepted appt on 03/18/19 at 9am instead. Informed Julian Reil. She is working on her botox/shipment.

## 2019-03-11 NOTE — Telephone Encounter (Signed)
Called, LVM for pt to call back about her appt next week on 03/19/19 for her botox. due to current COVID-19 pandemic, our office is severely reducing in office visits in order to minimize the risk to our patients and healthcare providers. We are closed on Friday's right now to limit exposure.  Advised as long as she has no sx of covid-19 or been exposed to any one dx, she is welcome to still come in for botox.  Can offer 03/18/19 at 9am with Dr. Felecia Shelling instead if she calls. I placed appt on hold

## 2019-03-18 ENCOUNTER — Encounter: Payer: Self-pay | Admitting: Neurology

## 2019-03-18 ENCOUNTER — Other Ambulatory Visit: Payer: Self-pay

## 2019-03-18 ENCOUNTER — Ambulatory Visit (INDEPENDENT_AMBULATORY_CARE_PROVIDER_SITE_OTHER): Payer: 59 | Admitting: Neurology

## 2019-03-18 VITALS — BP 141/91 | HR 85 | Ht 66.0 in

## 2019-03-18 DIAGNOSIS — M542 Cervicalgia: Secondary | ICD-10-CM

## 2019-03-18 DIAGNOSIS — G43709 Chronic migraine without aura, not intractable, without status migrainosus: Secondary | ICD-10-CM | POA: Diagnosis not present

## 2019-03-18 DIAGNOSIS — IMO0002 Reserved for concepts with insufficient information to code with codable children: Secondary | ICD-10-CM

## 2019-03-18 NOTE — Progress Notes (Signed)
[facility]  PATIENT: Lisa Cannon DOB: 1981-03-20  REFERRING DOCTOR OR PCP:  Dr. Madilyn Fireman SOURCE: patient, notes from PCP, labs  _________________________________   HISTORICAL  CHIEF COMPLAINT:  Chief Complaint  Patient presents with  . Botulinum Toxin Injection    RM 13, alone. B/B. 200U (2-100U vials). Lot: [id number]. Expiration: 07/2021. NDC: [id number]    HISTORY OF PRESENT ILLNESS:  Lisa Cannon is a 38 yo woman with chronic  migraine headaches.  Update 03/18/2019: Since her last visit, she tried tizanidine.  Unfortunately, even low doses were poorly tolerated due to nausea and reduced balance.  She has failed multiple preventative medications for migraines most recently Aimovig.   She is currently experiencing about 20-24 migraines a month and they last for most of the day.  They are predominantly in the left frontal and temporal region though sometimes she also gets some neck pain.  There is often pressure in the eyes or pain in the back of the head.  She has nausea but rarely has vomiting.  There is photophobia and phonophobia and moving will worsen the headache.  Lying down does not help much.  Imitrex will help for about 1/2-day.  We went over the risks and benefits of Botox therapy.  From 02/17/2019: She is a 38 year old woman who has had migraines since age 21.   Initially, headaches were sporadic but they have become more chronic.     Currently, she has 16-20 migraine days a month for 4 or more hours a day.   She does not have aura.  Migraines are left frontal with a throbbing knifelike quality.   She often has pressure in her eyes and pain in the back of the head.   She sometimes has nausea and no vomiting.    She has photophobia and phonophobia.   During her cycle, HA's are daily and the rest of the month they are intermittent. Moving her head intensifies the pain.    Laying down does not help.     Imitrex will usually help for the rest of the day but  then the HA returns.    She tried Aimovig. After the first dose she was migraine free x 7 days and then migraines returned.   A higher dose had not helped.     She was on gabapentin for over a year but had no benefit and actually did better when she stopped.    Topiramate was tried twice with no benefit and some side effects.   A combination of Corgard and Flexeril helped mildly (but only 20-25% reduction in frequency).   She has tried and failed propranolol.   Effexor or amitriptyline did not help and the tricyclic caused side effects.    Aimovig had not helped.     She has mild seasonal allergies no other medical issues  REVIEW OF SYSTEMS: Constitutional: No fevers, chills, sweats, or change in appetite Eyes: No visual changes, double vision, eye pain Ear, nose and throat: No hearing loss, ear pain, nasal congestion, sore throat Cardiovascular: No chest pain, palpitations Respiratory: No shortness of breath at rest or with exertion.   No wheezes GastrointestinaI: No nausea, vomiting, diarrhea, abdominal pain, fecal incontinence Genitourinary: No dysuria, urinary retention or frequency.  No nocturia. Musculoskeletal: No neck pain, back pain Integumentary: No rash, pruritus, skin lesions Neurological: as above Psychiatric: No depression at this time.  No anxiety Endocrine: No palpitations, diaphoresis, change in appetite, change in weigh or increased thirst Hematologic/Lymphatic:  No anemia, purpura, petechiae. Allergic/Immunologic: Has seasonal allergies  ALLERGIES: No Known Allergies  HOME MEDICATIONS:  Current Outpatient Medications:  .  cyclobenzaprine (FLEXERIL) 5 MG tablet, Take 1 tablet daily as needed for neck pain, Disp: 90 tablet, Rfl: 3 .  fluticasone (FLONASE) 50 MCG/ACT nasal spray, Place into both nostrils as needed for allergies or rhinitis., Disp: , Rfl:  .  Multiple Vitamin (MULTIVITAMIN) capsule, Take 1 capsule by mouth daily., Disp: , Rfl:  .  nadolol (CORGARD) 20  MG tablet, TAKE 3 TABLETS BY MOUTH DAILY, Disp: 270 tablet, Rfl: 3 .  naproxen sodium (ALEVE) 220 MG tablet, Take 220 mg by mouth daily as needed (for pain)., Disp: , Rfl:  .  Probiotic Product (PROBIOTIC PO), Take by mouth., Disp: , Rfl:  .  SUMAtriptan (IMITREX) 100 MG tablet, TAKE 1 TABLET BY MOUTH AT ONSET OF HEADACHE. MAY REPEAT DOSE AFTER 2 HOURS. DO NOT TAKE MORE THAN 2 DAYS A WEEK., Disp: 30 tablet, Rfl: 3 .  tiZANidine (ZANAFLEX) 4 MG tablet, Take 1 tablet (4 mg total) by mouth 3 (three) times daily., Disp: 90 tablet, Rfl: 5  PAST MEDICAL HISTORY: Past Medical History:  Diagnosis Date  . Abnormal Pap smear of cervix   . Migraines     PAST SURGICAL HISTORY: Past Surgical History:  Procedure Laterality Date  . CERVICAL BIOPSY  W/ LOOP ELECTRODE EXCISION  2002  . LAPAROSCOPIC BILATERAL SALPINGECTOMY Bilateral 09/12/2015   Procedure: LAPAROSCOPIC BILATERAL SALPINGECTOMY;  Surgeon: Emily Filbert, MD;  Location: [facility];  Service: Gynecology;  Laterality: Bilateral;  . LAPAROSCOPIC LYSIS OF ADHESIONS  09/12/2015   Procedure: LAPAROSCOPIC LYSIS OF ADHESIONS;  Surgeon: Emily Filbert, MD;  Location: [facility];  Service: Gynecology;;  . TUBAL LIGATION    . WISDOM TOOTH EXTRACTION      FAMILY HISTORY: Family History  Problem Relation Age of Onset  . Hypertension Maternal Grandfather   . Cancer Paternal Grandfather        lung cancer  . Cancer Paternal Grandmother        brain  . Diabetes Maternal Aunt   . Hypertension Maternal Aunt     SOCIAL HISTORY:  Social History   Socioeconomic History  . Marital status: Married    Spouse name: Not on file  . Number of children: 1  . Years of education: 20  . Highest education level: Not on file  Occupational History  . Occupation: homemaker  Social Needs  . Financial resource strain: Not on file  . Food insecurity:    Worry: Not on file    Inability: Not on file  . Transportation needs:    Medical: Not on file    Non-medical: Not  on file  Tobacco Use  . Smoking status: Never Smoker  . Smokeless tobacco: Never Used  Substance and Sexual Activity  . Alcohol use: No    Alcohol/week: 0.0 standard drinks  . Drug use: No  . Sexual activity: Yes    Partners: Male    Birth control/protection: Surgical  Lifestyle  . Physical activity:    Days per week: Not on file    Minutes per session: Not on file  . Stress: Not on file  Relationships  . Social connections:    Talks on phone: Not on file    Gets together: Not on file    Attends religious service: Not on file    Active member of club or organization: Not on file    Attends meetings  of clubs or organizations: Not on file    Relationship status: Not on file  . Intimate partner violence:    Fear of current or ex partner: Not on file    Emotionally abused: Not on file    Physically abused: Not on file    Forced sexual activity: Not on file  Other Topics Concern  . Not on file  Social History Narrative   Lives with husband   Caffeine use: none   Right handed      PHYSICAL EXAM  Vitals:   03/18/19 0852  BP: (!) 141/91  Pulse: 85  Height: 5\' 6"  (1.676 m)    Body mass index is 36.15 kg/m.   General: The patient is well-developed and well-nourished and in no acute distress  Neck: Range of motion is normal in the neck.  She currently does not have much tenderness in the splenius capitis or paraspinal muscles.  Neurologic Exam  Mental status: The patient is alert and oriented x 3 at the time of the examination. The patient has apparent normal recent and remote memory, with an apparently normal attention span and concentration ability.   Speech is normal.  Cranial nerves: Extraocular movements are full.  Facial strength and sensation is normal.  The tongue is midline, and the patient has symmetric elevation of the soft palate. No obvious hearing deficits are noted.  Motor:  Muscle bulk is normal.   Strength is normal in the arms  Coordination: She  has good finger-nose-finger bilaterally.  Gait and station: Station is normal.   Gait is normal. Tandem gait is normal. Romberg is negative.     DIAGNOSTIC DATA (LABS, IMAGING, TESTING) - I reviewed patient records, labs, notes, testing and imaging myself where available.  Lab Results  Component Value Date   WBC 6.5 08/05/2018   HGB 15.0 08/05/2018   HCT 45.1 (H) 08/05/2018   MCV 86.2 08/05/2018   PLT 297 08/05/2018      Component Value Date/Time   NA 139 04/02/2018 1003   K 4.5 04/02/2018 1003   CL 103 04/02/2018 1003   CO2 27 04/02/2018 1003   GLUCOSE 101 (H) 04/02/2018 1003   GLUCOSE 82 05/18/2012 1328   BUN 12 04/02/2018 1003   CREATININE 0.84 04/02/2018 1003   CALCIUM 9.6 04/02/2018 1003   PROT 7.4 04/02/2018 1003   ALBUMIN 4.2 05/03/2016 0808   AST 23 04/02/2018 1003   ALT 34 (H) 04/02/2018 1003   ALKPHOS 69 05/03/2016 0808   BILITOT 0.8 04/02/2018 1003   GFRNONAA 90 04/02/2018 1003   GFRAA 104 04/02/2018 1003   Lab Results  Component Value Date   CHOL 189 04/02/2018   HDL 41 (L) 04/02/2018   LDLCALC 125 (H) 04/02/2018   TRIG 118 04/02/2018   CHOLHDL 4.6 04/02/2018   Lab Results  Component Value Date   HGBA1C 5.6 04/02/2018   No results found for: [id number] Lab Results  Component Value Date   TSH 1.44 05/03/2016       ASSESSMENT AND PLAN  Chronic migraine  Neck pain    1.   Botox 175 units was injected as follows:   Corrugators and procerus (5 units x 3), frontalis (5 units x 4), temporalis (5 units x 5 on the left and 5 units x 4 on the right), occipitalis (5 units on the left x3 and on the right x2), splenius capitis 12.5 units x 2, trapezius 12.5 units x 2, C3-C4 paraspinal muscles 10 units x2.  Wasted  25 units 2.    We discussed that Botox generally takes 3 to 7 days to become effective.  If she has no benefit at all consider a switch to Vyepti (eptinezumab) injection. 3.    Stay active and exercise as tolerated. 4..   Return in 3 months,  sooner if new or worsening neurologic issues   A. Felecia Shelling, MD, [facility] 5/69/7948, 0:16 AM Certified in Neurology, Clinical Neurophysiology, Sleep Medicine, Pain Medicine and Neuroimaging  [facility] [address] [place], [place] [address] [phone]

## 2019-03-19 ENCOUNTER — Ambulatory Visit: Payer: 59 | Admitting: Neurology

## 2019-04-05 MED FILL — NADOLOL 20 MG TAB: 20 | 90 days supply | Qty: 270 | Fill #2

## 2019-04-05 MED FILL — SUMATRIPTAN SUCC 100 MG TAB: 100 | 30 days supply | Qty: 9 | Fill #6

## 2019-04-29 ENCOUNTER — Encounter: Payer: 59 | Admitting: Family Medicine

## 2019-04-30 ENCOUNTER — Ambulatory Visit (INDEPENDENT_AMBULATORY_CARE_PROVIDER_SITE_OTHER): Payer: 59 | Admitting: Family Medicine

## 2019-04-30 ENCOUNTER — Encounter: Payer: Self-pay | Admitting: Family Medicine

## 2019-04-30 VITALS — BP 125/70 | HR 85 | Ht 66.0 in | Wt 221.0 lb

## 2019-04-30 DIAGNOSIS — Z0000 Encounter for general adult medical examination without abnormal findings: Secondary | ICD-10-CM | POA: Diagnosis not present

## 2019-04-30 NOTE — Patient Instructions (Signed)

## 2019-04-30 NOTE — Progress Notes (Signed)
Subjective:     Lisa Cannon is a 38 y.o. female and is here for a comprehensive physical exam. The patient reports no problems.  She is doing fairly well overall she is been struggling with some plantar fasciitis.  So has not been able to exercise more recently.  She would like to work on losing a little bit more weight.  Is been staying at home with her daughter Lisa Cannon during [address] and helping her with a learning.  Recently she had Botox injections for her migraines unfortunately it was not very helpful.  Social History   Socioeconomic History  . Marital status: Married    Spouse name: Not on file  . Number of children: 1  . Years of education: 74  . Highest education level: Not on file  Occupational History  . Occupation: homemaker  Social Needs  . Financial resource strain: Not on file  . Food insecurity:    Worry: Not on file    Inability: Not on file  . Transportation needs:    Medical: Not on file    Non-medical: Not on file  Tobacco Use  . Smoking status: Never Smoker  . Smokeless tobacco: Never Used  Substance and Sexual Activity  . Alcohol use: No    Alcohol/week: 0.0 standard drinks  . Drug use: No  . Sexual activity: Yes    Partners: Male    Birth control/protection: Surgical  Lifestyle  . Physical activity:    Days per week: Not on file    Minutes per session: Not on file  . Stress: Not on file  Relationships  . Social connections:    Talks on phone: Not on file    Gets together: Not on file    Attends religious service: Not on file    Active member of club or organization: Not on file    Attends meetings of clubs or organizations: Not on file    Relationship status: Not on file  . Intimate partner violence:    Fear of current or ex partner: Not on file    Emotionally abused: Not on file    Physically abused: Not on file    Forced sexual activity: Not on file  Other Topics Concern  . Not on file  Social History Narrative   Lives with husband   Caffeine use: none   Right handed    Health Maintenance  Topic Date Due  . INFLUENZA VACCINE  07/03/2019  . PAP SMEAR-Modifier  12/10/2020  . TETANUS/TDAP  03/20/2022  . HIV Screening  Completed    The following portions of the patient's history were reviewed and updated as appropriate: allergies, current medications, past family history, past medical history, past social history, past surgical history and problem list.  Review of Systems A comprehensive review of systems was negative.   Objective:    BP 125/70   Pulse 85   Ht 5\' 6"  (1.676 m)   Wt 221 lb (100.2 kg)   LMP 04/27/2019 (Exact Date)   SpO2 100%   BMI 35.67 kg/m  General appearance: alert, cooperative and appears stated age Head: Normocephalic, without obvious abnormality, atraumatic Eyes: conj clear, EOMi, PEERLA Ears: normal TM's and external ear canals both ears Nose: Nares normal. Septum midline. Mucosa normal. No drainage or sinus tenderness. Throat: lips, mucosa, and tongue normal; teeth and gums normal Neck: no adenopathy, no carotid bruit, no JVD, supple, symmetrical, trachea midline and thyroid not enlarged, symmetric, no tenderness/mass/nodules Back: symmetric, no curvature. ROM  normal. No CVA tenderness. Lungs: clear to auscultation bilaterally Heart: regular rate and rhythm, S1, S2 normal, no murmur, click, rub or gallop Abdomen: soft, non-tender; bowel sounds normal; no masses,  no organomegaly Extremities: extremities normal, atraumatic, no cyanosis or edema Pulses: 2+ and symmetric Skin: Skin color, texture, turgor normal. No rashes or lesions Lymph nodes: Cervical, supraclavicular, and axillary nodes normal. Neurologic: Alert and oriented X 3, normal strength and tone. Normal symmetric reflexes. Normal coordination and gait    Assessment:    Healthy female exam.      Plan:     See After Visit Summary for Counseling Recommendations    Keep up a regular exercise program and make sure you  are eating a healthy diet Try to eat 4 servings of dairy a day, or if you are lactose intolerant take a calcium with vitamin D daily.  Your vaccines are up to date.  Following with Dr. Felecia Shelling for her migraines. Due for routine lab work.

## 2019-05-04 DIAGNOSIS — Z0000 Encounter for general adult medical examination without abnormal findings: Secondary | ICD-10-CM | POA: Diagnosis not present

## 2019-05-04 MED FILL — CYCLOBENZAPRINE HCL 5 MG TA: 5 | 90 days supply | Qty: 90 | Fill #2

## 2019-05-04 MED FILL — SUMATRIPTAN SUCC 100 MG TAB: 100 | 30 days supply | Qty: 9 | Fill #7

## 2019-05-05 LAB — CBC
HCT: 45.3 % — ABNORMAL HIGH (ref 35.0–45.0)
Hemoglobin: 14.7 g/dL (ref 11.7–15.5)
MCH: 27.9 pg (ref 27.0–33.0)
MCHC: 32.5 g/dL (ref 32.0–36.0)
MCV: 86.1 fL (ref 80.0–100.0)
MPV: 12.2 fL (ref 7.5–12.5)
Platelets: 256 10*3/uL (ref 140–400)
RBC: 5.26 10*6/uL — ABNORMAL HIGH (ref 3.80–5.10)
RDW: 12.1 % (ref 11.0–15.0)
WBC: 5.8 10*3/uL (ref 3.8–10.8)

## 2019-05-05 LAB — COMPLETE METABOLIC PANEL WITH GFR
AG Ratio: 1.6 (calc) (ref 1.0–2.5)
ALT: 26 U/L (ref 6–29)
AST: 18 U/L (ref 10–30)
Albumin: 4.4 g/dL (ref 3.6–5.1)
Alkaline phosphatase (APISO): 63 U/L (ref 31–125)
BUN: 11 mg/dL (ref 7–25)
CO2: 26 mmol/L (ref 20–32)
Calcium: 9.4 mg/dL (ref 8.6–10.2)
Chloride: 104 mmol/L (ref 98–110)
Creat: 0.82 mg/dL (ref 0.50–1.10)
GFR, Est African American: 106 mL/min/{1.73_m2} (ref >=60)
GFR, Est Non African American: 91 mL/min/{1.73_m2} (ref >=60)
Globulin: 2.7 g/dL (calc) (ref 1.9–3.7)
Glucose, Bld: 119 mg/dL — ABNORMAL HIGH (ref 65–99)
Potassium: 4.6 mmol/L (ref 3.5–5.3)
Sodium: 137 mmol/L (ref 135–146)
Total Bilirubin: 0.5 mg/dL (ref 0.2–1.2)
Total Protein: 7.1 g/dL (ref 6.1–8.1)

## 2019-05-05 LAB — LIPID PANEL
Cholesterol: 172 mg/dL (ref <200)
HDL: 42 mg/dL — ABNORMAL LOW (ref >=50)
LDL Cholesterol (Calc): 104 mg/dL (calc) — ABNORMAL HIGH
Non-HDL Cholesterol (Calc): 130 mg/dL (calc) — ABNORMAL HIGH (ref <130)
Total CHOL/HDL Ratio: 4.1 (calc) (ref <5.0)
Triglycerides: 151 mg/dL — ABNORMAL HIGH (ref <150)

## 2019-05-05 LAB — HEMOGLOBIN A1C
Hgb A1c MFr Bld: 5.7 % of total Hgb — ABNORMAL HIGH (ref <5.7)
Mean Plasma Glucose: 117 (calc)
eAG (mmol/L): 6.5 (calc)

## 2019-05-05 LAB — TSH: TSH: 1.14 mIU/L

## 2019-05-11 ENCOUNTER — Ambulatory Visit: Payer: 59 | Admitting: Neurology

## 2019-05-25 ENCOUNTER — Telehealth: Payer: Self-pay | Admitting: Neurology

## 2019-05-25 NOTE — Telephone Encounter (Signed)
Pt called in and stated she was advised to call in and advise Dr Felecia Shelling on how she was feeling after her 1st botox treatment, she states she has had a slight improvement not a major improvement but she does notice small changes, the pain was less but they are still breaking through quite a bit.

## 2019-05-26 NOTE — Telephone Encounter (Signed)
Called the patient, there was no answer. LVM informing the patient that Dr Felecia Shelling reviewed her message she had left. He wanted me to reach out to her to make her aware that he reviewed that and would like her to encourage one more round of the botox in 3 mths from when she got the botox. Advised the hope would be possibly it may help build up with the first series. LVM with this information and advised if she had questions to call back.

## 2019-05-26 NOTE — Telephone Encounter (Signed)
Please let her know I would like to try the botox an additional time (3 months after first) to see if it builds on the first series.   If not better we would then try something else

## 2019-06-07 MED FILL — SUMATRIPTAN SUCC 100 MG TAB: 100 | 30 days supply | Qty: 9 | Fill #8

## 2019-06-17 ENCOUNTER — Telehealth: Payer: Self-pay | Admitting: Neurology

## 2019-06-17 NOTE — Telephone Encounter (Signed)
I called [facility] to check that the patient was active and check codes [phone] and [phone]. Patient is active and [facility]. JUV#[id number]. DW

## 2019-06-23 MED FILL — NADOLOL 20 MG TAB: 20 | 90 days supply | Qty: 270 | Fill #3

## 2019-06-29 ENCOUNTER — Telehealth: Payer: Self-pay | Admitting: Neurology

## 2019-06-29 ENCOUNTER — Ambulatory Visit: Payer: 59 | Admitting: Neurology

## 2019-06-29 ENCOUNTER — Other Ambulatory Visit: Payer: Self-pay

## 2019-06-29 VITALS — BP 110/70 | HR 82 | Temp 97.7°F | Ht 66.0 in | Wt 221.5 lb

## 2019-06-29 DIAGNOSIS — M542 Cervicalgia: Secondary | ICD-10-CM | POA: Diagnosis not present

## 2019-06-29 DIAGNOSIS — G43709 Chronic migraine without aura, not intractable, without status migrainosus: Secondary | ICD-10-CM | POA: Diagnosis not present

## 2019-06-29 DIAGNOSIS — G43009 Migraine without aura, not intractable, without status migrainosus: Secondary | ICD-10-CM

## 2019-06-29 DIAGNOSIS — IMO0002 Reserved for concepts with insufficient information to code with codable children: Secondary | ICD-10-CM

## 2019-06-29 MED ORDER — NADOLOL 20 MG PO TABS: ORAL_TABLET | ORAL | 3 refills | Status: DC

## 2019-06-29 MED ORDER — CYCLOBENZAPRINE HCL 5 MG PO TABS: ORAL_TABLET | ORAL | 3 refills | Status: DC

## 2019-06-29 MED ORDER — SUMATRIPTAN SUCCINATE 100 MG PO TABS: ORAL_TABLET | ORAL | 3 refills | Status: DC

## 2019-06-29 NOTE — Progress Notes (Signed)
[facility]  PATIENT: Lisa Cannon DOB: 04/03/1981  REFERRING DOCTOR OR PCP:  Dr. Madilyn Fireman SOURCE: patient, notes from PCP, labs  _________________________________   HISTORICAL  CHIEF COMPLAINT:  Chief Complaint  Patient presents with   Follow-up    RM 12, alone. Last seen for botox 03/18/19 for migraines. She does not feel last botox helped improve migraines. Migraines did not worsen, stayed about the same.   Botulinum Toxin Injection    [id number]. Lot: [id number]. Expiration: 01/2022. [place]: [id number].     HISTORY OF PRESENT ILLNESS:  Lisa Cannon is a 38 yo woman with chronic  migraine headaches.  Update 06/29/19: On Botox, she is experiencing somewhat fewer HA (still experiencing HA 4-5 days a week vs 5-6/week before Botox).   Migraines are worse on her left but eye pressure is more symmetric.    She also has neck pain associated with the migraines.    It did not improve any with the Botox.     Topiramate was tried twice with no benefit and some side effects.   A combination of Corgard and Flexeril helped mildly (but only 20-25% reduction in frequency).   She has tried and failed propranolol.   Effexor or amitriptyline did not help and the tricyclic caused side effects.  After she took, Aimovig, she was HA free x 7 days but she felt no benefit the other 5-6 months she was on the drug.      Update 03/18/2019: Since her last visit, she tried tizanidine.  Unfortunately, even low doses were poorly tolerated due to nausea and reduced balance.  She has failed multiple preventative medications for migraines most recently Aimovig.   She is currently experiencing about 20-24 migraines a month and they last for most of the day.  They are predominantly in the left frontal and temporal region though sometimes she also gets some neck pain.  There is often pressure in the eyes or pain in the back of the head.  She has nausea but rarely has vomiting.  There is photophobia  and phonophobia and moving will worsen the headache.  Lying down does not help much.  Imitrex will help for about 1/2-day.  We went over the risks and benefits of Botox therapy.  From 02/17/2019: She is a 38 year old woman who has had migraines since age 21.   Initially, headaches were sporadic but they have become more chronic.     Currently, she has 16-20 migraine days a month for 4 or more hours a day.   She does not have aura.  Migraines are left frontal with a throbbing knifelike quality.   She often has pressure in her eyes and pain in the back of the head.   She sometimes has nausea and no vomiting.    She has photophobia and phonophobia.   During her cycle, HA's are daily and the rest of the month they are intermittent. Moving her head intensifies the pain.    Laying down does not help.     Imitrex will usually help for the rest of the day but then the HA returns.    She tried Aimovig. After the first dose she was migraine free x 7 days and then migraines returned.   A higher dose had not helped.     She was on gabapentin for over a year but had no benefit and actually did better when she stopped.    Topiramate was tried twice with no benefit and some  side effects.   A combination of Corgard and Flexeril helped mildly (but only 20-25% reduction in frequency).   She has tried and failed propranolol.   Effexor or amitriptyline did not help and the tricyclic caused side effects.    Aimovig had not helped.     She has mild seasonal allergies no other medical issues  REVIEW OF SYSTEMS: Constitutional: No fevers, chills, sweats, or change in appetite Eyes: No visual changes, double vision, eye pain Ear, nose and throat: No hearing loss, ear pain, nasal congestion, sore throat Cardiovascular: No chest pain, palpitations Respiratory: No shortness of breath at rest or with exertion.   No wheezes GastrointestinaI: No nausea, vomiting, diarrhea, abdominal pain, fecal incontinence Genitourinary: No  dysuria, urinary retention or frequency.  No nocturia. Musculoskeletal: No neck pain, back pain Integumentary: No rash, pruritus, skin lesions Neurological: as above Psychiatric: No depression at this time.  No anxiety Endocrine: No palpitations, diaphoresis, change in appetite, change in weigh or increased thirst Hematologic/Lymphatic: No anemia, purpura, petechiae. Allergic/Immunologic: Has seasonal allergies  ALLERGIES: No Known Allergies  HOME MEDICATIONS:  Current Outpatient Medications:    cyclobenzaprine (FLEXERIL) 5 MG tablet, Take 1 tablet daily as needed for neck pain, Disp: 90 tablet, Rfl: 3   fluticasone (FLONASE) 50 MCG/ACT nasal spray, Place into both nostrils as needed for allergies or rhinitis., Disp: , Rfl:    Multiple Vitamin (MULTIVITAMIN) capsule, Take 1 capsule by mouth daily., Disp: , Rfl:    nadolol (CORGARD) 20 MG tablet, TAKE 3 TABLETS BY MOUTH DAILY, Disp: 270 tablet, Rfl: 3   naproxen sodium (ALEVE) 220 MG tablet, Take 220 mg by mouth daily as needed (for pain)., Disp: , Rfl:    Probiotic Product (PROBIOTIC PO), Take by mouth., Disp: , Rfl:    SUMAtriptan (IMITREX) 100 MG tablet, TAKE 1 TABLET BY MOUTH AT ONSET OF HEADACHE. MAY REPEAT DOSE AFTER 2 HOURS. DO NOT TAKE MORE THAN 2 DAYS A WEEK., Disp: 30 tablet, Rfl: 3  PAST MEDICAL HISTORY: Past Medical History:  Diagnosis Date   Abnormal Pap smear of cervix    Migraines     PAST SURGICAL HISTORY: Past Surgical History:  Procedure Laterality Date   CERVICAL BIOPSY  W/ LOOP ELECTRODE EXCISION  2002   LAPAROSCOPIC BILATERAL SALPINGECTOMY Bilateral 09/12/2015   Procedure: LAPAROSCOPIC BILATERAL SALPINGECTOMY;  Surgeon: Emily Filbert, MD;  Location: [facility];  Service: Gynecology;  Laterality: Bilateral;   LAPAROSCOPIC LYSIS OF ADHESIONS  09/12/2015   Procedure: LAPAROSCOPIC LYSIS OF ADHESIONS;  Surgeon: Emily Filbert, MD;  Location: [facility];  Service: Gynecology;;   TUBAL LIGATION     WISDOM TOOTH  EXTRACTION      FAMILY HISTORY: Family History  Problem Relation Age of Onset   Hypertension Maternal Grandfather    Cancer Paternal Grandfather        lung cancer   Cancer Paternal Grandmother        brain   Diabetes Maternal Aunt    Hypertension Maternal Aunt     SOCIAL HISTORY:  Social History   Socioeconomic History   Marital status: Married    Spouse name: Not on file   Number of children: 1   Years of education: 16   Highest education level: Not on file  Occupational History   Occupation: homemaker  Social Designer, fashion/clothing strain: Not on file   Food insecurity    Worry: Not on file    Inability: Not on file   Transportation needs  Medical: Not on file    Non-medical: Not on file  Tobacco Use   Smoking status: Never Smoker   Smokeless tobacco: Never Used  Substance and Sexual Activity   Alcohol use: No    Alcohol/week: 0.0 standard drinks   Drug use: No   Sexual activity: Yes    Partners: Male    Birth control/protection: Surgical  Lifestyle   Physical activity    Days per week: Not on file    Minutes per session: Not on file   Stress: Not on file  Relationships   Social connections    Talks on phone: Not on file    Gets together: Not on file    Attends religious service: Not on file    Active member of club or organization: Not on file    Attends meetings of clubs or organizations: Not on file    Relationship status: Not on file   Intimate partner violence    Fear of current or ex partner: Not on file    Emotionally abused: Not on file    Physically abused: Not on file    Forced sexual activity: Not on file  Other Topics Concern   Not on file  Social History Narrative   Lives with husband   Caffeine use: none   Right handed      PHYSICAL EXAM  Vitals:   06/29/19 1026  BP: 110/70  Pulse: 82  Temp: 97.7 F (36.5 C)  SpO2: 98%  Weight: 221 lb 8 oz (100.5 kg)  Height: 5\' 6"  (1.676 m)    Body mass  index is 35.75 kg/m.   General: The patient is well-developed and well-nourished and in no acute distress  Neck: Range of motion is normal in the neck.  She currently does not have much tenderness in the splenius capitis or paraspinal muscles.  Neurologic Exam  Mental status: The patient is alert and oriented x 3 at the time of the examination. The patient has apparent normal recent and remote memory, with an apparently normal attention span and concentration ability.   Speech is normal.  Cranial nerves: Extraocular movements are full.  Facial strength and sensation is normal.  The tongue is midline, and the patient has symmetric elevation of the soft palate. No obvious hearing deficits are noted.  Motor:  Muscle bulk is normal.   Strength is normal in the arms  Coordination: She has good finger-nose-finger bilaterally.  Gait and station: Station is normal.   Gait is normal. Tandem gait is normal. Romberg is negative.     DIAGNOSTIC DATA (LABS, IMAGING, TESTING) - I reviewed patient records, labs, notes, testing and imaging myself where available.  Lab Results  Component Value Date   WBC 5.8 05/04/2019   HGB 14.7 05/04/2019   HCT 45.3 (H) 05/04/2019   MCV 86.1 05/04/2019   PLT 256 05/04/2019      Component Value Date/Time   NA 137 05/04/2019 0830   K 4.6 05/04/2019 0830   CL 104 05/04/2019 0830   CO2 26 05/04/2019 0830   GLUCOSE 119 (H) 05/04/2019 0830   GLUCOSE 82 05/18/2012 1328   BUN 11 05/04/2019 0830   CREATININE 0.82 05/04/2019 0830   CALCIUM 9.4 05/04/2019 0830   PROT 7.1 05/04/2019 0830   ALBUMIN 4.2 05/03/2016 0808   AST 18 05/04/2019 0830   ALT 26 05/04/2019 0830   ALKPHOS 69 05/03/2016 0808   BILITOT 0.5 05/04/2019 0830   GFRNONAA 91 05/04/2019 0830   GFRAA  106 05/04/2019 0830   Lab Results  Component Value Date   CHOL 172 05/04/2019   HDL 42 (L) 05/04/2019   LDLCALC 104 (H) 05/04/2019   TRIG 151 (H) 05/04/2019   CHOLHDL 4.1 05/04/2019   Lab  Results  Component Value Date   HGBA1C 5.7 (H) 05/04/2019   No results found for: [id number] Lab Results  Component Value Date   TSH 1.14 05/04/2019       ASSESSMENT AND PLAN  1. Chronic migraine   2. Neck pain   3. Migraine without aura and without status migrainosus, not intractable       1.   Botox  units was injected as follows:   Corrugators and procerus (5 units x 3), frontalis (5 units x 5 (2 right 3 left)), temporalis (5 units x 6 on the left and 5 units x 4 on the right), occipitalis (5 units on the left x3 and on the right x2), splenius capitis 15 units x 2, trapezius 15 units x 2, C3-C4 paraspinal muscles 7.5 units x2.  Wasted 0 units 2.    If she has no further benefit at all consider a switch to Vyepti (eptinezumab) injection. 3.    Stay active and exercise as tolerated. 4..   Return in 3 months, sooner if new or worsening neurologic issues  Ellesse Antenucci A. Felecia Shelling, MD, [facility] 8/58/8502, 77:41 AM Certified in Neurology, Clinical Neurophysiology, Sleep Medicine, Pain Medicine and Neuroimaging  [facility] [address] [place], [place] [address] [phone]

## 2019-06-29 NOTE — Telephone Encounter (Signed)
3 mos btx

## 2019-07-01 MED FILL — SUMATRIPTAN SUCC 100 MG TAB: 100 | 30 days supply | Qty: 9 | Fill #9

## 2019-07-05 NOTE — Telephone Encounter (Signed)
I called to schedule the patient but she did not answer so I left a VM asking her to call back. I went ahead and scheduled her apt please confirm it works if she calls back.

## 2019-07-29 MED FILL — SUMATRIPTAN SUCC 100 MG TAB: 100 | 30 days supply | Qty: 9 | Fill #10

## 2019-07-29 MED FILL — CYCLOBENZAPRINE HCL 5 MG TA: 5 | 90 days supply | Qty: 90 | Fill #3

## 2019-08-10 ENCOUNTER — Ambulatory Visit (INDEPENDENT_AMBULATORY_CARE_PROVIDER_SITE_OTHER): Payer: 59 | Admitting: Family Medicine

## 2019-08-10 ENCOUNTER — Other Ambulatory Visit: Payer: Self-pay

## 2019-08-10 DIAGNOSIS — Z23 Encounter for immunization: Secondary | ICD-10-CM | POA: Diagnosis not present

## 2019-08-12 DIAGNOSIS — H6123 Impacted cerumen, bilateral: Secondary | ICD-10-CM | POA: Diagnosis not present

## 2019-08-12 DIAGNOSIS — H60333 Swimmer's ear, bilateral: Secondary | ICD-10-CM | POA: Diagnosis not present

## 2019-08-26 ENCOUNTER — Telehealth: Payer: Self-pay | Admitting: Neurology

## 2019-08-26 NOTE — Telephone Encounter (Signed)
Faxed completed/signed insurance verification form to vyepti [facility] at [phone]. Received fax confirmation.  Waiting on MD to sign orders for infusion suite

## 2019-08-26 NOTE — Telephone Encounter (Signed)
Called, LVM relaying Dr. Garth Bigness recommendation. Asked her to call back if she would like to proceed with trying Vyepti.

## 2019-08-26 NOTE — Telephone Encounter (Signed)
Pt has called back and she states she would like to move forward with the infusion.  No call back requested

## 2019-08-26 NOTE — Telephone Encounter (Signed)
Pt has called to inform that she is worse as a result of the Botox.  Pt has terrible migraines and vertigo as well.  Please call

## 2019-08-26 NOTE — Telephone Encounter (Signed)
Since the Botox did not help we can see if we can get her approved for Vyepti (its the IV medication similar to Aimovig or Ajovy)   We can run through [facility]

## 2019-08-26 NOTE — Telephone Encounter (Signed)
Dr. Sater- what would you recommend? 

## 2019-08-27 MED FILL — SUMATRIPTAN SUCC 100 MG TAB: 100 | 30 days supply | Qty: 9 | Fill #11

## 2019-08-30 NOTE — Telephone Encounter (Signed)
Late entry from 08/26/2019: gave completed/signed orders to [facility] to start processing for pt.

## 2019-09-01 NOTE — Telephone Encounter (Signed)
Kieth Brightly from [employer] is requesting a call back to discuss order   CB# [phone] opt 1

## 2019-09-01 NOTE — Telephone Encounter (Signed)
Called back and spoke with [facility]. They had incorrect fax number: [phone]. Advised area code *[phone]. Verified this was correct on from we sent. She will re-send insurance investigation update via fax. Nothing further needed.

## 2019-09-11 MED FILL — NADOLOL 20 MG TABS: 20 | 90 days supply | Qty: 270 | Fill #0

## 2019-09-21 ENCOUNTER — Telehealth: Payer: Self-pay | Admitting: *Deleted

## 2019-09-21 DIAGNOSIS — G43709 Chronic migraine without aura, not intractable, without status migrainosus: Secondary | ICD-10-CM

## 2019-09-21 DIAGNOSIS — IMO0002 Reserved for concepts with insufficient information to code with codable children: Secondary | ICD-10-CM

## 2019-09-21 NOTE — Telephone Encounter (Signed)
Received fax from [facility] that [place] denied. Faxed appeal letter to [employer] at [phone]. Marked Urgent. Received fax confirmation. Waiting on determination.

## 2019-09-23 NOTE — Telephone Encounter (Signed)
Took call from phone staff. Spoke with Pamala Hurry from [facility] appeals department. Appeal denied because pt must try and fail both aimovig or emgality. We can do external review if pt has contraindication to taking emgality. They will send up denial letter for appeal. She will see if it can be faxed to [phone]. Her direct # [phone]. I will speak w/ MD to see how he wants to proceed.

## 2019-09-28 MED ORDER — EMGALITY 120 MG/ML ~~LOC~~ SOAJ: 1.0000 "pen " | SUBCUTANEOUS | 11 refills | Status: DC

## 2019-09-28 MED ORDER — EMGALITY 120 MG/ML ~~LOC~~ SOAJ: 2.0000 "pen " | Freq: Once | SUBCUTANEOUS | 0 refills | Status: AC

## 2019-09-28 NOTE — Telephone Encounter (Signed)
Spoke with Dr. Felecia Shelling. Ok to cx appt this week and push out 3 months

## 2019-09-28 NOTE — Telephone Encounter (Signed)
Called pt. She is ok to push out f/u to January 2021. She has no concerns to discuss at this time. I scheduled appt for 12/30/2019 at 3pm with Dr. Felecia Shelling. Cx appt this week.

## 2019-09-28 NOTE — Addendum Note (Signed)
Addended by: Hope Pigeon on: 09/28/2019 09:31 AM   Modules accepted: Orders

## 2019-09-28 NOTE — Telephone Encounter (Signed)
Called and spoke with pt. Advised she will also need to try/fail emgality first before they will cover Vyepti infusion. Pt agreeable to try emgality. She is aware first dose in 2pens for first month and then 1 pen per month thereafter for maintenance dose. She has f/u this Thursday which was supposed to be for botox but she is wondering if this can be cx and f/u moved out. States botox was ineffective. Advised MD seeing pt but once he is finished I will speak with him and call her back on whether she needs appt or not this week or if it can be r/s.

## 2019-09-29 ENCOUNTER — Telehealth: Payer: Self-pay | Admitting: *Deleted

## 2019-09-29 NOTE — Telephone Encounter (Signed)
Called pharmacy and spoke with Manuela Schwartz. She deleted rx for emgality #2pen, no refills since pt picked up sample at our office today for this. They will keep #1pen, 11 refills rx on file.

## 2019-09-29 NOTE — Telephone Encounter (Signed)
Pt will come by this afternoon to pick up samples.

## 2019-09-29 NOTE — Telephone Encounter (Signed)
Pt picked up 1 box of emgality samples: Lot: [id number]. Expiration: 07/2020.

## 2019-09-29 NOTE — Telephone Encounter (Signed)
Noted  

## 2019-09-29 NOTE — Telephone Encounter (Signed)
Called, LVM for pt letting her know we had a sample of emgality here at the office that I held for her. This will be for her first month (2 pens) for the loading dose. Advised she can pick up today or tomorrow. We are waiting on determination for PA from insurance. Asked her to call back to let me know she received message and when she plans to come get sample.

## 2019-09-29 NOTE — Telephone Encounter (Signed)
Submitted PA emgality on CMM. Key: [id number]. Waiting on determination from [place].

## 2019-09-30 ENCOUNTER — Ambulatory Visit: Payer: 59 | Admitting: Neurology

## 2019-10-04 MED FILL — EMGALITY 120 MG/ML SOAJ: 120 | 30 days supply | Qty: 1 | Fill #0

## 2019-10-04 MED FILL — SUMATRIPTAN SUCC 100 MG TAB: 100 | 30 days supply | Qty: 9 | Fill #0

## 2019-10-04 NOTE — Telephone Encounter (Signed)
PA emgality approved for 2/pens per month for 1 fill from 09/28/19-10/18/19 (pt received sample at office for this instead). Then approved for 1pen/month from 10/21/19-03/19/20. PA auth# [id number] via [employer].

## 2019-11-01 MED FILL — CYCLOBENZAPRINE HCL 5 MG TA: 5 | 90 days supply | Qty: 90 | Fill #0

## 2019-11-01 MED FILL — SUMATRIPTAN SUCC 100 MG TAB: 100 | 30 days supply | Qty: 9 | Fill #1

## 2019-11-15 MED FILL — EMGALITY 120 MG/ML SOAJ: 120 | 30 days supply | Qty: 1 | Fill #1

## 2019-11-30 MED FILL — SUMATRIPTAN SUCC 100 MG TAB: 100 | 30 days supply | Qty: 9 | Fill #2

## 2019-12-10 MED FILL — NADOLOL 20 MG TABS: 20 | 90 days supply | Qty: 270 | Fill #1

## 2019-12-28 ENCOUNTER — Ambulatory Visit (INDEPENDENT_AMBULATORY_CARE_PROVIDER_SITE_OTHER): Payer: 59 | Admitting: Advanced Practice Midwife

## 2019-12-28 ENCOUNTER — Encounter: Payer: Self-pay | Admitting: Advanced Practice Midwife

## 2019-12-28 ENCOUNTER — Other Ambulatory Visit: Payer: Self-pay

## 2019-12-28 VITALS — BP 149/94 | HR 101 | Wt 222.0 lb

## 2019-12-28 DIAGNOSIS — Z124 Encounter for screening for malignant neoplasm of cervix: Secondary | ICD-10-CM

## 2019-12-28 DIAGNOSIS — Z01419 Encounter for gynecological examination (general) (routine) without abnormal findings: Secondary | ICD-10-CM

## 2019-12-28 DIAGNOSIS — Z1151 Encounter for screening for human papillomavirus (HPV): Secondary | ICD-10-CM | POA: Diagnosis not present

## 2019-12-28 DIAGNOSIS — N898 Other specified noninflammatory disorders of vagina: Secondary | ICD-10-CM

## 2019-12-28 NOTE — Progress Notes (Signed)
GYNECOLOGY ANNUAL PREVENTATIVE CARE ENCOUNTER NOTE  History:     Lisa Cannon is a 39 y.o. G48P1001 female here for a routine annual gynecologic exam.  Current complaints: external vaginal itching. She was treated for a yeast infection in December. Had another one at the beginning of the month. Treated with OTC Monistat, which she reports has mostly resolved, however continues to have slight external itching.  She denies vaginal discharge or odor. Denies abnormal vaginal bleeding, pelvic pain, problems with intercourse or other gynecologic concerns.  Gynecologic History Patient's last menstrual period was 12/07/2019. Contraception: tubal ligation Last Pap: 12/10/2017. Results were: normal with positive HPV.  Last mammogram: n/a.   Obstetric History OB History  Gravida Para Term Preterm AB Living  1 1 1  0 0 1  SAB TAB Ectopic Multiple Live Births  0 0 0 0 1    # Outcome Date GA Lbr Len/2nd Weight Sex Delivery Anes PTL Lv  1 Term 08/15/12 [redacted]w[redacted]d 09:33 / 00:18 2960 g F Vag-Vacuum Local  LIV    Past Medical History:  Diagnosis Date  . Abnormal Pap smear of cervix   . Migraines     Past Surgical History:  Procedure Laterality Date  . CERVICAL BIOPSY  W/ LOOP ELECTRODE EXCISION  2002  . LAPAROSCOPIC BILATERAL SALPINGECTOMY Bilateral 09/12/2015   Procedure: LAPAROSCOPIC BILATERAL SALPINGECTOMY;  Surgeon: Emily Filbert, MD;  Location: [facility];  Service: Gynecology;  Laterality: Bilateral;  . LAPAROSCOPIC LYSIS OF ADHESIONS  09/12/2015   Procedure: LAPAROSCOPIC LYSIS OF ADHESIONS;  Surgeon: Emily Filbert, MD;  Location: [facility];  Service: Gynecology;;  . TUBAL LIGATION    . WISDOM TOOTH EXTRACTION      Current Outpatient Medications on File Prior to Visit  Medication Sig Dispense Refill  . cyclobenzaprine (FLEXERIL) 5 MG tablet Take 1 tablet daily as needed for neck pain 90 tablet 3  . fluticasone (FLONASE) 50 MCG/ACT nasal spray Place into both nostrils as needed for allergies or  rhinitis.    . Galcanezumab-gnlm (EMGALITY) 120 MG/ML SOAJ Inject 1 pen into the skin every 30 (thirty) days. 1 pen 11  . Multiple Vitamin (MULTIVITAMIN) capsule Take 1 capsule by mouth daily.    . nadolol (CORGARD) 20 MG tablet TAKE 3 TABLETS BY MOUTH DAILY 270 tablet 3  . naproxen sodium (ALEVE) 220 MG tablet Take 220 mg by mouth daily as needed (for pain).    . Probiotic Product (PROBIOTIC PO) Take by mouth.    . SUMAtriptan (IMITREX) 100 MG tablet TAKE 1 TABLET BY MOUTH AT ONSET OF HEADACHE. MAY REPEAT DOSE AFTER 2 HOURS. DO NOT TAKE MORE THAN 2 DAYS A WEEK. 30 tablet 3   No current facility-administered medications on file prior to visit.    No Known Allergies  Social History:  reports that she has never smoked. She has never used smokeless tobacco. She reports that she does not drink alcohol or use drugs.  Family History  Problem Relation Age of Onset  . Hypertension Maternal Grandfather   . Cancer Paternal Grandfather        lung cancer  . Cancer Paternal Grandmother        brain  . Diabetes Maternal Aunt   . Hypertension Maternal Aunt     The following portions of the patient's history were reviewed and updated as appropriate: allergies, current medications, past family history, past medical history, past social history, past surgical history and problem list.  Review of Systems Pertinent items  noted in HPI and remainder of comprehensive ROS otherwise negative.  Physical Exam:  BP (!) 149/94   Pulse (!) 101   Wt 100.7 kg   LMP 12/07/2019   BMI 35.83 kg/m  CONSTITUTIONAL: Well-developed, well-nourished female in no acute distress.  HENT:  Normocephalic, atraumatic, External right and left ear normal. Oropharynx is clear and moist EYES: Conjunctivae and EOM are normal. Pupils are equal, round, and reactive to light. No scleral icterus.  NECK: Normal range of motion, supple, no masses.  Normal thyroid.  SKIN: Skin is warm and dry. No rash noted. Not diaphoretic. No  erythema. No pallor. MUSCULOSKELETAL: Normal range of motion. No tenderness.  No cyanosis, clubbing, or edema.  2+ distal pulses. NEUROLOGIC: Alert and oriented to person, place, and time. Normal reflexes, muscle tone coordination. No cranial nerve deficit noted. PSYCHIATRIC: Normal mood and affect. Normal behavior. Normal judgment and thought content. CARDIOVASCULAR: Normal heart rate noted, regular rhythm RESPIRATORY: Clear to auscultation bilaterally. Effort and breath sounds normal, no problems with respiration noted. BREASTS: Symmetric in size. No masses, skin changes, nipple drainage, or lymphadenopathy. ABDOMEN: Soft, normal bowel sounds, no distention noted.  No tenderness, rebound or guarding.  PELVIC: Normal appearing external genitalia; No redness or irritation noted. Normal appearing pink, rugated vaginal mucosa; cervix smooth, pink, without lesions or masses.  Small amount of thin, white discharge. Pap smear obtained. Normal uterine size, no other palpable masses, no uterine or adnexal tenderness.   Assessment and Plan:    1. Well woman exam with routine gynecological exam - Normal well woman exam with pap  - Cytology - PAP( [place])  2. Vaginal itching - External vaginal itching that has mostly improved with use of OTC Monistat - Swabs obtained  - May continue to use Monistat as needed  - Cervicovaginal ancillary only ([place])  Will follow up results of pap smear and manage accordingly. Routine preventative health maintenance measures emphasized. Please refer to After Visit Summary for other counseling recommendations.   Maryagnes Amos, SNM

## 2019-12-30 ENCOUNTER — Ambulatory Visit (INDEPENDENT_AMBULATORY_CARE_PROVIDER_SITE_OTHER): Payer: 59 | Admitting: Neurology

## 2019-12-30 ENCOUNTER — Other Ambulatory Visit: Payer: Self-pay

## 2019-12-30 ENCOUNTER — Encounter: Payer: Self-pay | Admitting: Neurology

## 2019-12-30 VITALS — BP 124/72 | HR 84 | Temp 97.5°F | Ht 66.0 in | Wt 221.0 lb

## 2019-12-30 DIAGNOSIS — G43709 Chronic migraine without aura, not intractable, without status migrainosus: Secondary | ICD-10-CM

## 2019-12-30 DIAGNOSIS — IMO0002 Reserved for concepts with insufficient information to code with codable children: Secondary | ICD-10-CM

## 2019-12-30 DIAGNOSIS — M542 Cervicalgia: Secondary | ICD-10-CM | POA: Diagnosis not present

## 2019-12-30 LAB — CERVICOVAGINAL ANCILLARY ONLY
Bacterial Vaginitis (gardnerella): NEGATIVE
Candida Glabrata: NEGATIVE
Candida Vaginitis: NEGATIVE
Comment: NEGATIVE
Comment: NEGATIVE
Comment: NEGATIVE

## 2019-12-30 LAB — CYTOLOGY - PAP
Comment: NEGATIVE
Diagnosis: NEGATIVE
High risk HPV: NEGATIVE

## 2019-12-30 NOTE — Progress Notes (Signed)
[facility]  PATIENT: Lisa Cannon DOB: 11-Sep-1981  REFERRING DOCTOR OR PCP:  Dr. Madilyn Fireman SOURCE: patient, notes from PCP, labs  _________________________________   HISTORICAL  CHIEF COMPLAINT:  Chief Complaint  Patient presents with  . Follow-up    RM 12. Last seen 06/29/2019. She is on Emgality and imitrex. She does not feel the Emgality has helped. She has been on it for 3 months.   . Migraine    Takes emgality     HISTORY OF PRESENT ILLNESS:  Lisa Cannon is a 39 yo woman with chronic migraine headaches.  Update 12/30/2019: She is experiencing migraine headaches 25-30 days a month for mor ethan 4 hours.    With a headache she gets photophobia and phonophobia.   She feels pressure in the eyes and pain in the forehead.   She will sometime have nausea but no vomiting.   Moving intensifies the pain.    Imitrex will sometimes help for a few hours.  At the last visit, she was started on Emgality.  She does not feel that it has helped much.  Previously, she tried Aimovig and felt she did better for about 7 days but then had no benefit the rest of the 5 months she was on the medicine.  Previously she was on Botox and had only minimal benefit (maybe 20 to 25%).  Topiramate has been tried a couple times but she did not get a benefit and also had side effects.  Propranolol, Effexor, amitriptyline, Corgard, Flexeril have been tried in the past as well with minimal to no benefit or side effects.      She continues to have frequent migraine headaches.  Update 06/29/19: On Botox, she is experiencing somewhat fewer HA (still experiencing HA 4-5 days a week vs 5-6/week before Botox).   Migraines are worse on her left but eye pressure is more symmetric.    She also has neck pain associated with the migraines.    It did not improve any with the Botox.     Topiramate was tried twice with no benefit and some side effects.   A combination of Corgard and Flexeril helped mildly  (but only 20-25% reduction in frequency).   She has tried and failed propranolol.   Effexor or amitriptyline did not help and the tricyclic caused side effects.  After she took, Aimovig, she was HA free x 7 days but she felt no benefit the other 5-6 months she was on the drug.      Update 03/18/2019: Since her last visit, she tried tizanidine.  Unfortunately, even low doses were poorly tolerated due to nausea and reduced balance.  She has failed multiple preventative medications for migraines most recently Aimovig.   She is currently experiencing about 20-24 migraines a month and they last for most of the day.  They are predominantly in the left frontal and temporal region though sometimes she also gets some neck pain.  There is often pressure in the eyes or pain in the back of the head.  She has nausea but rarely has vomiting.  There is photophobia and phonophobia and moving will worsen the headache.  Lying down does not help much.  Imitrex will help for about 1/2-day.  We went over the risks and benefits of Botox therapy.  From 02/17/2019: She is a 39 year old woman who has had migraines since age 88.   Initially, headaches were sporadic but they have become more chronic.  Currently, she has 16-20 migraine days a month for 4 or more hours a day.   She does not have aura.  Migraines are left frontal with a throbbing knifelike quality.   She often has pressure in her eyes and pain in the back of the head.   She sometimes has nausea and no vomiting.    She has photophobia and phonophobia.   During her cycle, HA's are daily and the rest of the month they are intermittent. Moving her head intensifies the pain.    Laying down does not help.     Imitrex will usually help for the rest of the day but then the HA returns.    She tried Aimovig. After the first dose she was migraine free x 7 days and then migraines returned.   A higher dose had not helped.     She was on gabapentin for over a year but had no benefit  and actually did better when she stopped.    Topiramate was tried twice with no benefit and some side effects.   A combination of Corgard and Flexeril helped mildly (but only 20-25% reduction in frequency).   She has tried and failed propranolol.   Effexor or amitriptyline did not help and the tricyclic caused side effects.    Aimovig had not helped.     She has mild seasonal allergies no other medical issues  REVIEW OF SYSTEMS: Constitutional: No fevers, chills, sweats, or change in appetite Eyes: No visual changes, double vision, eye pain Ear, nose and throat: No hearing loss, ear pain, nasal congestion, sore throat Cardiovascular: No chest pain, palpitations Respiratory: No shortness of breath at rest or with exertion.   No wheezes GastrointestinaI: No nausea, vomiting, diarrhea, abdominal pain, fecal incontinence Genitourinary: No dysuria, urinary retention or frequency.  No nocturia. Musculoskeletal: No neck pain, back pain Integumentary: No rash, pruritus, skin lesions Neurological: as above Psychiatric: No depression at this time.  No anxiety Endocrine: No palpitations, diaphoresis, change in appetite, change in weigh or increased thirst Hematologic/Lymphatic: No anemia, purpura, petechiae. Allergic/Immunologic: Has seasonal allergies  ALLERGIES: No Known Allergies  HOME MEDICATIONS:  Current Outpatient Medications:  .  cyclobenzaprine (FLEXERIL) 5 MG tablet, Take 1 tablet daily as needed for neck pain, Disp: 90 tablet, Rfl: 3 .  fluticasone (FLONASE) 50 MCG/ACT nasal spray, Place into both nostrils as needed for allergies or rhinitis., Disp: , Rfl:  .  Galcanezumab-gnlm (EMGALITY) 120 MG/ML SOAJ, Inject 1 pen into the skin every 30 (thirty) days., Disp: 1 pen, Rfl: 11 .  Multiple Vitamin (MULTIVITAMIN) capsule, Take 1 capsule by mouth daily., Disp: , Rfl:  .  nadolol (CORGARD) 20 MG tablet, TAKE 3 TABLETS BY MOUTH DAILY, Disp: 270 tablet, Rfl: 3 .  naproxen sodium (ALEVE)  220 MG tablet, Take 220 mg by mouth daily as needed (for pain)., Disp: , Rfl:  .  Probiotic Product (PROBIOTIC PO), Take by mouth., Disp: , Rfl:  .  SUMAtriptan (IMITREX) 100 MG tablet, TAKE 1 TABLET BY MOUTH AT ONSET OF HEADACHE. MAY REPEAT DOSE AFTER 2 HOURS. DO NOT TAKE MORE THAN 2 DAYS A WEEK., Disp: 30 tablet, Rfl: 3  PAST MEDICAL HISTORY: Past Medical History:  Diagnosis Date  . Abnormal Pap smear of cervix   . Migraines     PAST SURGICAL HISTORY: Past Surgical History:  Procedure Laterality Date  . CERVICAL BIOPSY  W/ LOOP ELECTRODE EXCISION  2002  . LAPAROSCOPIC BILATERAL SALPINGECTOMY Bilateral 09/12/2015   Procedure: LAPAROSCOPIC  BILATERAL SALPINGECTOMY;  Surgeon: Emily Filbert, MD;  Location: [facility];  Service: Gynecology;  Laterality: Bilateral;  . LAPAROSCOPIC LYSIS OF ADHESIONS  09/12/2015   Procedure: LAPAROSCOPIC LYSIS OF ADHESIONS;  Surgeon: Emily Filbert, MD;  Location: [facility];  Service: Gynecology;;  . TUBAL LIGATION    . WISDOM TOOTH EXTRACTION      FAMILY HISTORY: Family History  Problem Relation Age of Onset  . Hypertension Maternal Grandfather   . Cancer Paternal Grandfather        lung cancer  . Cancer Paternal Grandmother        brain  . Diabetes Maternal Aunt   . Hypertension Maternal Aunt     SOCIAL HISTORY:  Social History   Socioeconomic History  . Marital status: Married    Spouse name: Not on file  . Number of children: 1  . Years of education: 48  . Highest education level: Bachelor's degree (e.g., BA, AB, BS)  Occupational History  . Occupation: homemaker  Tobacco Use  . Smoking status: Never Smoker  . Smokeless tobacco: Never Used  Substance and Sexual Activity  . Alcohol use: No    Alcohol/week: 0.0 standard drinks  . Drug use: No  . Sexual activity: Yes    Partners: Male    Birth control/protection: Surgical  Other Topics Concern  . Not on file  Social History Narrative   Lives with husband   Caffeine use: none   Right  handed    Social Determinants of Health   Financial Resource Strain:   . Difficulty of Paying Living Expenses: Not on file  Food Insecurity:   . Worried About Charity fundraiser in the Last Year: Not on file  . Ran Out of Food in the Last Year: Not on file  Transportation Needs:   . Lack of Transportation (Medical): Not on file  . Lack of Transportation (Non-Medical): Not on file  Physical Activity:   . Days of Exercise per Week: Not on file  . Minutes of Exercise per Session: Not on file  Stress:   . Feeling of Stress : Not on file  Social Connections:   . Frequency of Communication with Friends and Family: Not on file  . Frequency of Social Gatherings with Friends and Family: Not on file  . Attends Religious Services: Not on file  . Active Member of Clubs or Organizations: Not on file  . Attends Archivist Meetings: Not on file  . Marital Status: Not on file  Intimate Partner Violence:   . Fear of Current or Ex-Partner: Not on file  . Emotionally Abused: Not on file  . Physically Abused: Not on file  . Sexually Abused: Not on file     PHYSICAL EXAM  Vitals:   12/30/19 1456  BP: 124/72  Pulse: 84  Temp: (!) 97.5 F (36.4 C)  Weight: 221 lb (100.2 kg)  Height: 5\' 6"  (1.676 m)    Body mass index is 35.67 kg/m.   General: The patient is well-developed and well-nourished and in no acute distress  Neck: Range of motion is normal in the neck.  Mild left splenius capitus/paraspinal tenderness.  Neurologic Exam  Mental status: The patient is alert and oriented x 3 at the time of the examination. The patient has apparent normal recent and remote memory, with an apparently normal attention span and concentration ability.   Speech is normal.  Cranial nerves: Extraocular movements are full.  Facial strength and sensation is normal.  The tongue is midline, and the patient has symmetric elevation of the soft palate. No obvious hearing deficits are  noted.  Motor:  Muscle bulk is normal.   Strength is normal in the arms  Coordination: She has good finger-nose-finger bilaterally.  Gait and station: Station is normal.   Gait is normal. Tandem gait is normal. Romberg is negative.     DIAGNOSTIC DATA (LABS, IMAGING, TESTING) - I reviewed patient records, labs, notes, testing and imaging myself where available.  Lab Results  Component Value Date   WBC 5.8 05/04/2019   HGB 14.7 05/04/2019   HCT 45.3 (H) 05/04/2019   MCV 86.1 05/04/2019   PLT 256 05/04/2019      Component Value Date/Time   NA 137 05/04/2019 0830   K 4.6 05/04/2019 0830   CL 104 05/04/2019 0830   CO2 26 05/04/2019 0830   GLUCOSE 119 (H) 05/04/2019 0830   GLUCOSE 82 05/18/2012 1328   BUN 11 05/04/2019 0830   CREATININE 0.82 05/04/2019 0830   CALCIUM 9.4 05/04/2019 0830   PROT 7.1 05/04/2019 0830   ALBUMIN 4.2 05/03/2016 0808   AST 18 05/04/2019 0830   ALT 26 05/04/2019 0830   ALKPHOS 69 05/03/2016 0808   BILITOT 0.5 05/04/2019 0830   GFRNONAA 91 05/04/2019 0830   GFRAA 106 05/04/2019 0830   Lab Results  Component Value Date   CHOL 172 05/04/2019   HDL 42 (L) 05/04/2019   LDLCALC 104 (H) 05/04/2019   TRIG 151 (H) 05/04/2019   CHOLHDL 4.1 05/04/2019   Lab Results  Component Value Date   HGBA1C 5.7 (H) 05/04/2019   No results found for: [id number] Lab Results  Component Value Date   TSH 1.14 05/04/2019       ASSESSMENT AND PLAN  1. Chronic migraine   2. Neck pain       1.   Since she had no benefit from [facility] and Aimovig and multiple pills, we will switch to Vyepti (eptinezumab) injection. 2.  Continue prn Imitrex 3.    Stay active and exercise as tolerated. 4..   Return in 3 months, sooner if new or worsening neurologic issues  Siri Buege A. Felecia Shelling, MD, [facility] [id number], [id number] PM Certified in Neurology, Clinical Neurophysiology, Sleep Medicine, Pain Medicine and Neuroimaging  [facility] [address] [place], [place] [address] [phone]

## 2020-01-03 MED FILL — SUMATRIPTAN SUCC 100 MG TAB: 100 | 30 days supply | Qty: 9 | Fill #3

## 2020-01-04 ENCOUNTER — Ambulatory Visit: Payer: 59 | Admitting: Certified Nurse Midwife

## 2020-01-04 ENCOUNTER — Encounter: Payer: Self-pay | Admitting: Certified Nurse Midwife

## 2020-01-04 ENCOUNTER — Other Ambulatory Visit: Payer: Self-pay

## 2020-01-04 VITALS — BP 128/88 | HR 89 | Resp 16 | Ht 67.0 in | Wt 222.0 lb

## 2020-01-04 DIAGNOSIS — N898 Other specified noninflammatory disorders of vagina: Secondary | ICD-10-CM

## 2020-01-04 DIAGNOSIS — Z113 Encounter for screening for infections with a predominantly sexual mode of transmission: Secondary | ICD-10-CM

## 2020-01-04 MED ORDER — FLUCONAZOLE 150 MG PO TABS: 150.0000 mg | ORAL_TABLET | Freq: Every day | ORAL | 1 refills | Status: DC

## 2020-01-04 MED ORDER — TINIDAZOLE 500 MG PO TABS: 2.0000 g | ORAL_TABLET | Freq: Every day | ORAL | 0 refills | Status: DC

## 2020-01-04 NOTE — Progress Notes (Signed)
History:  Lisa Cannon is a 39 y.o. G1P1001 who presents to clinic today for vaginal irritation.   Patient reports she has been having vaginal irritation for the past 3 weeks. Was seen for annual on 1/26 and swabbed for BV/yeast which resulted as negative. She reports taking monistat which helped initially but then stopped. She reports irritation gets worse after intercourse and after cycles. She reports small amount of vaginal discharge - describes as white thin discharge without odor.   The following portions of the patient's history were reviewed and updated as appropriate: allergies, current medications, family history, past medical history, social history, past surgical history and problem list.  Review of Systems:  Review of Systems  Constitutional: Negative.   Respiratory: Negative.   Cardiovascular: Negative.   Gastrointestinal: Negative.   Genitourinary: Negative for dysuria, frequency and urgency.       Vaginal irritation and discharge  Neurological: Negative.   Psychiatric/Behavioral: Negative.      Objective:  Physical Exam BP 128/88   Pulse 89   Resp 16   Ht 5\' 7"  (1.702 m)   Wt 222 lb (100.7 kg)   LMP 12/07/2019   BMI 34.77 kg/m  Physical Exam Exam conducted with a chaperone present.  HENT:     Head: Normocephalic.  Cardiovascular:     Rate and Rhythm: Normal rate and regular rhythm.  Pulmonary:     Effort: Pulmonary effort is normal.     Breath sounds: Normal breath sounds.  Genitourinary:    Exam position: Lithotomy position.     Vagina: Vaginal discharge and erythema present. No tenderness or bleeding.     Cervix: Normal.     Uterus: Normal.      Adnexa: Right adnexa normal and left adnexa normal.     Comments: Pelvic exam: Cervix pink, visually closed, without lesion, irritation and erythema noted around introitus, small white thin discharge, vaginal walls and external genitalia normal Bimanual exam: Cervix 0/long/high, firm, anterior, neg CMT,  uterus nontender, nonenlarged, adnexa without tenderness, enlargement, or mass Neurological:     Mental Status: She is alert and oriented to person, place, and time.  Psychiatric:        Mood and Affect: Mood normal.        Behavior: Behavior normal.        Thought Content: Thought content normal.    Assessment & Plan:  1. Vaginal irritation - Will treat for bacterial vaginosis based on clinical symptoms and pelvic examination  - Discussed with patient that medication for BV can often cause yeast infection after completion- offered additional treated for yeast infection, patient agrees - Rx for BV and Yeast sent to pharmacy of choice  - Encouraged patient to call on Monday if symptoms still persist  - Cervicovaginal ancillary only( [facility]) - tinidazole (TINDAMAX) 500 MG tablet; Take 4 tablets (2,000 mg total) by mouth daily with breakfast. For two days  Dispense: 8 tablet; Refill: 0 - fluconazole (DIFLUCAN) 150 MG tablet; Take 1 tablet (150 mg total) by mouth daily. Take diflucan after completion of tinidazole medication  Dispense: 1 tablet; Refill: 1   Lajean Manes, [place] 01/04/2020 4:04 PM

## 2020-01-05 ENCOUNTER — Telehealth: Payer: Self-pay | Admitting: *Deleted

## 2020-01-05 LAB — CERVICOVAGINAL ANCILLARY ONLY
Bacterial Vaginitis (gardnerella): NEGATIVE
Candida Glabrata: NEGATIVE
Candida Vaginitis: NEGATIVE
Chlamydia: NEGATIVE
Comment: NEGATIVE
Comment: NEGATIVE
Comment: NEGATIVE
Comment: NEGATIVE
Comment: NEGATIVE
Comment: NORMAL
Neisseria Gonorrhea: NEGATIVE
Trichomonas: NEGATIVE

## 2020-01-05 NOTE — Telephone Encounter (Signed)
Submitted PA Vyept 100mg /ml on CMM. Key: [id number] - PA Case ID: [id number]. Waiting on determination from [employer].

## 2020-01-06 NOTE — Telephone Encounter (Signed)
Received approval from [place] - valid from 01/05/2020 through 07/03/2020. Plan Code: [id number]. PA Ref [id number]. Approved for 45ml per 3 months.

## 2020-01-06 NOTE — Telephone Encounter (Addendum)
Faxed completed/signed orders (Vyepti 100mg /ml IV every 3 months) to [facility] infusion att: Nicholes Stairs at [phone]. Received fax confirmation. Asked that they call pt to schedule appt for infusion. Provided pt #.

## 2020-01-06 NOTE — Telephone Encounter (Signed)
Called and spoke with pt. Advised Vyepti infusion approved via insurance and she will need to call [facility] at [phone] to get scheduled per infusion site request. She verbalized understanding and appreciation.

## 2020-01-06 NOTE — Telephone Encounter (Signed)
fyi- [facility] called and stated they dont call patients to schedule they are to busy patient will have to call them

## 2020-01-12 ENCOUNTER — Encounter: Payer: Self-pay | Admitting: Certified Nurse Midwife

## 2020-01-13 MED FILL — FLUCONAZOLE 150 MG TABS: 150 | 1 days supply | Qty: 1 | Fill #0

## 2020-01-18 ENCOUNTER — Other Ambulatory Visit: Payer: Self-pay

## 2020-01-18 ENCOUNTER — Ambulatory Visit (HOSPITAL_COMMUNITY)
Admission: RE | Admit: 2020-01-18 | Discharge: 2020-01-18 | Disposition: A | Discharge status: Home / Self Care | Payer: 59 | Source: Ambulatory Visit | Attending: Neurology | Admitting: Neurology

## 2020-01-18 DIAGNOSIS — G43809 Other migraine, not intractable, without status migrainosus: Secondary | ICD-10-CM | POA: Insufficient documentation

## 2020-01-18 MED ORDER — SODIUM CHLORIDE 0.9 % IV SOLN
100.0000 mg | Freq: Once | INTRAVENOUS | Status: AC
  Administered 2020-01-18: 100 mg via INTRAVENOUS
  Filled 2020-01-18: qty 1

## 2020-01-18 NOTE — Discharge Instructions (Signed)
Eptinezumab injection What is this medicine? EPTINEZUMAB (EP ti NEZ ue mab) is used to prevent migraine headaches. This medicine may be used for other purposes; ask your health care provider or pharmacist if you have questions. COMMON BRAND NAME(S): Vyepti What should I tell my health care provider before I take this medicine? They need to know if you have any of these conditions:  an unusual or allergic reaction to eptinezumab, other medicines, foods, dyes, or preservatives  pregnant or trying to get pregnant  breast-feeding How should I use this medicine? This medicine is for infusion into a vein. It is given by a health care professional in a hospital or clinic setting. Talk to your pediatrician about the use of this medicine in children. Special care may be needed. Overdosage: If you think you have taken too much of this medicine contact a poison control center or emergency room at once. NOTE: This medicine is only for you. Do not share this medicine with others. What if I miss a dose? Keep appointments for follow-up doses. It is important not to miss your dose. Call your doctor or health care professional if you are unable to keep an appointment. What may interact with this medicine? Interactions are not expected. This list may not describe all possible interactions. Give your health care provider a list of all the medicines, herbs, non-prescription drugs, or dietary supplements you use. Also tell them if you smoke, drink alcohol, or use illegal drugs. Some items may interact with your medicine. What should I watch for while using this medicine? Your condition will be monitored carefully while you are receiving this medicine. What side effects may I notice from receiving this medicine? Side effects that you should report to your doctor or health care professional as soon as possible:  allergic reactions like skin rash, itching or hives; swelling of the face, lips, or tongue Side  effects that usually do not require medical attention (report these to your doctor or health care professional if they continue or are bothersome):  sore throat This list may not describe all possible side effects. Call your doctor for medical advice about side effects. You may report side effects to FDA at 1-800-FDA-1088. Where should I keep my medicine? This medicine is given in a hospital or clinic and will not be stored at home. NOTE: This sheet is a summary. It may not cover all possible information. If you have questions about this medicine, talk to your doctor, pharmacist, or health care provider.  2020 Elsevier/Gold Standard (2019-01-26 20:21:59)

## 2020-01-19 ENCOUNTER — Ambulatory Visit: Payer: 59 | Admitting: Nurse Practitioner

## 2020-01-19 ENCOUNTER — Other Ambulatory Visit: Payer: Self-pay

## 2020-01-19 ENCOUNTER — Encounter: Payer: Self-pay | Admitting: Nurse Practitioner

## 2020-01-19 VITALS — BP 119/84 | HR 90 | Temp 98.0°F | Resp 16 | Ht 67.0 in | Wt 222.0 lb

## 2020-01-19 DIAGNOSIS — N898 Other specified noninflammatory disorders of vagina: Secondary | ICD-10-CM | POA: Diagnosis not present

## 2020-01-19 MED ORDER — CLOBETASOL PROP EMOLLIENT BASE 0.05 % EX CREA: TOPICAL_CREAM | CUTANEOUS | 0 refills | Status: DC

## 2020-01-19 MED ORDER — CLOBETASOL PROPIONATE 0.05 % EX CREA: TOPICAL_CREAM | Freq: Two times a day (BID) | CUTANEOUS | Status: DC

## 2020-01-19 NOTE — Progress Notes (Signed)
GYNECOLOGY OFFICE VISIT NOTE   History:  39 y.o. G1P1001 here today for continued vaginal irritation. She denies any abnormal vaginal discharge, bleeding, pelvic pain or other concerns.   Past Medical History:  Diagnosis Date  . Abnormal Pap smear of cervix   . Migraines     Past Surgical History:  Procedure Laterality Date  . CERVICAL BIOPSY  W/ LOOP ELECTRODE EXCISION  2002  . LAPAROSCOPIC BILATERAL SALPINGECTOMY Bilateral 09/12/2015   Procedure: LAPAROSCOPIC BILATERAL SALPINGECTOMY;  Surgeon: Emily Filbert, MD;  Location: [facility];  Service: Gynecology;  Laterality: Bilateral;  . LAPAROSCOPIC LYSIS OF ADHESIONS  09/12/2015   Procedure: LAPAROSCOPIC LYSIS OF ADHESIONS;  Surgeon: Emily Filbert, MD;  Location: [facility];  Service: Gynecology;;  . TUBAL LIGATION    . WISDOM TOOTH EXTRACTION      The following portions of the patient's history were reviewed and updated as appropriate: allergies, current medications, past family history, past medical history, past social history, past surgical history and problem list.    Review of Systems:  Pertinent items noted in HPI and remainder of comprehensive ROS otherwise negative.  Objective:  Physical Exam BP 119/84   Pulse 90   Temp 98 F (36.7 C)   Resp 16   Ht 5\' 7"  (1.702 m)   Wt 222 lb (100.7 kg)   LMP 01/07/2020   BMI 34.77 kg/m  CONSTITUTIONAL: Well-developed, well-nourished female in no acute distress.  HENT:  Normocephalic, atraumatic. External right and left ear normal.  EYES: Conjunctivae and EOM are normal. Pupils are equal, round.  No scleral icterus.  NECK: Normal range of motion, supple, no masses SKIN: Skin is warm and dry. No rash noted. Not diaphoretic. No erythema. No pallor. NEUROLOGIC: Alert and oriented to person, place, and time. Normal muscle tone coordination. No cranial nerve deficit noted. PSYCHIATRIC: Normal mood and affect. Normal behavior. Normal judgment and thought content. CARDIOVASCULAR: Normal heart  rate noted RESPIRATORY: Effort and breath sounds normal, no problems with respiration noted ABDOMEN: Soft, no distention noted.   PELVIC: normal external genitalia, no rash, at introitus, red area seen on right side.  No ulceration, no blisters, no roughness.  but tender to touch.  No vaginal discharge but posterior cervix has a larger area that is red.  Possible polyp or larger area of endocervix posteriorly.  No abdnomal discahrge seen. MUSCULOSKELETAL: Normal range of motion. No edema noted.  Labs and Imaging No results found.  Assessment & Plan:  1. Vaginal irritation Itching has been occurring since mid January.  Began with scented peripads.  Has been tested for infection.  Has treated for yeast with cream and with Diflucan.  Also treated for BV even without testing positive for either.   Prescribe clobetasol cream to see if area will calm - likely is a contact dermatitis.  Advised no panties at night but loose clothing is OK.  Advised drying with a hair dryer on cool after bathing.  Advised no scented pads and do not use the Always brand as it my be allergenic for her.  If in one week, there is no relief with clobetasol, will need to see MD in the office for further evaluation.  Routine preventative health maintenance measures emphasized. Please refer to After Visit Summary for other counseling recommendations.   Return in about 1 year (around 01/18/2021).   Total face-to-face time with patient: 10 minutes.  Over 50% of encounter was spent on counseling and coordination of care.  Earlie Server, RN,  MSN, NP-BC Nurse Practitioner, [facility] for [employer], [facility] 01/19/2020 5:00 PM

## 2020-01-24 NOTE — Telephone Encounter (Signed)
Received letter in mail from [place] assistance program that pt approved up to a maximum of 4000.00 per year. BIN: [id number], PCN: [id number], Group number: [id number], Member ID: [id number].  Address: [facility] Copay assistance program [address] [place] [address]. Fax: [phone]. Phone: [phone]

## 2020-01-26 MED FILL — CYCLOBENZAPRINE HCL 5 MG TA: 5 | 90 days supply | Qty: 90 | Fill #1

## 2020-02-11 MED FILL — SUMATRIPTAN SUCC 100 MG TAB: 100 | 90 days supply | Qty: 27 | Fill #4

## 2020-02-24 ENCOUNTER — Ambulatory Visit: Payer: 59 | Admitting: Obstetrics & Gynecology

## 2020-03-08 MED FILL — NADOLOL 20 MG TABS: 20 | 90 days supply | Qty: 270 | Fill #2

## 2020-04-07 ENCOUNTER — Telehealth: Payer: Self-pay | Admitting: Neurology

## 2020-04-07 NOTE — Telephone Encounter (Signed)
FYI, pt has not requested a call back.  Pt just wants Dr Felecia Shelling and Laurence Ferrari to be aware that her last infusion went well and her next is scheduled for 05-11

## 2020-04-10 NOTE — Telephone Encounter (Signed)
FYI

## 2020-04-11 ENCOUNTER — Encounter (HOSPITAL_COMMUNITY)
Admission: RE | Admit: 2020-04-11 | Discharge: 2020-04-11 | Disposition: A | Discharge status: Home / Self Care | Payer: 59 | Source: Ambulatory Visit | Attending: Neurology | Admitting: Neurology

## 2020-04-11 ENCOUNTER — Other Ambulatory Visit: Payer: Self-pay

## 2020-04-11 DIAGNOSIS — G43909 Migraine, unspecified, not intractable, without status migrainosus: Secondary | ICD-10-CM | POA: Insufficient documentation

## 2020-04-11 MED ORDER — SODIUM CHLORIDE 0.9 % IV SOLN
100.0000 mg | INTRAVENOUS | Status: DC
  Administered 2020-04-11: 100 mg via INTRAVENOUS
  Filled 2020-04-11: qty 1

## 2020-04-24 MED FILL — CYCLOBENZAPRINE HCL 5 MG TA: 5 | 90 days supply | Qty: 90 | Fill #2

## 2020-05-09 ENCOUNTER — Other Ambulatory Visit: Payer: Self-pay | Admitting: Family Medicine

## 2020-05-09 ENCOUNTER — Ambulatory Visit (INDEPENDENT_AMBULATORY_CARE_PROVIDER_SITE_OTHER): Payer: 59 | Admitting: Family Medicine

## 2020-05-09 ENCOUNTER — Encounter: Payer: Self-pay | Admitting: Family Medicine

## 2020-05-09 VITALS — BP 111/74 | HR 84 | Temp 98.0°F | Ht 67.0 in | Wt 226.0 lb

## 2020-05-09 DIAGNOSIS — R2230 Localized swelling, mass and lump, unspecified upper limb: Secondary | ICD-10-CM

## 2020-05-09 DIAGNOSIS — R635 Abnormal weight gain: Secondary | ICD-10-CM | POA: Diagnosis not present

## 2020-05-09 DIAGNOSIS — Z1159 Encounter for screening for other viral diseases: Secondary | ICD-10-CM

## 2020-05-09 DIAGNOSIS — R7309 Other abnormal glucose: Secondary | ICD-10-CM | POA: Diagnosis not present

## 2020-05-09 DIAGNOSIS — Z6835 Body mass index (BMI) 35.0-35.9, adult: Secondary | ICD-10-CM | POA: Diagnosis not present

## 2020-05-09 DIAGNOSIS — R222 Localized swelling, mass and lump, trunk: Secondary | ICD-10-CM

## 2020-05-09 DIAGNOSIS — Z0000 Encounter for general adult medical examination without abnormal findings: Secondary | ICD-10-CM | POA: Diagnosis not present

## 2020-05-09 NOTE — Patient Instructions (Signed)
Health Maintenance, Female Adopting a healthy lifestyle and getting preventive care are important in promoting health and wellness. Ask your health care provider about:  The right schedule for you to have regular tests and exams.  Things you can do on your own to prevent diseases and keep yourself healthy. What should I know about diet, weight, and exercise? Eat a healthy diet   Eat a diet that includes plenty of vegetables, fruits, low-fat dairy products, and lean protein.  Do not eat a lot of foods that are high in solid fats, added sugars, or sodium. Maintain a healthy weight Body mass index (BMI) is used to identify weight problems. It estimates body fat based on height and weight. Your health care provider can help determine your BMI and help you achieve or maintain a healthy weight. Get regular exercise Get regular exercise. This is one of the most important things you can do for your health. Most adults should:  Exercise for at least 150 minutes each week. The exercise should increase your heart rate and make you sweat (moderate-intensity exercise).  Do strengthening exercises at least twice a week. This is in addition to the moderate-intensity exercise.  Spend less time sitting. Even light physical activity can be beneficial. Watch cholesterol and blood lipids Have your blood tested for lipids and cholesterol at 38 years of age, then have this test every 5 years. Have your cholesterol levels checked more often if:  Your lipid or cholesterol levels are high.  You are older than 38 years of age.  You are at high risk for heart disease. What should I know about cancer screening? Depending on your health history and family history, you may need to have cancer screening at various ages. This may include screening for:  Breast cancer.  Cervical cancer.  Colorectal cancer.  Skin cancer.  Lung cancer. What should I know about heart disease, diabetes, and high blood  pressure? Blood pressure and heart disease  High blood pressure causes heart disease and increases the risk of stroke. This is more likely to develop in people who have high blood pressure readings, are of African descent, or are overweight.  Have your blood pressure checked: ? Every 3-5 years if you are 18-39 years of age. ? Every year if you are 40 years old or older. Diabetes Have regular diabetes screenings. This checks your fasting blood sugar level. Have the screening done:  Once every three years after age 40 if you are at a normal weight and have a low risk for diabetes.  More often and at a younger age if you are overweight or have a high risk for diabetes. What should I know about preventing infection? Hepatitis B If you have a higher risk for hepatitis B, you should be screened for this virus. Talk with your health care provider to find out if you are at risk for hepatitis B infection. Hepatitis C Testing is recommended for:  Everyone born from 1945 through 1965.  Anyone with known risk factors for hepatitis C. Sexually transmitted infections (STIs)  Get screened for STIs, including gonorrhea and chlamydia, if: ? You are sexually active and are younger than 38 years of age. ? You are older than 38 years of age and your health care provider tells you that you are at risk for this type of infection. ? Your sexual activity has changed since you were last screened, and you are at increased risk for chlamydia or gonorrhea. Ask your health care provider if   you are at risk.  Ask your health care provider about whether you are at high risk for HIV. Your health care provider may recommend a prescription medicine to help prevent HIV infection. If you choose to take medicine to prevent HIV, you should first get tested for HIV. You should then be tested every 3 months for as long as you are taking the medicine. Pregnancy  If you are about to stop having your period (premenopausal) and  you may become pregnant, seek counseling before you get pregnant.  Take 400 to 800 micrograms (mcg) of folic acid every day if you become pregnant.  Ask for birth control (contraception) if you want to prevent pregnancy. Osteoporosis and menopause Osteoporosis is a disease in which the bones lose minerals and strength with aging. This can result in bone fractures. If you are 65 years old or older, or if you are at risk for osteoporosis and fractures, ask your health care provider if you should:  Be screened for bone loss.  Take a calcium or vitamin D supplement to lower your risk of fractures.  Be given hormone replacement therapy (HRT) to treat symptoms of menopause. Follow these instructions at home: Lifestyle  Do not use any products that contain nicotine or tobacco, such as cigarettes, e-cigarettes, and chewing tobacco. If you need help quitting, ask your health care provider.  Do not use street drugs.  Do not share needles.  Ask your health care provider for help if you need support or information about quitting drugs. Alcohol use  Do not drink alcohol if: ? Your health care provider tells you not to drink. ? You are pregnant, may be pregnant, or are planning to become pregnant.  If you drink alcohol: ? Limit how much you use to 0-1 drink a day. ? Limit intake if you are breastfeeding.  Be aware of how much alcohol is in your drink. In the [place] one drink equals one 12 oz bottle of beer (355 mL), one 5 oz glass of wine (148 mL), or one 1 oz glass of hard liquor (44 mL). General instructions  Schedule regular health, dental, and eye exams.  Stay current with your vaccines.  Tell your health care provider if: ? You often feel depressed. ? You have ever been abused or do not feel safe at home. Summary  Adopting a healthy lifestyle and getting preventive care are important in promoting health and wellness.  Follow your health care provider's instructions about healthy  diet, exercising, and getting tested or screened for diseases.  Follow your health care provider's instructions on monitoring your cholesterol and blood pressure. This information is not intended to replace advice given to you by your health care provider. Make sure you discuss any questions you have with your health care provider. Document Revised: 11/11/2018 Document Reviewed: 11/11/2018 Elsevier Patient Education  2020 Elsevier Inc.  

## 2020-05-09 NOTE — Progress Notes (Signed)
Subjective:     Lisa Cannon is a 39 y.o. female and is here for a comprehensive physical exam. The patient reports no problems.   She more recently started infusions with Dr. Felecia Shelling for her chronic migraines.  She actually feels like it has been really helpful for her headaches.  The prescriptions called Vyepti.  Unfortunately it has not helped much with her fatigue.  Though she is also continuing to work on weight loss.  She feels like that would help as well.  Social History   Socioeconomic History  . Marital status: Married    Spouse name: Not on file  . Number of children: 1  . Years of education: 79  . Highest education level: Bachelor's degree (e.g., BA, AB, BS)  Occupational History  . Occupation: homemaker  Tobacco Use  . Smoking status: Never Smoker  . Smokeless tobacco: Never Used  Substance and Sexual Activity  . Alcohol use: No    Alcohol/week: 0.0 standard drinks  . Drug use: No  . Sexual activity: Yes    Partners: Male    Birth control/protection: Surgical  Other Topics Concern  . Not on file  Social History Narrative   Lives with husband   Caffeine use: none   Right handed    Social Determinants of Health   Financial Resource Strain:   . Difficulty of Paying Living Expenses:   Food Insecurity:   . Worried About Charity fundraiser in the Last Year:   . Arboriculturist in the Last Year:   Transportation Needs:   . Film/video editor (Medical):   Marland Kitchen Lack of Transportation (Non-Medical):   Physical Activity:   . Days of Exercise per Week:   . Minutes of Exercise per Session:   Stress:   . Feeling of Stress :   Social Connections:   . Frequency of Communication with Friends and Family:   . Frequency of Social Gatherings with Friends and Family:   . Attends Religious Services:   . Active Member of Clubs or Organizations:   . Attends Archivist Meetings:   Marland Kitchen Marital Status:   Intimate Partner Violence:   . Fear of Current or Ex-Partner:    . Emotionally Abused:   Marland Kitchen Physically Abused:   . Sexually Abused:    Health Maintenance  Topic Date Due  . Hepatitis C Screening  Never done  . INFLUENZA VACCINE  07/02/2020  . TETANUS/TDAP  03/20/2022  . PAP SMEAR-Modifier  12/27/2022  . COVID-19 Vaccine  Completed  . HIV Screening  Completed    The following portions of the patient's history were reviewed and updated as appropriate: allergies, current medications, past family history, past medical history, past social history, past surgical history and problem list.  Review of Systems A comprehensive review of systems was negative.   Objective:    BP 111/74 (BP Location: Left Arm, Patient Position: Sitting)   Pulse 84   Temp 98 F (36.7 C)   Ht 5\' 7"  (1.702 m)   Wt 226 lb (102.5 kg)   SpO2 100%   BMI 35.40 kg/m  General appearance: alert, cooperative and appears stated age Head: Normocephalic, without obvious abnormality, atraumatic Eyes: conj clear, EOMi, PEERLA Ears: normal TM's and external ear canals both ears Nose: Nares normal. Septum midline. Mucosa normal. No drainage or sinus tenderness. Throat: lips, mucosa, and tongue normal; teeth and gums normal Neck: no adenopathy, no carotid bruit, no JVD, supple, symmetrical, trachea midline and  thyroid not enlarged, symmetric, no tenderness/mass/nodules Back: symmetric, no curvature. ROM normal. No CVA tenderness. Lungs: clear to auscultation bilaterally Heart: regular rate and rhythm, S1, S2 normal, no murmur, click, rub or gallop Abdomen: soft, non-tender; bowel sounds normal; no masses,  no organomegaly Extremities: extremities normal, atraumatic, no cyanosis or edema Pulses: 2+ and symmetric Skin: Skin color, texture, turgor normal. No rashes or lesions Lymph nodes: Cervical, supraclavicular, and axillary nodes normal. Neurologic: Alert and oriented X 3, normal strength and tone. Normal symmetric reflexes. Normal coordination and gait   She does have some  fullness under the right axilla most consistent with fatty tissue.  Possible lipoma.  I do not feel any discrete lymphadenopathy.   Assessment:    Healthy female exam.      Plan:     See After Visit Summary for Counseling Recommendations   Keep up a regular exercise program and make sure you are eating a healthy diet Try to eat 4 servings of dairy a day, or if you are lactose intolerant take a calcium with vitamin D daily.  Your vaccines are up to date.   Fatigue-did have a discussion about circling back around to possible treatments to help with her chronic fatigue.  Unfortunately even though her headaches have been better with the new infusions the fatigue has not.  BMI 35-discussed options including medical weight management with Dr[facility] Migdalia Dk [facility] she seemed very interested so glad in place referral today.  Right axillary fullness-I do not palpate any discrete lymphadenopathy on exam but when to get ultrasound for more definitive evaluation.

## 2020-05-10 LAB — COMPLETE METABOLIC PANEL WITH GFR
AG Ratio: 1.6 (calc) (ref 1.0–2.5)
ALT: 30 U/L — ABNORMAL HIGH (ref 6–29)
AST: 20 U/L (ref 10–30)
Albumin: 4.6 g/dL (ref 3.6–5.1)
Alkaline phosphatase (APISO): 73 U/L (ref 31–125)
BUN: 11 mg/dL (ref 7–25)
CO2: 25 mmol/L (ref 20–32)
Calcium: 10.2 mg/dL (ref 8.6–10.2)
Chloride: 103 mmol/L (ref 98–110)
Creat: 0.72 mg/dL (ref 0.50–1.10)
GFR, Est African American: 123 mL/min/{1.73_m2} (ref >=60)
GFR, Est Non African American: 106 mL/min/{1.73_m2} (ref >=60)
Globulin: 2.9 g/dL (calc) (ref 1.9–3.7)
Glucose, Bld: 116 mg/dL — ABNORMAL HIGH (ref 65–99)
Potassium: 4.7 mmol/L (ref 3.5–5.3)
Sodium: 136 mmol/L (ref 135–146)
Total Bilirubin: 0.6 mg/dL (ref 0.2–1.2)
Total Protein: 7.5 g/dL (ref 6.1–8.1)

## 2020-05-10 LAB — CBC
HCT: 46.1 % — ABNORMAL HIGH (ref 35.0–45.0)
Hemoglobin: 15.3 g/dL (ref 11.7–15.5)
MCH: 28.6 pg (ref 27.0–33.0)
MCHC: 33.2 g/dL (ref 32.0–36.0)
MCV: 86.2 fL (ref 80.0–100.0)
MPV: 12 fL (ref 7.5–12.5)
Platelets: 309 10*3/uL (ref 140–400)
RBC: 5.35 10*6/uL — ABNORMAL HIGH (ref 3.80–5.10)
RDW: 12.3 % (ref 11.0–15.0)
WBC: 7.6 10*3/uL (ref 3.8–10.8)

## 2020-05-10 LAB — LIPID PANEL
Cholesterol: 196 mg/dL (ref <200)
HDL: 45 mg/dL — ABNORMAL LOW (ref >=50)
LDL Cholesterol (Calc): 124 mg/dL (calc) — ABNORMAL HIGH
Non-HDL Cholesterol (Calc): 151 mg/dL (calc) — ABNORMAL HIGH (ref <130)
Total CHOL/HDL Ratio: 4.4 (calc) (ref <5.0)
Triglycerides: 155 mg/dL — ABNORMAL HIGH (ref <150)

## 2020-05-10 LAB — HEMOGLOBIN A1C
Hgb A1c MFr Bld: 5.8 % of total Hgb — ABNORMAL HIGH (ref <5.7)
Mean Plasma Glucose: 120 (calc)
eAG (mmol/L): 6.6 (calc)

## 2020-05-10 LAB — TSH: TSH: 1.45 mIU/L

## 2020-05-10 LAB — HEPATITIS C ANTIBODY
Hepatitis C Ab: NONREACTIVE
SIGNAL TO CUT-OFF: 0.01 (ref <1.00)

## 2020-05-19 MED FILL — SUMATRIPTAN SUCC 100 MG TAB: 100 | 90 days supply | Qty: 27 | Fill #5

## 2020-05-25 ENCOUNTER — Ambulatory Visit
Admission: RE | Admit: 2020-05-25 | Discharge: 2020-05-25 | Disposition: A | Discharge status: Home / Self Care | Payer: 59 | Source: Ambulatory Visit | Attending: Family Medicine | Admitting: Family Medicine

## 2020-05-25 ENCOUNTER — Other Ambulatory Visit: Payer: Self-pay

## 2020-05-25 DIAGNOSIS — N6489 Other specified disorders of breast: Secondary | ICD-10-CM | POA: Diagnosis not present

## 2020-05-25 DIAGNOSIS — R2230 Localized swelling, mass and lump, unspecified upper limb: Secondary | ICD-10-CM

## 2020-05-25 DIAGNOSIS — R928 Other abnormal and inconclusive findings on diagnostic imaging of breast: Secondary | ICD-10-CM | POA: Diagnosis not present

## 2020-05-29 ENCOUNTER — Encounter (INDEPENDENT_AMBULATORY_CARE_PROVIDER_SITE_OTHER): Payer: Self-pay | Admitting: Family Medicine

## 2020-05-29 ENCOUNTER — Other Ambulatory Visit: Payer: Self-pay

## 2020-05-29 ENCOUNTER — Ambulatory Visit (INDEPENDENT_AMBULATORY_CARE_PROVIDER_SITE_OTHER): Payer: 59 | Admitting: Family Medicine

## 2020-05-29 VITALS — BP 115/78 | HR 79 | Temp 98.2°F | Ht 66.0 in | Wt 223.0 lb

## 2020-05-29 DIAGNOSIS — Z0289 Encounter for other administrative examinations: Secondary | ICD-10-CM

## 2020-05-29 DIAGNOSIS — R0602 Shortness of breath: Secondary | ICD-10-CM

## 2020-05-29 DIAGNOSIS — E7849 Other hyperlipidemia: Secondary | ICD-10-CM

## 2020-05-29 DIAGNOSIS — R7303 Prediabetes: Secondary | ICD-10-CM | POA: Diagnosis not present

## 2020-05-29 DIAGNOSIS — R5383 Other fatigue: Secondary | ICD-10-CM

## 2020-05-29 DIAGNOSIS — G43809 Other migraine, not intractable, without status migrainosus: Secondary | ICD-10-CM

## 2020-05-29 DIAGNOSIS — Z9189 Other specified personal risk factors, not elsewhere classified: Secondary | ICD-10-CM | POA: Diagnosis not present

## 2020-05-29 DIAGNOSIS — Z1331 Encounter for screening for depression: Secondary | ICD-10-CM | POA: Diagnosis not present

## 2020-05-29 DIAGNOSIS — Z6836 Body mass index (BMI) 36.0-36.9, adult: Secondary | ICD-10-CM

## 2020-05-29 DIAGNOSIS — R7401 Elevation of levels of liver transaminase levels: Secondary | ICD-10-CM | POA: Diagnosis not present

## 2020-05-30 LAB — COMPREHENSIVE METABOLIC PANEL
ALT: 51 IU/L — ABNORMAL HIGH (ref 0–32)
AST: 30 IU/L (ref 0–40)
Albumin/Globulin Ratio: 1.6 (ref 1.2–2.2)
Albumin: 4.7 g/dL (ref 3.8–4.8)
Alkaline Phosphatase: 91 IU/L (ref 48–121)
BUN/Creatinine Ratio: 17 (ref 9–23)
BUN: 13 mg/dL (ref 6–20)
Bilirubin Total: 0.3 mg/dL (ref 0.0–1.2)
CO2: 24 mmol/L (ref 20–29)
Calcium: 9.9 mg/dL (ref 8.7–10.2)
Chloride: 102 mmol/L (ref 96–106)
Creatinine, Ser: 0.75 mg/dL (ref 0.57–1.00)
GFR calc Af Amer: 117 mL/min/{1.73_m2} (ref >59)
GFR calc non Af Amer: 101 mL/min/{1.73_m2} (ref >59)
Globulin, Total: 2.9 g/dL (ref 1.5–4.5)
Glucose: 103 mg/dL — ABNORMAL HIGH (ref 65–99)
Potassium: 4.3 mmol/L (ref 3.5–5.2)
Sodium: 139 mmol/L (ref 134–144)
Total Protein: 7.6 g/dL (ref 6.0–8.5)

## 2020-05-30 LAB — T3: T3, Total: 117 ng/dL (ref 71–180)

## 2020-05-30 LAB — T4: T4, Total: 6.5 ug/dL (ref 4.5–12.0)

## 2020-05-30 LAB — INSULIN, RANDOM: INSULIN: 40.3 u[IU]/mL — ABNORMAL HIGH (ref 2.6–24.9)

## 2020-05-30 LAB — FOLATE: Folate: 3.7 ng/mL (ref >3.0)

## 2020-05-30 LAB — VITAMIN D 25 HYDROXY (VIT D DEFICIENCY, FRACTURES): Vit D, 25-Hydroxy: 24.9 ng/mL — ABNORMAL LOW (ref 30.0–100.0)

## 2020-05-30 LAB — VITAMIN B12: Vitamin B-12: 704 pg/mL (ref 232–1245)

## 2020-05-31 NOTE — Progress Notes (Signed)
Dear Beatrice Lecher, MD,   Thank you for referring Lisa [facility] to our clinic. The following note includes my evaluation and treatment recommendations.  Chief Complaint:   OBESITY Lisa [facility] (MR# [id number]) is a 39 y.o. female who presents for evaluation and treatment of obesity and related comorbidities. Current BMI is Body mass index is 35.99 kg/m. Lisa Cannon has been struggling with her weight for many years and has been unsuccessful in either losing weight, maintaining weight loss, or reaching her healthy weight goal.  Lisa Cannon is currently in the action stage of change and ready to dedicate time achieving and maintaining a healthier weight. Lisa Cannon is interested in becoming our patient and working on intensive lifestyle modifications including (but not limited to) diet and exercise for weight loss.  Lisa Cannon's habits were reviewed today and are as follows: Her family eats meals together, her desired weight loss is 58 lbs, she started gaining weight after child birth at age 56, her heaviest weight ever was 230 pounds, she has significant food cravings issues, she frequently makes poor food choices, she has problems with excessive hunger, she frequently eats larger portions than normal and she struggles with emotional eating.  Depression Screen Lisa [facility]'s Food and Mood (modified PHQ-9) score was 7.  Depression screen PHQ 2/9 05/29/2020  Decreased Interest 1  Down, Depressed, Hopeless 1  PHQ - 2 Score 2  Altered sleeping 1  Tired, decreased energy 2  Change in appetite 1  Feeling bad or failure about yourself  1  Trouble concentrating 0  Moving slowly or fidgety/restless 0  Suicidal thoughts 0  PHQ-9 Score 7  Difficult doing work/chores Not difficult at all   Subjective:   1. Other fatigue Lisa Cannon admits to daytime somnolence and admits to waking up still tired. Patent has a history of symptoms of daytime fatigue and morning headache. Lisa Cannon generally gets 8 hours of sleep  per night, and states that she has generally restful sleep. Snoring is present. Apneic episodes are present. Epworth Sleepiness Score is 1.  2. SOB (shortness of breath) on exertion Kaislyn notes increasing shortness of breath with exercising and seems to be worsening over time with weight gain. She notes getting out of breath sooner with activity than she used to. This has not gotten worse recently. Caragh denies shortness of breath at rest or orthopnea.  3. Other migraine without status migrainosus, not intractable Lisa Cannon sees Dr. Felecia Shelling at [facility]. She started new infusions but is still having migraines (previous 20 times per month).  53. Pre-diabetes Lisa Cannon was diagnosed with pre-diabetes 3 years ago. Last A1c was 5.8 on 05/09/2020.  5. Transaminitis Lisa Cannon's last ALT was 30 AST 20, and ALK phos 73. She has no ultrasound on file.  6. Other hyperlipidemia Lisa Cannon's last LDL was 124, HDL 45, and triglycerides 155. She is not on statin.  7. At risk for diabetes mellitus Lisa Cannon is at higher than average risk for developing diabetes due to her obesity.   Assessment/Plan:   1. Other fatigue Lisa Cannon does feel that her weight is causing her energy to be lower than it should be. Fatigue may be related to obesity, depression or many other causes. Labs will be ordered, and in the meanwhile, Alyscia will focus on self care including making healthy food choices, increasing physical activity and focusing on stress reduction.  - EKG 12-Lead - VITAMIN D 25 Hydroxy (Vit-D Deficiency, Fractures) - Vitamin B12 - Folate - T3 - T4  2. SOB (shortness of breath)  on exertion Lisa Cannon does feel that she gets out of breath more easily that she used to when she exercises. Mckaila's shortness of breath appears to be obesity related and exercise induced. She has agreed to work on weight loss and gradually increase exercise to treat her exercise induced shortness of breath. Will continue to monitor closely.  - VITAMIN D  25 Hydroxy (Vit-D Deficiency, Fractures) - Vitamin B12 - Folate - T3 - T4  3. Other migraine without status migrainosus, not intractable Lisa Cannon will follow up with [facility].  4. Pre-diabetes Lisa Cannon will continue to work on weight loss, exercise, and decreasing simple carbohydrates to help decrease the risk of diabetes. We will check labs today.  - Insulin, random  5. Transaminitis We will recheck CMP in 3 months.  6. Other hyperlipidemia Cardiovascular risk and specific lipid/LDL goals reviewed. We discussed several lifestyle modifications today and Shaquilla will continue to work on diet, exercise and weight loss efforts. We will repeat FLP in 3 months. Orders and follow up as documented in patient record.   Counseling Intensive lifestyle modifications are the first line treatment for this issue. . Dietary changes: Increase soluble fiber. Decrease simple carbohydrates. . Exercise changes: Moderate to vigorous-intensity aerobic activity 150 minutes per week if tolerated. . Lipid-lowering medications: see documented in medical record.  7. Depression screening Etsuko had a positive depression screening. Depression is commonly associated with obesity and often results in emotional eating behaviors. We will monitor this closely and work on CBT to help improve the non-hunger eating patterns. Referral to Psychology may be required if no improvement is seen as she continues in our clinic.  8. At risk for diabetes mellitus Kambria was given approximately 15 minutes of diabetes education and counseling today. We discussed intensive lifestyle modifications today with an emphasis on weight loss as well as increasing exercise and decreasing simple carbohydrates in her diet. We also reviewed medication options with an emphasis on risk versus benefit of those discussed.   Repetitive spaced learning was employed today to elicit superior memory formation and behavioral  change.  9. Class 2 severe obesity with serious comorbidity and body mass index (BMI) of 36.0 to 36.9 in adult, unspecified obesity type Lisa [facility]) Clarise is currently in the action stage of change and her goal is to continue with weight loss efforts. I recommend Elizebath begin the structured treatment plan as follows:  She has agreed to the Category 4 Plan.  Exercise goals: No exercise has been prescribed at this time.   Behavioral modification strategies: increasing lean protein intake, meal planning and cooking strategies, keeping healthy foods in the home and planning for success.  She was informed of the importance of frequent follow-up visits to maximize her success with intensive lifestyle modifications for her multiple health conditions. She was informed we would discuss her lab results at her next visit unless there is a critical issue that needs to be addressed sooner. Zorina agreed to keep her next visit at the agreed upon time to discuss these results.  Objective:   Blood pressure 115/78, pulse 79, temperature 98.2 F (36.8 C), temperature source Oral, height 5' 6"  (1.676 m), weight 223 lb (101.2 kg), last menstrual period 05/20/2020, SpO2 95 %. Body mass index is 35.99 kg/m.  EKG: Normal sinus rhythm, rate 86 BPM.  Indirect Calorimeter completed today shows a VO2 of 327 and a REE of 2275.  Her calculated basal metabolic rate is 0973 thus her basal metabolic rate is better than expected.  General: Cooperative, alert, well developed, in no acute distress. HEENT: Conjunctivae and lids unremarkable. Cardiovascular: Regular rhythm.  Lungs: Normal work of breathing. Neurologic: No focal deficits.   Lab Results  Component Value Date   CREATININE 0.75 05/29/2020   BUN 13 05/29/2020   NA 139 05/29/2020   K 4.3 05/29/2020   CL 102 05/29/2020   CO2 24 05/29/2020   Lab Results  Component Value Date   ALT 51 (H) 05/29/2020   AST 30 05/29/2020   ALKPHOS 91 05/29/2020   BILITOT 0.3  05/29/2020   Lab Results  Component Value Date   HGBA1C 5.8 (H) 05/09/2020   HGBA1C 5.7 (H) 05/04/2019   HGBA1C 5.6 04/02/2018   HGBA1C 5.7 (H) 05/03/2016   Lab Results  Component Value Date   INSULIN 40.3 (H) 05/29/2020   Lab Results  Component Value Date   TSH 1.45 05/09/2020   Lab Results  Component Value Date   CHOL 196 05/09/2020   HDL 45 (L) 05/09/2020   LDLCALC 124 (H) 05/09/2020   TRIG 155 (H) 05/09/2020   CHOLHDL 4.4 05/09/2020   Lab Results  Component Value Date   WBC 7.6 05/09/2020   HGB 15.3 05/09/2020   HCT 46.1 (H) 05/09/2020   MCV 86.2 05/09/2020   PLT 309 05/09/2020   No results found for: IRON, TIBC, FERRITIN  Attestation Statements:   This is the patient's first visit at [facility]. The patient's NEW PATIENT PACKET was reviewed at length. Included in the packet: current and past health history, medications, allergies, ROS, gynecologic history (women only), surgical history, family history, social history, weight history, weight loss surgery history (for those that have had weight loss surgery), nutritional evaluation, mood and food questionnaire, PHQ9, Epworth questionnaire, sleep habits questionnaire, patient life and health improvement goals questionnaire. These will all be scanned into the patient's chart under media.   During the visit, I independently reviewed the patient's EKG, bioimpedance scale results, and indirect calorimeter results. I used this information to tailor a meal plan for the patient that will help her to lose weight and will improve her obesity-related conditions going forward. I performed a medically necessary appropriate examination and/or evaluation. I discussed the assessment and treatment plan with the patient. The patient was provided an opportunity to ask questions and all were answered. The patient agreed with the plan and demonstrated an understanding of the instructions. Labs were ordered at this visit and will  be reviewed at the next visit unless more critical results need to be addressed immediately. Clinical information was updated and documented in the EMR.   Time spent on visit including pre-visit chart review and post-visit care was 45 minutes.   A separate 15 minutes was spent on risk counseling (see above).    I, Trixie Dredge, am acting as transcriptionist for Coralie Common, MD.  I have reviewed the above documentation for accuracy and completeness, and I agree with the above. - Jinny Blossom, MD

## 2020-06-06 MED FILL — NADOLOL 20 MG TABS: 20 | 90 days supply | Qty: 270 | Fill #3

## 2020-06-12 ENCOUNTER — Ambulatory Visit (INDEPENDENT_AMBULATORY_CARE_PROVIDER_SITE_OTHER): Payer: 59 | Admitting: Family Medicine

## 2020-06-12 ENCOUNTER — Other Ambulatory Visit: Payer: Self-pay

## 2020-06-12 ENCOUNTER — Encounter (INDEPENDENT_AMBULATORY_CARE_PROVIDER_SITE_OTHER): Payer: Self-pay | Admitting: Family Medicine

## 2020-06-12 VITALS — BP 105/69 | HR 77 | Temp 98.7°F | Ht 66.0 in | Wt 220.0 lb

## 2020-06-12 DIAGNOSIS — Z6835 Body mass index (BMI) 35.0-35.9, adult: Secondary | ICD-10-CM

## 2020-06-12 DIAGNOSIS — R7401 Elevation of levels of liver transaminase levels: Secondary | ICD-10-CM | POA: Diagnosis not present

## 2020-06-12 DIAGNOSIS — E559 Vitamin D deficiency, unspecified: Secondary | ICD-10-CM

## 2020-06-12 DIAGNOSIS — R7303 Prediabetes: Secondary | ICD-10-CM

## 2020-06-12 DIAGNOSIS — Z9189 Other specified personal risk factors, not elsewhere classified: Secondary | ICD-10-CM

## 2020-06-12 MED ORDER — VITAMIN D (ERGOCALCIFEROL) 1.25 MG (50000 UNIT) PO CAPS: 50000.0000 [IU] | ORAL_CAPSULE | ORAL | 0 refills | Status: DC

## 2020-06-12 MED FILL — VIT D2 1.25 MG (50,000 UNIT: 1.25 MG | 28 days supply | Qty: 4 | Fill #0

## 2020-06-15 NOTE — Progress Notes (Signed)
Chief Complaint:   OBESITY Lisa Cannon is here to discuss her progress with her obesity treatment plan along with follow-up of her obesity related diagnoses. Lisa Cannon is on the Category 4 Plan and states she is following her eating plan approximately 75 to 80% of the time. Lisa Cannon states she has not exercised since her last visit.  Today's visit was #: 2 Starting weight: 223 lbs Starting date: 05/29/20 Today's weight: 220 lbs Today's date: 06/12/2020 Total lbs lost to date: 3 Total lbs lost since last in-office visit: 3  Interim History: Lisa Cannon felt that the past few weeks were good in terms of quantity of food. Occasionally, she would feel hungry after dinner because she feels that she eats dinner early. Lisa Cannon would eat cottage cheese, blueberries, and crackers for snack calories.  Subjective:   1. Vitamin D deficiency Lisa Cannon's Vitamin D level was 24.9 on 05/29/20. She is currently taking no vitamin D supplement. She notes fatigue and denies nausea, vomiting, or muscle weakness.  2. Prediabetes Lisa Cannon has a diagnosis of prediabetes based on her elevated HgbA1c and was informed this puts her at greater risk of developing diabetes. Her A1c was 5.8 and her insulin was 40.3. Lisa Cannon has been barely below prediabetes for the last 3 years. Lisa Cannon notes carb cravings. She continues to work on diet and exercise to decrease her risk of diabetes. She denies nausea or hypoglycemia.  Lab Results  Component Value Date   HGBA1C 5.8 (H) 05/09/2020   Lab Results  Component Value Date   INSULIN 40.3 (H) 05/29/2020   3. Transaminitis Lisa Cannon's ALT was 51, AST was 30, and Alk Phos. was 91 on 05/29/20. There is no ultrasound on file.  4. At risk for osteoporosis Lisa Cannon is at risk for osteoporosis.    Assessment/Plan:   1. Vitamin D deficiency Low Vitamin D level contributes to fatigue and are associated with obesity, breast, and colon cancer. She agrees to continue to take prescription Vitamin D  _0 ,000 IU every week and will follow-up for routine testing of Vitamin D, at least 2-3 times per year to avoid over-replacement.  - Vitamin D, Ergocalciferol, (DRISDOL) 1.25 MG (50000 UNIT) CAPS capsule; Take 1 capsule (50,000 Units total) by mouth every 7 (seven) days.  Dispense: 4 capsule; Refill: 0  2. Prediabetes Lisa Cannon will continue to work on weight loss, exercise, and decreasing simple carbohydrates to help decrease the risk of diabetes. We will not give medications at this time and will revisit metformin if weight loss stalls or hunger is increased.  3. Transaminitis We will repeat a CMP on Lisa Cannon in 3 months.  4. At risk for osteoporosis Lisa Cannon was given approximately 15 minutes of osteoporosis prevention counseling today. Lisa Cannon is at risk for osteopenia and osteoporosis due to her Vitamin D deficiency. She was encouraged to take her Vitamin D and follow her higher calcium diet and increase strengthening exercise to help strengthen her bones and decrease her risk of osteopenia and osteoporosis.  Repetitive spaced learning was employed today to elicit superior memory formation and behavioral change.  5. Class 2 severe obesity with serious comorbidity and body mass index (BMI) of 35.0 to 35.9 in adult, unspecified obesity type [facility])  Cannon is currently in the action stage of change. As such, her goal is to continue with weight loss efforts. She has agreed to keeping a food journal and adhering to recommended goals of 1650 to 1800 calories and 125+ grams of protein.   Exercise goals: No exercise has  been prescribed at this time.  Behavioral modification strategies: increasing lean protein intake, increasing vegetables, meal planning and cooking strategies and planning for success.  Lisa Cannon has agreed to follow-up with our clinic in 2 weeks. She was informed of the importance of frequent follow-up visits to maximize her success with intensive lifestyle modifications for her multiple  health conditions.   Objective:   Blood pressure 105/69, pulse 77, temperature 98.7 F (37.1 C), temperature source Oral, height _0  (1.676 m), weight 220 lb (99.8 kg), last menstrual period 05/19/2020, SpO2 99 %. Body mass index is 35.51 kg/m.  General: Cooperative, alert, well developed, in no acute distress. HEENT: Conjunctivae and lids unremarkable. Cardiovascular: Regular rhythm.  Lungs: Normal work of breathing. Neurologic: No focal deficits.   Lab Results  Component Value Date   CREATININE 0.75 05/29/2020   BUN 13 05/29/2020   NA 139 05/29/2020   K 4.3 05/29/2020   CL 102 05/29/2020   CO2 24 05/29/2020   Lab Results  Component Value Date   ALT 51 (H) 05/29/2020   AST 30 05/29/2020   ALKPHOS 91 05/29/2020   BILITOT 0.3 05/29/2020   Lab Results  Component Value Date   HGBA1C 5.8 (H) 05/09/2020   HGBA1C 5.7 (H) 05/04/2019   HGBA1C 5.6 04/02/2018   HGBA1C 5.7 (H) 05/03/2016   Lab Results  Component Value Date   INSULIN 40.3 (H) 05/29/2020   Lab Results  Component Value Date   TSH 1.45 05/09/2020   Lab Results  Component Value Date   CHOL 196 05/09/2020   HDL 45 (L) 05/09/2020   LDLCALC 124 (H) 05/09/2020   TRIG 155 (H) 05/09/2020   CHOLHDL 4.4 05/09/2020   Lab Results  Component Value Date   WBC 7.6 05/09/2020   HGB 15.3 05/09/2020   HCT 46.1 (H) 05/09/2020   MCV 86.2 05/09/2020   PLT 309 05/09/2020   No results found for: IRON, TIBC, FERRITIN  Attestation Statements:   Reviewed by clinician on day of visit: allergies, medications, problem list, medical history, surgical history, family history, social history, and previous encounter notes.  IMarcille Blanco, CMA, am acting as transcriptionist for Coralie Common, MD  I have reviewed the above documentation for accuracy and completeness, and I agree with the above. - Jinny Blossom, MD

## 2020-06-24 ENCOUNTER — Encounter (INDEPENDENT_AMBULATORY_CARE_PROVIDER_SITE_OTHER): Payer: Self-pay | Admitting: Family Medicine

## 2020-06-26 ENCOUNTER — Ambulatory Visit (INDEPENDENT_AMBULATORY_CARE_PROVIDER_SITE_OTHER): Payer: 59 | Admitting: Family Medicine

## 2020-07-03 ENCOUNTER — Encounter (INDEPENDENT_AMBULATORY_CARE_PROVIDER_SITE_OTHER): Payer: Self-pay | Admitting: Physician Assistant

## 2020-07-03 ENCOUNTER — Other Ambulatory Visit: Payer: Self-pay

## 2020-07-03 ENCOUNTER — Ambulatory Visit (INDEPENDENT_AMBULATORY_CARE_PROVIDER_SITE_OTHER): Payer: 59 | Admitting: Physician Assistant

## 2020-07-03 VITALS — BP 118/77 | HR 84 | Temp 98.9°F | Ht 66.0 in | Wt 220.0 lb

## 2020-07-03 DIAGNOSIS — E559 Vitamin D deficiency, unspecified: Secondary | ICD-10-CM

## 2020-07-03 DIAGNOSIS — Z6835 Body mass index (BMI) 35.0-35.9, adult: Secondary | ICD-10-CM

## 2020-07-03 DIAGNOSIS — E7849 Other hyperlipidemia: Secondary | ICD-10-CM | POA: Diagnosis not present

## 2020-07-03 DIAGNOSIS — Z9189 Other specified personal risk factors, not elsewhere classified: Secondary | ICD-10-CM

## 2020-07-03 MED ORDER — VITAMIN D (ERGOCALCIFEROL) 1.25 MG (50000 UNIT) PO CAPS: 50000.0000 [IU] | ORAL_CAPSULE | ORAL | 0 refills | Status: DC

## 2020-07-03 NOTE — Progress Notes (Signed)
Chief Complaint:   OBESITY Lisa Cannon is here to discuss her progress with her obesity treatment plan along with follow-up of her obesity related diagnoses. Macaela is on the Category 4 Plan and states she is following her eating plan approximately 75% of the time. Sharna states she is exercising 0 minutes 0 times per week.  Today's visit was #: 3 Starting weight: 223 lbs Starting date: 05/29/2020 Today's weight: 220 lbs Today's date: 07/03/2020 Total lbs lost to date: 3 Total lbs lost since last in-office visit: 0  Interim History: Lisa Cannon reports that she believes she is not eating enough protein. She is eating [employer] for dinner, which is high in calories and lower in protein.  Subjective:   Other hyperlipidemia. Lisa Cannon has hyperlipidemia and has been trying to improve her cholesterol levels with intensive lifestyle modification including a low saturated fat diet, exercise and weight loss. She denies any chest pain. Milia is on no medication.  Lab Results  Component Value Date   ALT 51 (H) 05/29/2020   AST 30 05/29/2020   ALKPHOS 91 05/29/2020   BILITOT 0.3 05/29/2020   Lab Results  Component Value Date   CHOL 196 05/09/2020   HDL 45 (L) 05/09/2020   LDLCALC 124 (H) 05/09/2020   TRIG 155 (H) 05/09/2020   CHOLHDL 4.4 05/09/2020   Vitamin D deficiency. Lisa Cannon is on prescription Vitamin D supplementation. No nausea, vomiting, or muscle weakness.    Ref. Range 05/29/2020 14:34  Vitamin D, 25-Hydroxy Latest Ref Range: 30.0 - 100.0 ng/mL 24.9 (L)   At risk for osteoporosis. Lisa Cannon is at higher risk of osteopenia and osteoporosis due to Vitamin D deficiency.   Assessment/Plan:   Other hyperlipidemia. Cardiovascular risk and specific lipid/LDL goals reviewed.  We discussed several lifestyle modifications today and Carl will continue to work on diet, exercise and weight loss efforts. Orders and follow up as documented in patient record.   Counseling Intensive  lifestyle modifications are the first line treatment for this issue. . Dietary changes: Increase soluble fiber. Decrease simple carbohydrates. . Exercise changes: Moderate to vigorous-intensity aerobic activity 150 minutes per week if tolerated.  . Lipid-lowering medications: see documented in medical record.  Vitamin D deficiency. Low Vitamin D level contributes to fatigue and are associated with obesity, breast, and colon cancer. She was given a refill on her Vitamin D, Ergocalciferol, (DRISDOL) 1.25 MG (50000 UNIT) CAPS capsule every week #4 with 0 refills and will follow-up for routine testing of Vitamin D, at least 2-3 times per year to avoid over-replacement.   At risk for osteoporosis. Lisa Cannon was given approximately 15 minutes of osteoporosis prevention counseling today. Lisa Cannon is at risk for osteopenia and osteoporosis due to her Vitamin D deficiency. She was encouraged to take her Vitamin D and follow her higher calcium diet and increase strengthening exercise to help strengthen her bones and decrease her risk of osteopenia and osteoporosis.  Repetitive spaced learning was employed today to elicit superior memory formation and behavioral change.  Class 2 severe obesity with serious comorbidity and body mass index (BMI) of 35.0 to 35.9 in adult, unspecified obesity type ([facility]).  Lisa Cannon is currently in the action stage of change. As such, her goal is to continue with weight loss efforts. She has agreed to keeping a food journal and adhering to recommended goals of 1800 calories and 115 grams of protein daily.   Exercise goals: For substantial health benefits, adults should do at least 150 minutes (2 hours  and 30 minutes) a week of moderate-intensity, or 75 minutes (1 hour and 15 minutes) a week of vigorous-intensity aerobic physical activity, or an equivalent combination of moderate- and vigorous-intensity aerobic activity. Aerobic activity should be performed in episodes of at least 10 minutes,  and preferably, it should be spread throughout the week.  Behavioral modification strategies: increasing lean protein intake and meal planning and cooking strategies.  Lisa Cannon has agreed to follow-up with our clinic in 2 weeks. She was informed of the importance of frequent follow-up visits to maximize her success with intensive lifestyle modifications for her multiple health conditions.   Objective:   Blood pressure 118/77, pulse 84, temperature 98.9 F (37.2 C), temperature source Oral, height 5\' 6"  (1.676 m), weight 220 lb (99.8 kg), SpO2 96 %. Body mass index is 35.51 kg/m.  General: Cooperative, alert, well developed, in no acute distress. HEENT: Conjunctivae and lids unremarkable. Cardiovascular: Regular rhythm.  Lungs: Normal work of breathing. Neurologic: No focal deficits.   Lab Results  Component Value Date   CREATININE 0.75 05/29/2020   BUN 13 05/29/2020   NA 139 05/29/2020   K 4.3 05/29/2020   CL 102 05/29/2020   CO2 24 05/29/2020   Lab Results  Component Value Date   ALT 51 (H) 05/29/2020   AST 30 05/29/2020   ALKPHOS 91 05/29/2020   BILITOT 0.3 05/29/2020   Lab Results  Component Value Date   HGBA1C 5.8 (H) 05/09/2020   HGBA1C 5.7 (H) 05/04/2019   HGBA1C 5.6 04/02/2018   HGBA1C 5.7 (H) 05/03/2016   Lab Results  Component Value Date   INSULIN 40.3 (H) 05/29/2020   Lab Results  Component Value Date   TSH 1.45 05/09/2020   Lab Results  Component Value Date   CHOL 196 05/09/2020   HDL 45 (L) 05/09/2020   LDLCALC 124 (H) 05/09/2020   TRIG 155 (H) 05/09/2020   CHOLHDL 4.4 05/09/2020   Lab Results  Component Value Date   WBC 7.6 05/09/2020   HGB 15.3 05/09/2020   HCT 46.1 (H) 05/09/2020   MCV 86.2 05/09/2020   PLT 309 05/09/2020   No results found for: IRON, TIBC, FERRITIN  Attestation Statements:   Reviewed by clinician on day of visit: allergies, medications, problem list, medical history, surgical history, family history, social history,  and previous encounter notes.  IMichaelene Song, am acting as transcriptionist for Abby Potash, PA-C   I have reviewed the above documentation for accuracy and completeness, and I agree with the above. Abby Potash, PA-C

## 2020-07-04 ENCOUNTER — Ambulatory Visit (HOSPITAL_COMMUNITY)
Admission: RE | Admit: 2020-07-04 | Discharge: 2020-07-04 | Disposition: A | Discharge status: Home / Self Care | Payer: 59 | Source: Ambulatory Visit | Attending: Neurology | Admitting: Neurology

## 2020-07-04 DIAGNOSIS — G43909 Migraine, unspecified, not intractable, without status migrainosus: Secondary | ICD-10-CM | POA: Diagnosis not present

## 2020-07-04 MED ORDER — SODIUM CHLORIDE 0.9 % IV SOLN
100.0000 mg | INTRAVENOUS | Status: DC
  Administered 2020-07-04: 100 mg via INTRAVENOUS
  Filled 2020-07-04: qty 1

## 2020-07-18 ENCOUNTER — Telehealth: Payer: Self-pay | Admitting: Neurology

## 2020-07-18 NOTE — Telephone Encounter (Signed)
Pt called wanting to speak to RN or Provider to discuss the Infusion she is receiving. Pt states that the first one worked well but the last two have not. Please advise.

## 2020-07-18 NOTE — Telephone Encounter (Signed)
Called pt. Had a bad connection with pt, she ended call. I tried calling back, went to her VM. I called a third time. States 1st vyepti infusion helped but her 2nd and 3rd infusion has not. Her next infusion is in November. Would like to further discuss options with Dr. Felecia Shelling. I offered appt today at 1030am or tomorrow at 2pm. She declined both. Unable to come d/t her daughter having open house for school. Advised I will place her on cx list and call if anything becomes available. She states after this week, she should be able to come in since daughter will be in school.

## 2020-07-24 ENCOUNTER — Ambulatory Visit (INDEPENDENT_AMBULATORY_CARE_PROVIDER_SITE_OTHER): Payer: 59 | Admitting: Family Medicine

## 2020-07-24 ENCOUNTER — Other Ambulatory Visit: Payer: Self-pay | Admitting: Neurology

## 2020-07-24 DIAGNOSIS — M542 Cervicalgia: Secondary | ICD-10-CM

## 2020-07-24 MED FILL — CYCLOBENZAPRINE HCL 5 MG TA: 5 | 90 days supply | Qty: 90 | Fill #0

## 2020-08-03 NOTE — Telephone Encounter (Signed)
Called pt, offered appt 08/10/20 at 10am with Dr. Felecia Shelling. He had a cx then. She accepted/I scheduled. She is aware to come to appt alone, wear mask d/t covid-19. She will check in at 930a

## 2020-08-10 ENCOUNTER — Other Ambulatory Visit: Payer: Self-pay

## 2020-08-10 ENCOUNTER — Ambulatory Visit: Payer: 59 | Admitting: Neurology

## 2020-08-10 ENCOUNTER — Encounter: Payer: Self-pay | Admitting: Neurology

## 2020-08-10 VITALS — BP 114/72 | HR 76 | Ht 66.0 in | Wt 224.0 lb

## 2020-08-10 DIAGNOSIS — G43709 Chronic migraine without aura, not intractable, without status migrainosus: Secondary | ICD-10-CM | POA: Diagnosis not present

## 2020-08-10 DIAGNOSIS — Z6836 Body mass index (BMI) 36.0-36.9, adult: Secondary | ICD-10-CM | POA: Diagnosis not present

## 2020-08-10 DIAGNOSIS — M542 Cervicalgia: Secondary | ICD-10-CM | POA: Diagnosis not present

## 2020-08-10 DIAGNOSIS — IMO0002 Reserved for concepts with insufficient information to code with codable children: Secondary | ICD-10-CM

## 2020-08-10 DIAGNOSIS — G43009 Migraine without aura, not intractable, without status migrainosus: Secondary | ICD-10-CM

## 2020-08-10 MED ORDER — DIVALPROEX SODIUM ER 500 MG PO TB24
500.0000 mg | ORAL_TABLET | Freq: Every day | ORAL | 5 refills | Status: DC
Start: 2020-08-10 — End: 2021-02-12

## 2020-08-10 MED FILL — DIVALPROEX SOD ER 500 MG TA: 500 | 30 days supply | Qty: 30 | Fill #0

## 2020-08-10 NOTE — Progress Notes (Signed)
[facility]  PATIENT: Lisa Cannon DOB: 14-Apr-1981  REFERRING DOCTOR OR PCP:  Dr. Madilyn Fireman SOURCE: patient, notes from PCP, labs  _________________________________   HISTORICAL  CHIEF COMPLAINT:  Chief Complaint  Patient presents with  . Follow-up    RM 12, ,alone. Last seen 12/30/2019.   . Migraine    Feels Vyepti infusions are no longer helping (Has had three infusions so far. 1st one worked great but second two did not help). Wants to discuss other treatment options.     HISTORY OF PRESENT ILLNESS:  Lisa Cannon is a 39 yo woman with chronic migraine headaches.  Update 08/10/2020: She had Vyepti in February and did well x 7-8 weeks.   She had little benefit with the second or third injections though.    She continues to have pain and pressure in her face and head on a daily basis.   She still takes flexeril 5 mg po tid as it had helped her some.     Botox did not help.  Topiramate has been tried a couple times but she did not get a benefit and also had side effects.  Propranolol, Effexor, amitriptyline, Corgard, have been tried in the past as well with minimal to no benefit or side effects.   Keppra in the past caused disorientation.     Migraine History: She is a 39 year old woman who has had migraines since age 59.   Initially, headaches were sporadic but they have become more chronic.     Currently, she has 16-20 migraine days a month for 4 or more hours a day.   She does not have aura.  Migraines are left frontal with a throbbing knifelike quality.   She often has pressure in her eyes and pain in the back of the head.   She sometimes has nausea and no vomiting.    She has photophobia and phonophobia.   During her cycle, HA's are daily and the rest of the month they are intermittent. Moving her head intensifies the pain.    Laying down does not help.     Imitrex will usually help for the rest of the day but then the HA returns.          MED's tried: She  was on gabapentin for over a year but had no benefit and actually did better when she stopped.    Topiramate was tried twice with no benefit and some side effects.   A combination of Corgard and Flexeril helped mildly (but only 20-25% reduction in frequency).   She has tried and failed propranolol.   Effexor or amitriptyline did not help and the tricyclic caused side effects.    Aimovig had not helped (did x 1 week only) .   Botox made HA worse.   Emgality did not help.  Vyepti helped the forst course but not 2nd or third course   REVIEW OF SYSTEMS: Constitutional: No fevers, chills, sweats, or change in appetite Eyes: No visual changes, double vision, eye pain Ear, nose and throat: No hearing loss, ear pain, nasal congestion, sore throat Cardiovascular: No chest pain, palpitations Respiratory: No shortness of breath at rest or with exertion.   No wheezes GastrointestinaI: No nausea, vomiting, diarrhea, abdominal pain, fecal incontinence Genitourinary: No dysuria, urinary retention or frequency.  No nocturia. Musculoskeletal: No neck pain, back pain Integumentary: No rash, pruritus, skin lesions Neurological: as above Psychiatric: No depression at this time.  No anxiety Endocrine: No palpitations, diaphoresis,  change in appetite, change in weigh or increased thirst Hematologic/Lymphatic: No anemia, purpura, petechiae. Allergic/Immunologic: Has seasonal allergies  ALLERGIES: No Known Allergies  HOME MEDICATIONS:  Current Outpatient Medications:  .  cyclobenzaprine (FLEXERIL) 5 MG tablet, TAKE 1 TABLET BY MOUTH DAILY AS NEEDED FOR NECK PAIN, Disp: 90 tablet, Rfl: 0 .  Eptinezumab-jjmr (VYEPTI IV), , Disp: , Rfl:  .  fluticasone (FLONASE) 50 MCG/ACT nasal spray, Place into both nostrils as needed for allergies or rhinitis., Disp: , Rfl:  .  Multiple Vitamin (MULTIVITAMIN) capsule, Take 1 capsule by mouth daily., Disp: , Rfl:  .  nadolol (CORGARD) 20 MG tablet, TAKE 3 TABLETS BY MOUTH  DAILY, Disp: 270 tablet, Rfl: 3 .  naproxen sodium (ALEVE) 220 MG tablet, Take 220 mg by mouth daily as needed (for pain)., Disp: , Rfl:  .  Probiotic Product (PROBIOTIC PO), Take by mouth., Disp: , Rfl:  .  SUMAtriptan (IMITREX) 100 MG tablet, TAKE 1 TABLET BY MOUTH AT ONSET OF HEADACHE. MAY REPEAT DOSE AFTER 2 HOURS. DO NOT TAKE MORE THAN 2 DAYS A WEEK., Disp: 30 tablet, Rfl: 3 .  divalproex (DEPAKOTE ER) 500 MG 24 hr tablet, Take 1 tablet (500 mg total) by mouth daily., Disp: 30 tablet, Rfl: 5  PAST MEDICAL HISTORY: Past Medical History:  Diagnosis Date  . Abnormal Pap smear of cervix   . Heartburn   . Migraines     PAST SURGICAL HISTORY: Past Surgical History:  Procedure Laterality Date  . CERVICAL BIOPSY  W/ LOOP ELECTRODE EXCISION  2002  . LAPAROSCOPIC BILATERAL SALPINGECTOMY Bilateral 09/12/2015   Procedure: LAPAROSCOPIC BILATERAL SALPINGECTOMY;  Surgeon: Emily Filbert, MD;  Location: [facility];  Service: Gynecology;  Laterality: Bilateral;  . LAPAROSCOPIC LYSIS OF ADHESIONS  09/12/2015   Procedure: LAPAROSCOPIC LYSIS OF ADHESIONS;  Surgeon: Emily Filbert, MD;  Location: [facility];  Service: Gynecology;;  . TUBAL LIGATION    . WISDOM TOOTH EXTRACTION      FAMILY HISTORY: Family History  Problem Relation Age of Onset  . Hypertension Maternal Grandfather   . Cancer Paternal Grandfather        lung cancer  . Cancer Paternal Grandmother        brain  . Hyperlipidemia Mother   . Diabetes Maternal Aunt   . Hypertension Maternal Aunt     SOCIAL HISTORY:  Social History   Socioeconomic History  . Marital status: Married    Spouse name: Not on file  . Number of children: 1  . Years of education: 26  . Highest education level: Bachelor's degree (e.g., BA, AB, BS)  Occupational History  . Occupation: homemaker  Tobacco Use  . Smoking status: Never Smoker  . Smokeless tobacco: Never Used  Vaping Use  . Vaping Use: Never used  Substance and Sexual Activity  . Alcohol use: No     Alcohol/week: 0.0 standard drinks  . Drug use: No  . Sexual activity: Yes    Partners: Male    Birth control/protection: Surgical  Other Topics Concern  . Not on file  Social History Narrative   Lives with husband   Caffeine use: none   Right handed    Social Determinants of Health   Financial Resource Strain:   . Difficulty of Paying Living Expenses: Not on file  Food Insecurity:   . Worried About Charity fundraiser in the Last Year: Not on file  . Ran Out of Food in the Last Year: Not on file  Transportation Needs:   . Film/video editor (Medical): Not on file  . Lack of Transportation (Non-Medical): Not on file  Physical Activity:   . Days of Exercise per Week: Not on file  . Minutes of Exercise per Session: Not on file  Stress:   . Feeling of Stress : Not on file  Social Connections:   . Frequency of Communication with Friends and Family: Not on file  . Frequency of Social Gatherings with Friends and Family: Not on file  . Attends Religious Services: Not on file  . Active Member of Clubs or Organizations: Not on file  . Attends Archivist Meetings: Not on file  . Marital Status: Not on file  Intimate Partner Violence:   . Fear of Current or Ex-Partner: Not on file  . Emotionally Abused: Not on file  . Physically Abused: Not on file  . Sexually Abused: Not on file     PHYSICAL EXAM  Vitals:   08/10/20 0959  BP: 114/72  Pulse: 76  Weight: 224 lb (101.6 kg)  Height: 5\' 6"  (1.676 m)    Body mass index is 36.15 kg/m.   General: The patient is well-developed and well-nourished and in no acute distress  Neck: Range of motion is normal in the neck.  No splenius capitus/paraspinal tenderness today.  Neurologic Exam  Mental status: The patient is alert and oriented x 3 at the time of the examination. The patient has apparent normal recent and remote memory, with an apparently normal attention span and concentration ability.   Speech is  normal.  Cranial nerves: Extraocular movements are full.  Facial strength and sensation is normal.  The tongue is midline, and the patient has symmetric elevation of the soft palate. No obvious hearing deficits are noted.  Motor:  Muscle bulk is normal.   Strength is normal in the arms  Sensory:   Intact touch x 4  Coordination: She has good finger-nose-finger bilaterally.  Gait and station: Station is normal.   Gait is normal. Tandem gait is normal. Romberg is negative.     DIAGNOSTIC DATA (LABS, IMAGING, TESTING) - I reviewed patient records, labs, notes, testing and imaging myself where available.  Lab Results  Component Value Date   WBC 7.6 05/09/2020   HGB 15.3 05/09/2020   HCT 46.1 (H) 05/09/2020   MCV 86.2 05/09/2020   PLT 309 05/09/2020      Component Value Date/Time   NA 139 05/29/2020 1434   K 4.3 05/29/2020 1434   CL 102 05/29/2020 1434   CO2 24 05/29/2020 1434   GLUCOSE 103 (H) 05/29/2020 1434   GLUCOSE 116 (H) 05/09/2020 0927   GLUCOSE 82 05/18/2012 1328   BUN 13 05/29/2020 1434   CREATININE 0.75 05/29/2020 1434   CREATININE 0.72 05/09/2020 0927   CALCIUM 9.9 05/29/2020 1434   PROT 7.6 05/29/2020 1434   ALBUMIN 4.7 05/29/2020 1434   AST 30 05/29/2020 1434   ALT 51 (H) 05/29/2020 1434   ALKPHOS 91 05/29/2020 1434   BILITOT 0.3 05/29/2020 1434   GFRNONAA 101 05/29/2020 1434   GFRNONAA 106 05/09/2020 0927   GFRAA 117 05/29/2020 1434   GFRAA 123 05/09/2020 0927   Lab Results  Component Value Date   CHOL 196 05/09/2020   HDL 45 (L) 05/09/2020   LDLCALC 124 (H) 05/09/2020   TRIG 155 (H) 05/09/2020   CHOLHDL 4.4 05/09/2020   Lab Results  Component Value Date   HGBA1C 5.8 (H) 05/09/2020   Lab  Results  Component Value Date   ICHTVGVS25 486 05/29/2020   Lab Results  Component Value Date   TSH 1.45 05/09/2020       ASSESSMENT AND PLAN  1. Migraine without aura and without status migrainosus, not intractable   2. Chronic migraine   3. Neck  pain   4. BMI 36.0-36.9,adult       1.   Continue to Vyepti (eptinezumab) injection.   Add Depakote 500 mg po qd, check labs in 3-4 weeks, consider increase to 1000 mg 2.  Continue prn Imitrex 3.    Stay active and exercise as tolerated. 4..   Return in 6 months, sooner if new or worsening neurologic issues  Ruwayda Curet A. Felecia Shelling, MD, [facility] 01/10/2416, 53:01 AM Certified in Neurology, Clinical Neurophysiology, Sleep Medicine, Pain Medicine and Neuroimaging  [facility] [address] [place], [place] [address] [phone]

## 2020-08-21 ENCOUNTER — Other Ambulatory Visit: Payer: Self-pay | Admitting: Neurology

## 2020-08-21 DIAGNOSIS — G43009 Migraine without aura, not intractable, without status migrainosus: Secondary | ICD-10-CM

## 2020-08-22 MED FILL — SUMATRIPTAN SUCCINATE 100 M: 100 | 90 days supply | Qty: 27 | Fill #0

## 2020-09-05 ENCOUNTER — Other Ambulatory Visit: Payer: Self-pay | Admitting: Neurology

## 2020-09-05 DIAGNOSIS — G43009 Migraine without aura, not intractable, without status migrainosus: Secondary | ICD-10-CM

## 2020-09-06 ENCOUNTER — Other Ambulatory Visit: Payer: Self-pay | Admitting: Neurology

## 2020-09-06 MED FILL — NADOLOL 20 MG TABS: 20 | 90 days supply | Qty: 270 | Fill #0

## 2020-10-10 ENCOUNTER — Encounter (HOSPITAL_COMMUNITY): Payer: 59

## 2020-10-18 ENCOUNTER — Other Ambulatory Visit: Payer: Self-pay | Admitting: Neurology

## 2020-10-18 DIAGNOSIS — M542 Cervicalgia: Secondary | ICD-10-CM

## 2020-10-18 MED FILL — CYCLOBENZAPRINE HCL 5 MG TA: 5 | 90 days supply | Qty: 90 | Fill #0

## 2020-10-19 ENCOUNTER — Telehealth: Payer: Self-pay | Admitting: *Deleted

## 2020-10-19 NOTE — Telephone Encounter (Signed)
Submitted PA cyclobenzaprine on CMM. Key: [id number]. Waiting on determination from [employer].

## 2020-10-23 NOTE — Telephone Encounter (Signed)
Received fax back from [facility] that no PA needed for cyclobenzaprine. It is a covered benefit.

## 2020-12-04 MED FILL — NADOLOL 20 MG TABS: 20 | 90 days supply | Qty: 270 | Fill #1

## 2020-12-11 MED FILL — SUMATRIPTAN SUCCINATE 100 M: 100 | 90 days supply | Qty: 27 | Fill #1

## 2021-01-22 ENCOUNTER — Other Ambulatory Visit: Payer: Self-pay | Admitting: Neurology

## 2021-01-22 DIAGNOSIS — M542 Cervicalgia: Secondary | ICD-10-CM

## 2021-01-22 MED FILL — CYCLOBENZAPRINE HCL 5 MG TA: 5 | 90 days supply | Qty: 90 | Fill #0

## 2021-02-12 ENCOUNTER — Ambulatory Visit: Payer: 59 | Admitting: Neurology

## 2021-02-12 ENCOUNTER — Other Ambulatory Visit: Payer: Self-pay

## 2021-02-12 ENCOUNTER — Encounter: Payer: Self-pay | Admitting: Neurology

## 2021-02-12 ENCOUNTER — Telehealth: Payer: Self-pay | Admitting: *Deleted

## 2021-02-12 VITALS — BP 133/83 | HR 73 | Ht 66.0 in | Wt 229.0 lb

## 2021-02-12 DIAGNOSIS — G43009 Migraine without aura, not intractable, without status migrainosus: Secondary | ICD-10-CM

## 2021-02-12 NOTE — Telephone Encounter (Signed)
Faxed completed/signed Qulipta enrollment form to [employer] at [phone]. Received fax confirmation. Dose: Quilipta 30mg  tabs, directions: 1 tablet daily #30, 12 refills.

## 2021-02-12 NOTE — Progress Notes (Signed)
[facility]  PATIENT: Lisa Cannon DOB: October 15, 1981  REFERRING DOCTOR OR PCP:  Dr. Madilyn Fireman SOURCE: patient, notes from PCP, labs  _________________________________   HISTORICAL  CHIEF COMPLAINT:  Chief Complaint  Patient presents with  . Follow-up    RM 12, alone. Last seen 08/10/20. Migraine follow up. On Vyepti, imitrex, flexeril. Took depakote for about a month and stopped/made her irritable and anxous. Feels about the same as last visit, feels current treatment plan not managing migraines.     HISTORY OF PRESENT ILLNESS:  [name] is a 40 yo woman with chronic migraine headaches.  Update 02/12/2021: She continues to have chronic migraine 25/30 days a month > 4 hours a day.    Headaches are left greater than right associated with a throbbing quality and pressure.  There is often nausea but no vomiting.  She has photophobia and phonophobia.  Moving the head intensifies the pain.  Imitrex will sometimes help short-term but the pain comes back : She had Vyepti in February 2021and did well x 7-8 weeks. This was better than any medication tried.  However the additional IV infusions (2 more) had not helped.    She had little benefit with the second or third injections though.   Aimovig and Emgality did not have much benefit.   Botox worsened the pain.    She continues to have pain and pressure in her face and head on a daily basis.   She still takes flexeril 5 mg po tid as it had helped her some.     Botox did not help.  Topiramate has been tried a couple times but she did not get a benefit and also had side effects.  Propranolol, Effexor, amitriptyline, Corgard, have been tried in the past as well with minimal to no benefit or side effects.   Keppra in the past caused disorientation.     Migraine History: She is a 40 year old woman who has had migraines since age 31.   Initially, headaches were sporadic but they have become more chronic.     Currently, she has  16-20 migraine days a month for 4 or more hours a day.   She does not have aura.  Migraines are left frontal with a throbbing knifelike quality.   She often has pressure in her eyes and pain in the back of the head.   She sometimes has nausea and no vomiting.    She has photophobia and phonophobia.   During her cycle, HA's are daily and the rest of the month they are intermittent. Moving her head intensifies the pain.    Laying down does not help.     Imitrex will usually help for the rest of the day but then the HA returns.          Medication tried: She was on gabapentin for over a year but had no benefit and actually did better when she stopped.    Topiramate was tried twice with no benefit and some side effects.   A combination of Corgard and Flexeril helped mildly (but only 20-25% reduction in frequency).   She has tried and failed propranolol.   Effexor or amitriptyline did not help and the tricyclic caused side effects.    Aimovig had not helped (did x 1 week only) .   Botox made HA worse.   Emgality did not help.  Vyepti helped the first course but not 2nd or third course so she stopped  REVIEW OF SYSTEMS: Constitutional: No fevers, chills, sweats, or change in appetite Eyes: No visual changes, double vision, eye pain Ear, nose and throat: No hearing loss, ear pain, nasal congestion, sore throat Cardiovascular: No chest pain, palpitations Respiratory: No shortness of breath at rest or with exertion.   No wheezes GastrointestinaI: No nausea, vomiting, diarrhea, abdominal pain, fecal incontinence Genitourinary: No dysuria, urinary retention or frequency.  No nocturia. Musculoskeletal: No neck pain, back pain Integumentary: No rash, pruritus, skin lesions Neurological: as above Psychiatric: No depression at this time.  No anxiety Endocrine: No palpitations, diaphoresis, change in appetite, change in weigh or increased thirst Hematologic/Lymphatic: No anemia, purpura,  petechiae. Allergic/Immunologic: Has seasonal allergies  ALLERGIES: No Known Allergies  HOME MEDICATIONS:  Current Outpatient Medications:  .  cyclobenzaprine (FLEXERIL) 5 MG tablet, TAKE 1 TABLET BY MOUTH ONCE DAILY AS NEEDED FOR NECK PAIN, Disp: 90 tablet, Rfl: 0 .  Eptinezumab-jjmr (VYEPTI IV), , Disp: , Rfl:  .  fluticasone (FLONASE) 50 MCG/ACT nasal spray, Place into both nostrils as needed for allergies or rhinitis., Disp: , Rfl:  .  Multiple Vitamin (MULTIVITAMIN) capsule, Take 1 capsule by mouth daily., Disp: , Rfl:  .  nadolol (CORGARD) 20 MG tablet, TAKE 3 TABLETS BY MOUTH DAILY, Disp: 270 tablet, Rfl: 3 .  naproxen sodium (ALEVE) 220 MG tablet, Take 220 mg by mouth daily as needed (for pain)., Disp: , Rfl:  .  Probiotic Product (PROBIOTIC PO), Take by mouth., Disp: , Rfl:  .  SUMAtriptan (IMITREX) 100 MG tablet, TAKE 1 TABLET BY MOUTH AT ONSET OF HEADACHE. MAY REPEAT DOSE AFTER 2 HOURS. DO NOT TAKE MORE THAN 2 DAYS A WEEK., Disp: 30 tablet, Rfl: 3 .  Atogepant (QULIPTA) 30 MG TABS, Take 1 tablet by mouth daily., Disp: , Rfl:   PAST MEDICAL HISTORY: Past Medical History:  Diagnosis Date  . Abnormal Pap smear of cervix   . Heartburn   . Migraines     PAST SURGICAL HISTORY: Past Surgical History:  Procedure Laterality Date  . CERVICAL BIOPSY  W/ LOOP ELECTRODE EXCISION  2002  . LAPAROSCOPIC BILATERAL SALPINGECTOMY Bilateral 09/12/2015   Procedure: LAPAROSCOPIC BILATERAL SALPINGECTOMY;  Surgeon: Emily Filbert, MD;  Location: [facility];  Service: Gynecology;  Laterality: Bilateral;  . LAPAROSCOPIC LYSIS OF ADHESIONS  09/12/2015   Procedure: LAPAROSCOPIC LYSIS OF ADHESIONS;  Surgeon: Emily Filbert, MD;  Location: [facility];  Service: Gynecology;;  . TUBAL LIGATION    . WISDOM TOOTH EXTRACTION      FAMILY HISTORY: Family History  Problem Relation Age of Onset  . Hypertension Maternal Grandfather   . Cancer Paternal Grandfather        lung cancer  . Cancer Paternal Grandmother         brain  . Hyperlipidemia Mother   . Diabetes Maternal Aunt   . Hypertension Maternal Aunt     SOCIAL HISTORY:  Social History   Socioeconomic History  . Marital status: Married    Spouse name: Not on file  . Number of children: 1  . Years of education: 24  . Highest education level: Bachelor's degree (e.g., BA, AB, BS)  Occupational History  . Occupation: homemaker  Tobacco Use  . Smoking status: Never Smoker  . Smokeless tobacco: Never Used  Vaping Use  . Vaping Use: Never used  Substance and Sexual Activity  . Alcohol use: No    Alcohol/week: 0.0 standard drinks  . Drug use: No  . Sexual activity: Yes  Partners: Male    Birth control/protection: Surgical  Other Topics Concern  . Not on file  Social History Narrative   Lives with husband   Caffeine use: none   Right handed    Social Determinants of Health   Financial Resource Strain: Not on file  Food Insecurity: Not on file  Transportation Needs: Not on file  Physical Activity: Not on file  Stress: Not on file  Social Connections: Not on file  Intimate Partner Violence: Not on file     PHYSICAL EXAM  Vitals:   02/12/21 0950  BP: 133/83  Pulse: 73  Weight: 229 lb (103.9 kg)  Height: 5\' 6"  (1.676 m)    Body mass index is 36.96 kg/m.   General: The patient is well-developed and well-nourished and in no acute distress  Neck: Range of motion is normal in the neck.  There is no tenderness in the occiput over the splenius capitis muscles.  Neurologic Exam  Mental status: The patient is alert and oriented x 3 at the time of the examination. The patient has apparent normal recent and remote memory, with an apparently normal attention span and concentration ability.   Speech is normal.  Cranial nerves: Extraocular movements are full.  Facial strength and sensation is normal.  The tongue is midline, and the patient has symmetric elevation of the soft palate. No obvious hearing deficits are  noted.  Motor:  Muscle bulk is normal.   Strength is normal in the arms  Sensory:   Intact touch x 4  Coordination: She has good finger-nose-finger bilaterally.  Gait and station: Station is normal.   Gait is normal. Tandem gait is normal. Romberg is negative.     DIAGNOSTIC DATA (LABS, IMAGING, TESTING) - I reviewed patient records, labs, notes, testing and imaging myself where available.  Lab Results  Component Value Date   WBC 7.6 05/09/2020   HGB 15.3 05/09/2020   HCT 46.1 (H) 05/09/2020   MCV 86.2 05/09/2020   PLT 309 05/09/2020      Component Value Date/Time   NA 139 05/29/2020 1434   K 4.3 05/29/2020 1434   CL 102 05/29/2020 1434   CO2 24 05/29/2020 1434   GLUCOSE 103 (H) 05/29/2020 1434   GLUCOSE 116 (H) 05/09/2020 0927   GLUCOSE 82 05/18/2012 1328   BUN 13 05/29/2020 1434   CREATININE 0.75 05/29/2020 1434   CREATININE 0.72 05/09/2020 0927   CALCIUM 9.9 05/29/2020 1434   PROT 7.6 05/29/2020 1434   ALBUMIN 4.7 05/29/2020 1434   AST 30 05/29/2020 1434   ALT 51 (H) 05/29/2020 1434   ALKPHOS 91 05/29/2020 1434   BILITOT 0.3 05/29/2020 1434   GFRNONAA 101 05/29/2020 1434   GFRNONAA 106 05/09/2020 0927   GFRAA 117 05/29/2020 1434   GFRAA 123 05/09/2020 0927   Lab Results  Component Value Date   CHOL 196 05/09/2020   HDL 45 (L) 05/09/2020   LDLCALC 124 (H) 05/09/2020   TRIG 155 (H) 05/09/2020   CHOLHDL 4.4 05/09/2020   Lab Results  Component Value Date   HGBA1C 5.8 (H) 05/09/2020   Lab Results  Component Value Date   [id number] 462 05/29/2020   Lab Results  Component Value Date   TSH 1.45 05/09/2020       ASSESSMENT AND PLAN  1. Migraine without aura and without status migrainosus, not intractable       1.   Qulipta 30 mg daily.    She has tried and failed multipl  medications.    Consider zonisamide in combination if partial benefit.   2.   Continue prn Imitrex 3.   Stay active and exercise as tolerated. 4..  Return in 6 months, sooner  if new or worsening neurologic issues  Richard A. Felecia Shelling, MD, [facility] 08/28/6393, 3:20 PM Certified in Neurology, Clinical Neurophysiology, Sleep Medicine, Pain Medicine and Neuroimaging  [facility] [address] [place], [place] [address] [phone]

## 2021-02-13 NOTE — Telephone Encounter (Signed)
Received fax from [facility] complete that pt qualifies to receive Qulipta at no cost through the [facility] complete program for up to 24 months or until insurance is obtained. No charge product provided as part of the Qulipta complete program cannot be submitted for reimbursement from any third party payor. Patients medication will be shipped to pt directly by [facility], the dispensing pharmacy. [facility] will contact patient to schedule delivery. Phone# if any questions: [phone].

## 2021-02-22 ENCOUNTER — Other Ambulatory Visit (HOSPITAL_BASED_OUTPATIENT_CLINIC_OR_DEPARTMENT_OTHER): Payer: Self-pay

## 2021-03-06 ENCOUNTER — Other Ambulatory Visit (HOSPITAL_COMMUNITY): Payer: Self-pay

## 2021-03-06 MED FILL — Nadolol Tab 20 MG: ORAL | 90 days supply | Qty: 270 | Fill #0 | Status: AC

## 2021-03-06 MED FILL — Sumatriptan Succinate Tab 100 MG: ORAL | 30 days supply | Qty: 9 | Fill #0 | Status: AC

## 2021-03-07 ENCOUNTER — Other Ambulatory Visit (HOSPITAL_COMMUNITY): Payer: Self-pay

## 2021-03-08 ENCOUNTER — Other Ambulatory Visit (HOSPITAL_COMMUNITY): Payer: Self-pay

## 2021-04-19 ENCOUNTER — Other Ambulatory Visit: Payer: Self-pay | Admitting: Neurology

## 2021-04-19 ENCOUNTER — Other Ambulatory Visit (HOSPITAL_COMMUNITY): Payer: Self-pay

## 2021-04-19 DIAGNOSIS — M542 Cervicalgia: Secondary | ICD-10-CM

## 2021-04-19 MED ORDER — CYCLOBENZAPRINE HCL 5 MG PO TABS
ORAL_TABLET | ORAL | 0 refills | Status: DC
  Filled 2021-04-19: qty 90, 90d supply, fill #0

## 2021-04-20 ENCOUNTER — Other Ambulatory Visit (HOSPITAL_COMMUNITY): Payer: Self-pay

## 2021-05-03 ENCOUNTER — Encounter: Payer: Self-pay | Admitting: Family Medicine

## 2021-05-03 ENCOUNTER — Ambulatory Visit (INDEPENDENT_AMBULATORY_CARE_PROVIDER_SITE_OTHER): Payer: 59 | Admitting: Family Medicine

## 2021-05-03 ENCOUNTER — Other Ambulatory Visit: Payer: Self-pay

## 2021-05-03 VITALS — BP 118/66 | HR 80 | Ht 66.0 in | Wt 223.0 lb

## 2021-05-03 DIAGNOSIS — E7849 Other hyperlipidemia: Secondary | ICD-10-CM

## 2021-05-03 DIAGNOSIS — R7303 Prediabetes: Secondary | ICD-10-CM | POA: Diagnosis not present

## 2021-05-03 DIAGNOSIS — Z0000 Encounter for general adult medical examination without abnormal findings: Secondary | ICD-10-CM | POA: Diagnosis not present

## 2021-05-03 NOTE — Progress Notes (Signed)
Subjective:     Lisa Cannon is a 40 y.o. female and is here for a comprehensive physical exam. The patient reports no problems.  She says she has been exercising little bit more regularly for about 15 minutes at home 2 to 3 days a week.  She recently started Optivia to lose weight her goal is to get down to less than 200 pounds.  She does not currently have advance directives.  Lives with her husband Lisa Cannon and daughter Lisa Cannon who is a third Well[employer] geologist.  She says her headaches are finally improving after she recently started [place].  It honestly has been life-changing.  She started to get more active.  She has had more energy and she is recently started exercising.  Social History   Socioeconomic History  . Marital status: Married    Spouse name: Lisa Cannon  . Number of children: 1  . Years of education: 24  . Highest education level: Bachelor's degree (e.g., BA, AB, BS)  Occupational History  . Occupation: homemaker  Tobacco Use  . Smoking status: Never Smoker  . Smokeless tobacco: Never Used  Vaping Use  . Vaping Use: Never used  Substance and Sexual Activity  . Alcohol use: No    Alcohol/week: 0.0 standard drinks  . Drug use: No  . Sexual activity: Yes    Partners: Male    Birth control/protection: Surgical  Other Topics Concern  . Not on file  Social History Narrative   Lives with husband Lisa Cannon and daughter Lisa Cannon    Caffeine use: none   Right handed    Social Determinants of Health   Financial Resource Strain: Not on file  Food Insecurity: Not on file  Transportation Needs: Not on file  Physical Activity: Not on file  Stress: Not on file  Social Connections: Not on file  Intimate Partner Violence: Not on file   Health Maintenance  Topic Date Due  . INFLUENZA VACCINE  07/02/2021  . TETANUS/TDAP  03/20/2022  . PAP SMEAR-Modifier  12/27/2022  . Zoster Vaccines- Shingrix (1 of 2) 04/09/2032  . COVID-19 Vaccine  Completed  . Hepatitis C Screening  Completed  . HIV Screening   Completed  . HPV VACCINES  Aged Out    The following portions of the patient's history were reviewed and updated as appropriate: allergies, current medications, past family history, past medical history, past social history, past surgical history and problem list.  Review of Systems A comprehensive review of systems was negative.   Objective:    BP 118/66   Pulse 80   Ht 5\' 6"  (1.676 m)   Wt 223 lb (101.2 kg)   LMP 04/12/2021 (Exact Date)   SpO2 100%   BMI 35.99 kg/m  General appearance: alert, cooperative and appears stated age Head: Normocephalic, without obvious abnormality, atraumatic Eyes: conj clear, EOMi, PEERLA Ears: normal TM's and external ear canals both ears Nose: Nares normal. Septum midline. Mucosa normal. No drainage or sinus tenderness. Throat: lips, mucosa, and tongue normal; teeth and gums normal Neck: no adenopathy, no carotid bruit, no JVD, supple, symmetrical, trachea midline and thyroid not enlarged, symmetric, no tenderness/mass/nodules Back: symmetric, no curvature. ROM normal. No CVA tenderness. Lungs: clear to auscultation bilaterally Heart: regular rate and rhythm, S1, S2 normal, no murmur, click, rub or gallop Abdomen: soft, non-tender; bowel sounds normal; no masses,  no organomegaly Extremities: extremities normal, atraumatic, no cyanosis or edema Pulses: 2+ and symmetric Skin: Skin color, texture, turgor normal. No rashes or lesions Lymph  nodes: Cervical adenopathy: nl and Supraclavicular adenopathy: nl Neurologic: Alert and oriented X 3, normal strength and tone. Normal symmetric reflexes. Normal coordination and gait    Assessment:    Healthy female exam.      Plan:     See After Visit Summary for Counseling Recommendations   Keep up a regular exercise program and make sure you are eating a healthy diet Try to eat 4 servings of dairy a day, or if you are lactose intolerant take a calcium with vitamin D daily.  Your vaccines are up to  date.  Due for labs.   Encouraged her to complete a living will and advanced directives.

## 2021-05-04 ENCOUNTER — Encounter: Payer: Self-pay | Admitting: Family Medicine

## 2021-05-04 LAB — LIPID PANEL
Cholesterol: 212 mg/dL — ABNORMAL HIGH (ref <200)
HDL: 43 mg/dL — ABNORMAL LOW (ref >=50)
LDL Cholesterol (Calc): 142 mg/dL (calc) — ABNORMAL HIGH
Non-HDL Cholesterol (Calc): 169 mg/dL (calc) — ABNORMAL HIGH (ref <130)
Total CHOL/HDL Ratio: 4.9 (calc) (ref <5.0)
Triglycerides: 144 mg/dL (ref <150)

## 2021-05-04 LAB — CBC
HCT: 46.3 % — ABNORMAL HIGH (ref 35.0–45.0)
Hemoglobin: 15.6 g/dL — ABNORMAL HIGH (ref 11.7–15.5)
MCH: 29.4 pg (ref 27.0–33.0)
MCHC: 33.7 g/dL (ref 32.0–36.0)
MCV: 87.2 fL (ref 80.0–100.0)
MPV: 11.5 fL (ref 7.5–12.5)
Platelets: 278 10*3/uL (ref 140–400)
RBC: 5.31 10*6/uL — ABNORMAL HIGH (ref 3.80–5.10)
RDW: 12.5 % (ref 11.0–15.0)
WBC: 6.6 10*3/uL (ref 3.8–10.8)

## 2021-05-04 LAB — COMPLETE METABOLIC PANEL WITH GFR
AG Ratio: 1.5 (calc) (ref 1.0–2.5)
ALT: 28 U/L (ref 6–29)
AST: 19 U/L (ref 10–30)
Albumin: 4.7 g/dL (ref 3.6–5.1)
Alkaline phosphatase (APISO): 71 U/L (ref 31–125)
BUN: 10 mg/dL (ref 7–25)
CO2: 29 mmol/L (ref 20–32)
Calcium: 10.1 mg/dL (ref 8.6–10.2)
Chloride: 102 mmol/L (ref 98–110)
Creat: 0.86 mg/dL (ref 0.50–1.10)
GFR, Est African American: 99 mL/min/{1.73_m2} (ref >=60)
GFR, Est Non African American: 85 mL/min/{1.73_m2} (ref >=60)
Globulin: 3.1 g/dL (calc) (ref 1.9–3.7)
Glucose, Bld: 102 mg/dL — ABNORMAL HIGH (ref 65–99)
Potassium: 4.6 mmol/L (ref 3.5–5.3)
Sodium: 139 mmol/L (ref 135–146)
Total Bilirubin: 0.6 mg/dL (ref 0.2–1.2)
Total Protein: 7.8 g/dL (ref 6.1–8.1)

## 2021-05-04 LAB — HEMOGLOBIN A1C
Hgb A1c MFr Bld: 6 % of total Hgb — ABNORMAL HIGH (ref <5.7)
Mean Plasma Glucose: 126 mg/dL
eAG (mmol/L): 7 mmol/L

## 2021-05-28 ENCOUNTER — Other Ambulatory Visit (HOSPITAL_COMMUNITY): Payer: Self-pay

## 2021-05-28 MED FILL — Sumatriptan Succinate Tab 100 MG: ORAL | 30 days supply | Qty: 9 | Fill #1 | Status: AC

## 2021-06-05 MED FILL — Nadolol Tab 20 MG: ORAL | 90 days supply | Qty: 270 | Fill #1 | Status: AC

## 2021-06-06 ENCOUNTER — Other Ambulatory Visit (HOSPITAL_COMMUNITY): Payer: Self-pay

## 2021-07-11 ENCOUNTER — Telehealth: Payer: Self-pay | Admitting: *Deleted

## 2021-07-11 NOTE — Telephone Encounter (Signed)
Submitted PA Quilipta via fax to [facility] at [phone]. Received fax confirmation, waiting on determination.

## 2021-07-12 ENCOUNTER — Encounter: Payer: Self-pay | Admitting: Family Medicine

## 2021-07-12 ENCOUNTER — Ambulatory Visit (INDEPENDENT_AMBULATORY_CARE_PROVIDER_SITE_OTHER): Payer: 59 | Admitting: Family Medicine

## 2021-07-12 ENCOUNTER — Other Ambulatory Visit: Payer: Self-pay

## 2021-07-12 VITALS — BP 128/77 | HR 80 | Ht 66.0 in | Wt 221.0 lb

## 2021-07-12 DIAGNOSIS — L600 Ingrowing nail: Secondary | ICD-10-CM | POA: Diagnosis not present

## 2021-07-12 NOTE — Progress Notes (Signed)
Acute Office Visit  Subjective:    Patient ID: Lisa Cannon, female    DOB: 1981/04/28, 40 y.o.   MRN: [id number]  Chief Complaint  Patient presents with   Ingrown Toenail    HPI Patient is in today for ingrown toe nail.   Patient states she has had trouble with her right great toe being ingrown for the past several years. She has been trying to control it herself by keeping it trimmed, but it has become more painful and irritating for the past week. States it was quite painful, swollen, and red when it first flared up. She has had significant improvement with Epsom salt soaks, but would like to have it removed to prevent it from coming back like this so frequently. Pain today is 1/10 soreness.   She denies any severe pain, abscesses, fevers, rashes.     Past Medical History:  Diagnosis Date   Abnormal Pap smear of cervix    Heartburn    Migraines     Past Surgical History:  Procedure Laterality Date   CERVICAL BIOPSY  W/ LOOP ELECTRODE EXCISION  2002   LAPAROSCOPIC BILATERAL SALPINGECTOMY Bilateral 09/12/2015   Procedure: LAPAROSCOPIC BILATERAL SALPINGECTOMY;  Surgeon: Emily Filbert, MD;  Location: [facility];  Service: Gynecology;  Laterality: Bilateral;   LAPAROSCOPIC LYSIS OF ADHESIONS  09/12/2015   Procedure: LAPAROSCOPIC LYSIS OF ADHESIONS;  Surgeon: Emily Filbert, MD;  Location: [facility];  Service: Gynecology;;   TUBAL LIGATION     WISDOM TOOTH EXTRACTION      Family History  Problem Relation Age of Onset   Hypertension Maternal Grandfather    Cancer Paternal Grandfather        lung cancer   Cancer Paternal Grandmother        brain   Hyperlipidemia Mother    Diabetes Maternal Aunt    Hypertension Maternal Aunt     Social History   Socioeconomic History   Marital status: Married    Spouse name: BJ   Number of children: 1   Years of education: 16   Highest education level: Bachelor's degree (e.g., BA, AB, BS)  Occupational History   Occupation: homemaker   Tobacco Use   Smoking status: Never   Smokeless tobacco: Never  Vaping Use   Vaping Use: Never used  Substance and Sexual Activity   Alcohol use: No    Alcohol/week: 0.0 standard drinks   Drug use: No   Sexual activity: Yes    Partners: Male    Birth control/protection: Surgical  Other Topics Concern   Not on file  Social History Narrative   Lives with husband BJ and daughter [name]    Caffeine use: none   Right handed    Social Determinants of Health   Financial Resource Strain: Not on file  Food Insecurity: Not on file  Transportation Needs: Not on file  Physical Activity: Not on file  Stress: Not on file  Social Connections: Not on file  Intimate Partner Violence: Not on file    Outpatient Medications Prior to Visit  Medication Sig Dispense Refill   Atogepant (QULIPTA) 30 MG TABS Take 1 tablet by mouth daily.     cyclobenzaprine (FLEXERIL) 5 MG tablet TAKE 1 TABLET BY MOUTH ONCE DAILY AS NEEDED FOR NECK PAIN 90 tablet 0   fluticasone (FLONASE) 50 MCG/ACT nasal spray Place into both nostrils as needed for allergies or rhinitis.     Multiple Vitamin (MULTIVITAMIN) capsule Take 1 capsule by mouth  daily.     nadolol (CORGARD) 20 MG tablet TAKE 3 TABLETS BY MOUTH DAILY 270 tablet 3   naproxen sodium (ALEVE) 220 MG tablet Take 220 mg by mouth daily as needed (for pain).     Probiotic Product (PROBIOTIC PO) Take by mouth.     SUMAtriptan (IMITREX) 100 MG tablet TAKE 1 TABLET BY MOUTH AT ONSET OF HEADACHE. MAY REPEAT DOSE AFTER 2 HOURS. DO NOT TAKE MORE THAN 2 DAYS A WEEK. 30 tablet 3   No facility-administered medications prior to visit.    No Known Allergies  Review of Systems All review of systems negative except what is listed in the HPI     Objective:    Physical Exam Vitals reviewed.  Constitutional:      Appearance: Normal appearance.  Skin:    Capillary Refill: Capillary refill takes less than 2 seconds.     Findings: No rash.     Comments: R great toe  nail: see picture, mild erythema, no drainage today, no abscess, minimal tenderness to palpation  Neurological:     Mental Status: She is alert and oriented to person, place, and time. Mental status is at baseline.  Psychiatric:        Mood and Affect: Mood normal.        Behavior: Behavior normal.        Thought Content: Thought content normal.        Judgment: Judgment normal.        BP 128/77   Pulse 80   Ht '5\' 6"'$  (1.676 m)   Wt 221 lb (100.2 kg)   SpO2 100%   BMI 35.67 kg/m  Wt Readings from Last 3 Encounters:  07/12/21 221 lb (100.2 kg)  05/03/21 223 lb (101.2 kg)  02/12/21 229 lb (103.9 kg)    There are no preventive care reminders to display for this patient.   There are no preventive care reminders to display for this patient.   Lab Results  Component Value Date   TSH 1.45 05/09/2020   Lab Results  Component Value Date   WBC 6.6 05/03/2021   HGB 15.6 (H) 05/03/2021   HCT 46.3 (H) 05/03/2021   MCV 87.2 05/03/2021   PLT 278 05/03/2021   Lab Results  Component Value Date   NA 139 05/03/2021   K 4.6 05/03/2021   CO2 29 05/03/2021   GLUCOSE 102 (H) 05/03/2021   BUN 10 05/03/2021   CREATININE 0.86 05/03/2021   BILITOT 0.6 05/03/2021   ALKPHOS 91 05/29/2020   AST 19 05/03/2021   ALT 28 05/03/2021   PROT 7.8 05/03/2021   ALBUMIN 4.7 05/29/2020   CALCIUM 10.1 05/03/2021   Lab Results  Component Value Date   CHOL 212 (H) 05/03/2021   Lab Results  Component Value Date   HDL 43 (L) 05/03/2021   Lab Results  Component Value Date   LDLCALC 142 (H) 05/03/2021   Lab Results  Component Value Date   TRIG 144 05/03/2021   Lab Results  Component Value Date   CHOLHDL 4.9 05/03/2021   Lab Results  Component Value Date   HGBA1C 6.0 (H) 05/03/2021       Assessment & Plan:   1. Nail, ingrown R great toenail ingrown. Unable to remove at this office visit. Educated patient on procedure and risks vs benefits. She would like to schedule to come  back and have it removed (either with PCP or Dr. Darene Lamer, whoever is available first). Encouraged her to  continue epsom salt soaks in the meantime and watch for signs of infection. Patient aware of signs/symptoms requiring further/urgent evaluation.  Follow-up at your convenience for nail removal.   Lovena Le B. Olevia Bowens, DNP, FNP-C

## 2021-07-16 ENCOUNTER — Other Ambulatory Visit: Payer: Self-pay | Admitting: Neurology

## 2021-07-16 DIAGNOSIS — M542 Cervicalgia: Secondary | ICD-10-CM

## 2021-07-16 NOTE — Telephone Encounter (Signed)
Called [employer] to check on status of PA. Spoke w/ Shantell. PA approved 07/12/21-01/11/22. Approval# [id number]. She will fax approval letter to our office at [phone].

## 2021-07-17 ENCOUNTER — Other Ambulatory Visit (HOSPITAL_COMMUNITY): Payer: Self-pay

## 2021-07-17 MED ORDER — CYCLOBENZAPRINE HCL 5 MG PO TABS
ORAL_TABLET | ORAL | 0 refills | Status: DC
  Filled 2021-07-17: qty 90, 90d supply, fill #0

## 2021-07-19 ENCOUNTER — Ambulatory Visit: Payer: 59 | Admitting: Sports Medicine

## 2021-07-27 ENCOUNTER — Ambulatory Visit: Payer: 59 | Admitting: Sports Medicine

## 2021-07-27 ENCOUNTER — Other Ambulatory Visit: Payer: Self-pay

## 2021-07-27 ENCOUNTER — Other Ambulatory Visit (HOSPITAL_COMMUNITY): Payer: Self-pay

## 2021-07-27 DIAGNOSIS — L600 Ingrowing nail: Secondary | ICD-10-CM | POA: Insufficient documentation

## 2021-07-27 MED ORDER — HYDROCODONE-ACETAMINOPHEN 10-325 MG PO TABS
1.0000 | ORAL_TABLET | Freq: Three times a day (TID) | ORAL | 0 refills | Status: DC | PRN
  Filled 2021-07-27: qty 15, 5d supply, fill #0

## 2021-07-27 NOTE — Progress Notes (Signed)
    Procedures performed today:    Procedure:  Removal of right great lateral toenail. Risks, benefits, alternatives explained to patient. Consent obtained. Time out conducted. Noted no overlying induration or erythema at site of injection. Toe cleaned with alcohol, then a total of 10cc lidocaine 1% infiltrated at adjacent webspaces at the location of the bifurcation of the common digital nerve to proper digital nerves.  Some lidocaine also infiltrated at hyponychium and under nail bed.  Adequate anesthesia ensured. Toe prepped and draped in a sterile fashion. Nail elevator used to separate nail plate from nail bed. Clippers used to cut toenail in a longitudinal fashion to proximal nail fold and matrix. Hemostat then used to separate nail fragment from surrounding structures. Nail bed and matrix treated. Minor bleeding controlled with pressure and phenol. Antibiotic ointment applied. Toe dressed. Advised to return if increased redness, swelling, drainage, fevers, or chills.  Independent interpretation of notes and tests performed by another provider:   None.  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 Today we made the decision to do a lateral nail plate excision with phenol matricectomy. Hydrocodone for postoperative pain. Return to see me in 2 weeks for wound check.    ___________________________________________ Gwen Her. Dianah Field, M.D., ABFM., CAQSM. Primary Care and [place] Instructor of [employer] [facility] of Medicine

## 2021-07-27 NOTE — Assessment & Plan Note (Addendum)
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 Today we made the decision to do a lateral nail plate excision with phenol matricectomy. Hydrocodone for postoperative pain. Return to see me in 2 weeks for wound check.

## 2021-08-07 ENCOUNTER — Other Ambulatory Visit (HOSPITAL_COMMUNITY): Payer: Self-pay

## 2021-08-07 ENCOUNTER — Other Ambulatory Visit: Payer: Self-pay | Admitting: *Deleted

## 2021-08-07 ENCOUNTER — Telehealth: Payer: Self-pay | Admitting: Neurology

## 2021-08-07 MED ORDER — QULIPTA 30 MG PO TABS
1.0000 | ORAL_TABLET | Freq: Every day | ORAL | 5 refills | Status: DC
  Filled 2021-08-07: qty 30, 30d supply, fill #0

## 2021-08-07 NOTE — Telephone Encounter (Signed)
E-scribed refill as requested. 

## 2021-08-07 NOTE — Telephone Encounter (Signed)
Pt called requesting a refill for Atogepant (QULIPTA) 30 MG TABS. Pharmacy [facility].

## 2021-08-08 ENCOUNTER — Other Ambulatory Visit (HOSPITAL_COMMUNITY): Payer: Self-pay

## 2021-08-09 ENCOUNTER — Encounter: Payer: Self-pay | Admitting: *Deleted

## 2021-08-09 ENCOUNTER — Ambulatory Visit: Payer: 59 | Admitting: Family Medicine

## 2021-08-09 ENCOUNTER — Other Ambulatory Visit: Payer: Self-pay

## 2021-08-09 ENCOUNTER — Other Ambulatory Visit (HOSPITAL_COMMUNITY): Payer: Self-pay

## 2021-08-09 ENCOUNTER — Encounter: Payer: Self-pay | Admitting: Family Medicine

## 2021-08-09 VITALS — BP 132/78 | HR 86 | Ht 66.0 in | Wt 220.1 lb

## 2021-08-09 DIAGNOSIS — L2489 Irritant contact dermatitis due to other agents: Secondary | ICD-10-CM | POA: Diagnosis not present

## 2021-08-09 DIAGNOSIS — L03115 Cellulitis of right lower limb: Secondary | ICD-10-CM

## 2021-08-09 MED ORDER — CEPHALEXIN 500 MG PO CAPS
500.0000 mg | ORAL_CAPSULE | Freq: Two times a day (BID) | ORAL | 0 refills | Status: DC
  Filled 2021-08-09: qty 14, 7d supply, fill #0

## 2021-08-09 NOTE — Progress Notes (Signed)
Acute Office Visit  Subjective:    Patient ID: Lisa Cannon, female    DOB: 1981-02-04, 40 y.o.   MRN: [id number]  No chief complaint on file.   HPI Patient is in today for ingrown right toenail.  She was seen initially on August 8 by one of my partners.  She then returned on August 26 for partial nail removal with Dr. Dianah Field.  She said she left the bandage on for 48 hours as instructed but it felt very uncomfortable and tight and so by the time she took it off she had blisters on the skin.  She says since then she has been using some Neosporin and trying to keep it covered she is also been doing Epson salt soaks twice a day.  She is concerned because it is continuing to look full of fluid and blister and red and is worried that the skin may not be healing.  Past Medical History:  Diagnosis Date   Abnormal Pap smear of cervix    Heartburn    Migraines     Past Surgical History:  Procedure Laterality Date   CERVICAL BIOPSY  W/ LOOP ELECTRODE EXCISION  2002   LAPAROSCOPIC BILATERAL SALPINGECTOMY Bilateral 09/12/2015   Procedure: LAPAROSCOPIC BILATERAL SALPINGECTOMY;  Surgeon: Emily Filbert, MD;  Location: [facility];  Service: Gynecology;  Laterality: Bilateral;   LAPAROSCOPIC LYSIS OF ADHESIONS  09/12/2015   Procedure: LAPAROSCOPIC LYSIS OF ADHESIONS;  Surgeon: Emily Filbert, MD;  Location: [facility];  Service: Gynecology;;   TUBAL LIGATION     WISDOM TOOTH EXTRACTION      Family History  Problem Relation Age of Onset   Hypertension Maternal Grandfather    Cancer Paternal Grandfather        lung cancer   Cancer Paternal Grandmother        brain   Hyperlipidemia Mother    Diabetes Maternal Aunt    Hypertension Maternal Aunt     Social History   Socioeconomic History   Marital status: Married    Spouse name: BJ   Number of children: 1   Years of education: 16   Highest education level: Bachelor's degree (e.g., BA, AB, BS)  Occupational History   Occupation: homemaker   Tobacco Use   Smoking status: Never   Smokeless tobacco: Never  Vaping Use   Vaping Use: Never used  Substance and Sexual Activity   Alcohol use: No    Alcohol/week: 0.0 standard drinks   Drug use: No   Sexual activity: Yes    Partners: Male    Birth control/protection: Surgical  Other Topics Concern   Not on file  Social History Narrative   Lives with husband BJ and daughter [name]    Caffeine use: none   Right handed    Social Determinants of Health   Financial Resource Strain: Not on file  Food Insecurity: Not on file  Transportation Needs: Not on file  Physical Activity: Not on file  Stress: Not on file  Social Connections: Not on file  Intimate Partner Violence: Not on file    Outpatient Medications Prior to Visit  Medication Sig Dispense Refill   Atogepant (QULIPTA) 30 MG TABS Take 1 tablet by mouth daily. 30 tablet 5   cyclobenzaprine (FLEXERIL) 5 MG tablet TAKE 1 TABLET BY MOUTH ONCE DAILY AS NEEDED FOR NECK PAIN 90 tablet 0   fluticasone (FLONASE) 50 MCG/ACT nasal spray Place into both nostrils as needed for allergies or rhinitis.  Multiple Vitamin (MULTIVITAMIN) capsule Take 1 capsule by mouth daily.     nadolol (CORGARD) 20 MG tablet TAKE 3 TABLETS BY MOUTH DAILY 270 tablet 3   naproxen sodium (ALEVE) 220 MG tablet Take 220 mg by mouth daily as needed (for pain).     Probiotic Product (PROBIOTIC PO) Take by mouth.     SUMAtriptan (IMITREX) 100 MG tablet TAKE 1 TABLET BY MOUTH AT ONSET OF HEADACHE. MAY REPEAT DOSE AFTER 2 HOURS. DO NOT TAKE MORE THAN 2 DAYS A WEEK. 30 tablet 3   HYDROcodone-acetaminophen (NORCO) 10-325 MG tablet Take 1 tablet by mouth every 8 (eight) hours as needed. (Patient not taking: Reported on 08/09/2021) 15 tablet 0   No facility-administered medications prior to visit.    No Known Allergies  Review of Systems     Objective:    Physical Exam  BP 132/78   Pulse 86   Ht '5\' 6"'$  (1.676 m)   Wt 220 lb 1.9 oz (99.8 kg)   SpO2 99%    BMI 35.53 kg/m  Wt Readings from Last 3 Encounters:  08/09/21 220 lb 1.9 oz (99.8 kg)  07/12/21 221 lb (100.2 kg)  05/03/21 223 lb (101.2 kg)    There are no preventive care reminders to display for this patient.  There are no preventive care reminders to display for this patient.   Lab Results  Component Value Date   TSH 1.45 05/09/2020   Lab Results  Component Value Date   WBC 6.6 05/03/2021   HGB 15.6 (H) 05/03/2021   HCT 46.3 (H) 05/03/2021   MCV 87.2 05/03/2021   PLT 278 05/03/2021   Lab Results  Component Value Date   NA 139 05/03/2021   K 4.6 05/03/2021   CO2 29 05/03/2021   GLUCOSE 102 (H) 05/03/2021   BUN 10 05/03/2021   CREATININE 0.86 05/03/2021   BILITOT 0.6 05/03/2021   ALKPHOS 91 05/29/2020   AST 19 05/03/2021   ALT 28 05/03/2021   PROT 7.8 05/03/2021   ALBUMIN 4.7 05/29/2020   CALCIUM 10.1 05/03/2021   Lab Results  Component Value Date   CHOL 212 (H) 05/03/2021   Lab Results  Component Value Date   HDL 43 (L) 05/03/2021   Lab Results  Component Value Date   LDLCALC 142 (H) 05/03/2021   Lab Results  Component Value Date   TRIG 144 05/03/2021   Lab Results  Component Value Date   CHOLHDL 4.9 05/03/2021   Lab Results  Component Value Date   HGBA1C 6.0 (H) 05/03/2021       Assessment & Plan:   Problem List Items Addressed This Visit   None Visit Diagnoses     Cellulitis of right lower extremity    -  Primary   Irritant contact dermatitis due to other agents          Erythema and inflammation of the right great toe secondary to partial nail removal for an ingrown nail.  At this point it is honestly difficult to say how much of this is related to just contact dermatitis and inflammation versus true infection.  So for now we will have her stop the peroxide cleanings as well as applying Neosporin.  Just clean with regular soap and water in the shower.  Pat dry.  Apply Vaseline and keep it uncovered.  Okay to wear open sandals.   But also prescribed Keflex to cover for any secondary bacterial infection.  She will be going to the beach  next week so please reach out if any problems or concerns or if she feels like its not healing or suddenly getting worse.  Continue Epson salt soaks twice a day if able to.  Stop the peroxide and Neosporin.    Meds ordered this encounter  Medications   cephALEXin (KEFLEX) 500 MG capsule    Sig: Take 1 capsule (500 mg total) by mouth 2 (two) times daily.    Dispense:  14 capsule    Refill:  0     Beatrice Lecher, MD

## 2021-08-20 ENCOUNTER — Other Ambulatory Visit (HOSPITAL_COMMUNITY): Payer: Self-pay

## 2021-08-20 ENCOUNTER — Encounter: Payer: Self-pay | Admitting: Neurology

## 2021-08-20 ENCOUNTER — Other Ambulatory Visit: Payer: Self-pay | Admitting: Neurology

## 2021-08-20 ENCOUNTER — Ambulatory Visit: Payer: 59 | Admitting: Neurology

## 2021-08-20 VITALS — BP 133/89 | HR 72 | Ht 66.0 in | Wt 221.0 lb

## 2021-08-20 DIAGNOSIS — M542 Cervicalgia: Secondary | ICD-10-CM

## 2021-08-20 DIAGNOSIS — G43009 Migraine without aura, not intractable, without status migrainosus: Secondary | ICD-10-CM

## 2021-08-20 MED ORDER — NADOLOL 20 MG PO TABS
ORAL_TABLET | Freq: Every day | ORAL | 3 refills | Status: DC
  Filled 2021-08-20: qty 270, 90d supply, fill #0
  Filled 2021-11-17: qty 270, 90d supply, fill #1
  Filled 2022-02-18: qty 270, 90d supply, fill #2
  Filled 2022-05-14: qty 270, 90d supply, fill #3

## 2021-08-20 MED ORDER — QULIPTA 30 MG PO TABS
1.0000 | ORAL_TABLET | Freq: Every day | ORAL | 4 refills | Status: DC
  Filled 2021-08-20: qty 90, 90d supply, fill #0
  Filled 2021-12-14: qty 90, 90d supply, fill #1
  Filled 2022-03-15: qty 90, 90d supply, fill #2
  Filled 2022-06-15: qty 90, 90d supply, fill #3
  Filled 2022-08-17: qty 90, 90d supply, fill #4

## 2021-08-20 NOTE — Progress Notes (Signed)
[facility]  PATIENT: Lisa Cannon DOB: 12/11/1980  REFERRING DOCTOR OR PCP:  Dr. Madilyn Fireman SOURCE: patient, notes from PCP, labs  _________________________________   HISTORICAL  CHIEF COMPLAINT:  Chief Complaint  Patient presents with   Follow-up    RM 1, alone. Last seen 02/12/21. Doing much better on Qulipta. No concerns at this time.    HISTORY OF PRESENT ILLNESS:  Lisa Cannon is a 40 yo woman with chronic migraine headaches.  Update 08/20/2021: She is doing much better on Qulipta.   She has had only 3-4 migraine days a month (2-3 around her cycle and one 1-2 random).   When one occurs, she takes an Aleve and/or sumatriptan.     Prior to [place] she had chronic migraine 25/30 days a month > 4 hours a day.    Headaches are left greater than right associated with a throbbing quality and pressure.  There is often nausea but no vomiting.  She has photophobia and phonophobia.  Moving the head intensifies the pain.  Imitrex will sometimes help short-term but the pain comes back  Her neck pain is doing better as well.  She had Vyepti in February 2021and did well x 7-8 weeks. This was better than any medication tried.  However the additional IV infusions (2 more) had not helped.    She had little benefit with the second or third injections though.   Aimovig and Emgality did not have much benefit.     Botox worsened the pain.    She continues to have pain and pressure in her face and head on a daily basis.   She still takes flexeril 5 mg po tid as it had helped her some.     Topiramate has been tried a couple times but she did not get a benefit and also had side effects.  Propranolol, Effexor, amitriptyline, Corgard, have been tried in the past as well with minimal to no benefit or side effects.   Keppra in the past caused disorientation.     Migraine History: She is a 40 year old woman who has had migraines since age 63.   Initially, headaches were sporadic but  they have become more chronic.     Currently, she has 16-20 migraine days a month for 4 or more hours a day.   She does not have aura.  Migraines are left frontal with a throbbing knifelike quality.   She often has pressure in her eyes and pain in the back of the head.   She sometimes has nausea and no vomiting.    She has photophobia and phonophobia.   During her cycle, HA's are daily and the rest of the month they are intermittent. Moving her head intensifies the pain.    Laying down does not help.     Imitrex will usually help for the rest of the day but then the HA returns.          Medication tried: She was on gabapentin for over a year but had no benefit and actually did better when she stopped.    Topiramate was tried twice with no benefit and some side effects.   A combination of Corgard and Flexeril helped mildly (but only 20-25% reduction in frequency).   She has tried and failed propranolol.   Effexor or amitriptyline did not help and the tricyclic caused side effects.    Aimovig had not helped (did x 1 week only) .   Botox made HA  worse.   Emgality did not help.  Vyepti helped the first course but not 2nd or third course so she stopped   REVIEW OF SYSTEMS: Constitutional: No fevers, chills, sweats, or change in appetite Eyes: No visual changes, double vision, eye pain Ear, nose and throat: No hearing loss, ear pain, nasal congestion, sore throat Cardiovascular: No chest pain, palpitations Respiratory:  No shortness of breath at rest or with exertion.   No wheezes GastrointestinaI: No nausea, vomiting, diarrhea, abdominal pain, fecal incontinence Genitourinary:  No dysuria, urinary retention or frequency.  No nocturia. Musculoskeletal:  No neck pain, back pain Integumentary: No rash, pruritus, skin lesions Neurological: as above Psychiatric: No depression at this time.  No anxiety Endocrine: No palpitations, diaphoresis, change in appetite, change in weigh or increased  thirst Hematologic/Lymphatic:  No anemia, purpura, petechiae. Allergic/Immunologic: Has seasonal allergies  ALLERGIES: No Known Allergies  HOME MEDICATIONS:  Current Outpatient Medications:    cyclobenzaprine (FLEXERIL) 5 MG tablet, TAKE 1 TABLET BY MOUTH ONCE DAILY AS NEEDED FOR NECK PAIN, Disp: 90 tablet, Rfl: 0   fluticasone (FLONASE) 50 MCG/ACT nasal spray, Place into both nostrils as needed for allergies or rhinitis., Disp: , Rfl:    Multiple Vitamin (MULTIVITAMIN) capsule, Take 1 capsule by mouth daily., Disp: , Rfl:    nadolol (CORGARD) 20 MG tablet, TAKE 3 TABLETS BY MOUTH DAILY, Disp: 270 tablet, Rfl: 3   naproxen sodium (ALEVE) 220 MG tablet, Take 220 mg by mouth daily as needed (for pain)., Disp: , Rfl:    Probiotic Product (PROBIOTIC PO), Take by mouth., Disp: , Rfl:    SUMAtriptan (IMITREX) 100 MG tablet, TAKE 1 TABLET BY MOUTH AT ONSET OF HEADACHE. MAY REPEAT DOSE AFTER 2 HOURS. DO NOT TAKE MORE THAN 2 DAYS A WEEK., Disp: 30 tablet, Rfl: 3   Atogepant (QULIPTA) 30 MG TABS, Take 1 tablet by mouth daily., Disp: 90 tablet, Rfl: 4  PAST MEDICAL HISTORY: Past Medical History:  Diagnosis Date   Abnormal Pap smear of cervix    Heartburn    Migraines     PAST SURGICAL HISTORY: Past Surgical History:  Procedure Laterality Date   CERVICAL BIOPSY  W/ LOOP ELECTRODE EXCISION  2002   LAPAROSCOPIC BILATERAL SALPINGECTOMY Bilateral 09/12/2015   Procedure: LAPAROSCOPIC BILATERAL SALPINGECTOMY;  Surgeon: Emily Filbert, MD;  Location: [facility];  Service: Gynecology;  Laterality: Bilateral;   LAPAROSCOPIC LYSIS OF ADHESIONS  09/12/2015   Procedure: LAPAROSCOPIC LYSIS OF ADHESIONS;  Surgeon: Emily Filbert, MD;  Location: [facility];  Service: Gynecology;;   TUBAL LIGATION     WISDOM TOOTH EXTRACTION      FAMILY HISTORY: Family History  Problem Relation Age of Onset   Hypertension Maternal Grandfather    Cancer Paternal Grandfather        lung cancer   Cancer Paternal Grandmother         brain   Hyperlipidemia Mother    Diabetes Maternal Aunt    Hypertension Maternal Aunt     SOCIAL HISTORY:  Social History   Socioeconomic History   Marital status: Married    Spouse name: BJ   Number of children: 1   Years of education: 16   Highest education level: Bachelor's degree (e.g., BA, AB, BS)  Occupational History   Occupation: homemaker  Tobacco Use   Smoking status: Never   Smokeless tobacco: Never  Vaping Use   Vaping Use: Never used  Substance and Sexual Activity   Alcohol use: No  Alcohol/week: 0.0 standard drinks   Drug use: No   Sexual activity: Yes    Partners: Male    Birth control/protection: Surgical  Other Topics Concern   Not on file  Social History Narrative   Lives with husband BJ and daughter [name]    Caffeine use: none   Right handed    Social Determinants of Health   Financial Resource Strain: Not on file  Food Insecurity: Not on file  Transportation Needs: Not on file  Physical Activity: Not on file  Stress: Not on file  Social Connections: Not on file  Intimate Partner Violence: Not on file     PHYSICAL EXAM  Vitals:   08/20/21 0952  BP: 133/89  Pulse: 72  Weight: 221 lb (100.2 kg)  Height: '5\' 6"'$  (1.676 m)    Body mass index is 35.67 kg/m.   General: The patient is well-developed and well-nourished and in no acute distress  Neck: Range of motion is normal in the neck.  There is no tenderness in the occiput over the splenius capitis muscles.  Neurologic Exam  Mental status: The patient is alert and oriented x 3 at the time of the examination. The patient has apparent normal recent and remote memory, with an apparently normal attention span and concentration ability.   Speech is normal.  Cranial nerves: Extraocular movements are full.  Facial strength and sensation is normal.  Hearing seems symmetric.  Motor:  Muscle bulk is normal.   Strength is normal in the arms  Sensory:   Intact touch x 4  Coordination: She  has good finger-nose-finger bilaterally.  Gait and station: Station is normal.   Gait and tandem gait are normal..     DIAGNOSTIC DATA (LABS, IMAGING, TESTING) - I reviewed patient records, labs, notes, testing and imaging myself where available.  Lab Results  Component Value Date   WBC 6.6 05/03/2021   HGB 15.6 (H) 05/03/2021   HCT 46.3 (H) 05/03/2021   MCV 87.2 05/03/2021   PLT 278 05/03/2021      Component Value Date/Time   NA 139 05/03/2021 0000   NA 139 05/29/2020 1434   K 4.6 05/03/2021 0000   CL 102 05/03/2021 0000   CO2 29 05/03/2021 0000   GLUCOSE 102 (H) 05/03/2021 0000   GLUCOSE 82 05/18/2012 1328   BUN 10 05/03/2021 0000   BUN 13 05/29/2020 1434   CREATININE 0.86 05/03/2021 0000   CALCIUM 10.1 05/03/2021 0000   PROT 7.8 05/03/2021 0000   PROT 7.6 05/29/2020 1434   ALBUMIN 4.7 05/29/2020 1434   AST 19 05/03/2021 0000   ALT 28 05/03/2021 0000   ALKPHOS 91 05/29/2020 1434   BILITOT 0.6 05/03/2021 0000   BILITOT 0.3 05/29/2020 1434   GFRNONAA 85 05/03/2021 0000   GFRAA 99 05/03/2021 0000   Lab Results  Component Value Date   CHOL 212 (H) 05/03/2021   HDL 43 (L) 05/03/2021   LDLCALC 142 (H) 05/03/2021   TRIG 144 05/03/2021   CHOLHDL 4.9 05/03/2021   Lab Results  Component Value Date   HGBA1C 6.0 (H) 05/03/2021   Lab Results  Component Value Date   [id number] 05/29/2020   Lab Results  Component Value Date   TSH 1.45 05/09/2020       ASSESSMENT AND PLAN  1. Migraine without aura and without status migrainosus, not intractable   2. Neck pain        1.   Continue Qulipta 30 mg daily.  She has tried and failed multiple medications.    Consider zonisamide in combination if benefit tapers off some 2.   Continue prn Imitrex and NSAID 3.   Stay active and exercise as tolerated. 4..  Return in 12 months, sooner if new or worsening neurologic issues  Kearstyn Avitia A. Felecia Shelling, MD, [facility] [id number], [id number] AM Certified in Neurology, Clinical  Neurophysiology, Sleep Medicine, Pain Medicine and Neuroimaging  [facility] [address], [place],  [address] [phone]

## 2021-08-22 ENCOUNTER — Ambulatory Visit: Payer: 59 | Admitting: Sports Medicine

## 2021-08-29 ENCOUNTER — Other Ambulatory Visit (HOSPITAL_COMMUNITY): Payer: Self-pay

## 2021-08-29 ENCOUNTER — Other Ambulatory Visit: Payer: Self-pay | Admitting: Neurology

## 2021-08-29 DIAGNOSIS — G43009 Migraine without aura, not intractable, without status migrainosus: Secondary | ICD-10-CM

## 2021-08-29 MED ORDER — SUMATRIPTAN SUCCINATE 100 MG PO TABS
ORAL_TABLET | ORAL | 3 refills | Status: DC
  Filled 2021-08-29: qty 27, 90d supply, fill #0
  Filled 2021-11-17: qty 27, 90d supply, fill #1
  Filled 2022-02-18: qty 27, 90d supply, fill #2
  Filled 2022-05-22: qty 27, 90d supply, fill #3
  Filled 2022-08-14: qty 12, 40d supply, fill #4

## 2021-08-31 ENCOUNTER — Other Ambulatory Visit (HOSPITAL_COMMUNITY): Payer: Self-pay

## 2021-09-03 ENCOUNTER — Ambulatory Visit: Payer: 59 | Admitting: Family Medicine

## 2021-09-03 ENCOUNTER — Encounter: Payer: Self-pay | Admitting: Family Medicine

## 2021-09-03 ENCOUNTER — Other Ambulatory Visit: Payer: Self-pay

## 2021-09-03 ENCOUNTER — Other Ambulatory Visit (HOSPITAL_COMMUNITY): Payer: Self-pay

## 2021-09-03 VITALS — BP 119/68 | HR 77 | Temp 98.2°F | Ht 65.0 in | Wt 221.0 lb

## 2021-09-03 DIAGNOSIS — Z7689 Persons encountering health services in other specified circumstances: Secondary | ICD-10-CM | POA: Diagnosis not present

## 2021-09-03 DIAGNOSIS — R7303 Prediabetes: Secondary | ICD-10-CM | POA: Insufficient documentation

## 2021-09-03 DIAGNOSIS — E7849 Other hyperlipidemia: Secondary | ICD-10-CM

## 2021-09-03 HISTORY — DX: Prediabetes: R73.03

## 2021-09-03 LAB — POCT GLYCOSYLATED HEMOGLOBIN (HGB A1C): Hemoglobin A1C: 5.8 % — AB (ref 4.0–5.6)

## 2021-09-03 MED ORDER — OZEMPIC (0.25 OR 0.5 MG/DOSE) 2 MG/1.5ML ~~LOC~~ SOPN
0.2500 mg | PEN_INJECTOR | SUBCUTANEOUS | 0 refills | Status: DC
  Filled 2021-09-03: qty 1.5, 56d supply, fill #0

## 2021-09-03 NOTE — Progress Notes (Signed)
Established Patient Office Visit  Subjective:  Patient ID: Lisa Cannon, female    DOB: August 28, 1981  Age: 40 y.o. MRN: [id number]  CC:  Chief Complaint  Patient presents with   Diabetes   Hyperlipidemia    HPI Lisa Cannon presents for   Impaired fasting glucose-no increased thirst or urination. No symptoms consistent with hypoglycemia.  She is not currently on medication for it.  Her last A1c did jump up to ask 0.0.  She had previously been right around 5.7-5.8.  Since then she has been proactive in fact asked about a month before we checked her A1c she had started working out and exercising more regularly she been purposely trying to lose weight.  She was at 230 pounds back in the spring and has weighed 218 on her home scale.  Her goal is to get to less than 200 pounds.  He has really been try to exercise 3 to 4 days a week and has really increased her protein levels she is even been on Optivia to help her.  She has worked with a Engineer, maintenance (IT) as well  Singly she does have a history of excess testosterone and hair growth as well as irregular periods especially when she was younger and difficulty with fertility.  It does raise the question of whether or not she could have PCOS which would certainly put her at higher risk of having prediabetes and diabetes at this point in her life.  She does have some family members with diabetes but no first-degree relative.   Past Medical History:  Diagnosis Date   Abnormal Pap smear of cervix    Heartburn    Migraines     Past Surgical History:  Procedure Laterality Date   CERVICAL BIOPSY  W/ LOOP ELECTRODE EXCISION  2002   LAPAROSCOPIC BILATERAL SALPINGECTOMY Bilateral 09/12/2015   Procedure: LAPAROSCOPIC BILATERAL SALPINGECTOMY;  Surgeon: Emily Filbert, MD;  Location: [facility];  Service: Gynecology;  Laterality: Bilateral;   LAPAROSCOPIC LYSIS OF ADHESIONS  09/12/2015   Procedure: LAPAROSCOPIC LYSIS OF ADHESIONS;  Surgeon: Emily Filbert, MD;   Location: [facility];  Service: Gynecology;;   TUBAL LIGATION     WISDOM TOOTH EXTRACTION      Family History  Problem Relation Age of Onset   Hypertension Maternal Grandfather    Cancer Paternal Grandfather        lung cancer   Cancer Paternal Grandmother        brain   Hyperlipidemia Mother    Diabetes Maternal Aunt    Hypertension Maternal Aunt     Social History   Socioeconomic History   Marital status: Married    Spouse name: BJ   Number of children: 1   Years of education: 16   Highest education level: Bachelor's degree (e.g., BA, AB, BS)  Occupational History   Occupation: homemaker  Tobacco Use   Smoking status: Never   Smokeless tobacco: Never  Vaping Use   Vaping Use: Never used  Substance and Sexual Activity   Alcohol use: No    Alcohol/week: 0.0 standard drinks   Drug use: No   Sexual activity: Yes    Partners: Male    Birth control/protection: Surgical  Other Topics Concern   Not on file  Social History Narrative   Lives with husband BJ and daughter Lisa Cannon    Caffeine use: none   Right handed    Social Determinants of Health   Financial Resource Strain: Not on file  Food Insecurity: Not on file  Transportation Needs: Not on file  Physical Activity: Not on file  Stress: Not on file  Social Connections: Not on file  Intimate Partner Violence: Not on file    Outpatient Medications Prior to Visit  Medication Sig Dispense Refill   Atogepant (QULIPTA) 30 MG TABS Take 1 tablet by mouth daily. 90 tablet 4   cyclobenzaprine (FLEXERIL) 5 MG tablet TAKE 1 TABLET BY MOUTH ONCE DAILY AS NEEDED FOR NECK PAIN 90 tablet 0   fluticasone (FLONASE) 50 MCG/ACT nasal spray Place into both nostrils as needed for allergies or rhinitis.     Multiple Vitamin (MULTIVITAMIN) capsule Take 1 capsule by mouth daily.     nadolol (CORGARD) 20 MG tablet TAKE 3 TABLETS BY MOUTH DAILY 270 tablet 3   naproxen sodium (ALEVE) 220 MG tablet Take 220 mg by mouth daily as needed (for  pain).     Probiotic Product (PROBIOTIC PO) Take by mouth.     SUMAtriptan (IMITREX) 100 MG tablet TAKE 1 TABLET BY MOUTH AT ONSET OF HEADACHE. MAY REPEAT DOSE AFTER 2 HOURS. DO NOT TAKE MORE THAN 2 DAYS A WEEK. 30 tablet 3   No facility-administered medications prior to visit.    No Known Allergies  ROS Review of Systems    Objective:    Physical Exam Constitutional:      Appearance: Normal appearance. She is well-developed.  HENT:     Head: Normocephalic and atraumatic.  Cardiovascular:     Rate and Rhythm: Normal rate and regular rhythm.     Heart sounds: Normal heart sounds.  Pulmonary:     Effort: Pulmonary effort is normal.     Breath sounds: Normal breath sounds.  Skin:    General: Skin is warm and dry.  Neurological:     Mental Status: She is alert and oriented to person, place, and time.  Psychiatric:        Behavior: Behavior normal.    BP 119/68   Pulse 77   Temp 98.2 F (36.8 C)   Ht 5\' 5"  (1.651 m)   Wt 221 lb (100.2 kg)   LMP 08/17/2021   SpO2 99%   BMI 36.78 kg/m  Wt Readings from Last 3 Encounters:  09/03/21 221 lb (100.2 kg)  08/20/21 221 lb (100.2 kg)  08/09/21 220 lb 1.9 oz (99.8 kg)     There are no preventive care reminders to display for this patient.   There are no preventive care reminders to display for this patient.  Lab Results  Component Value Date   TSH 1.45 05/09/2020   Lab Results  Component Value Date   WBC 6.6 05/03/2021   HGB 15.6 (H) 05/03/2021   HCT 46.3 (H) 05/03/2021   MCV 87.2 05/03/2021   PLT 278 05/03/2021   Lab Results  Component Value Date   NA 139 05/03/2021   K 4.6 05/03/2021   CO2 29 05/03/2021   GLUCOSE 102 (H) 05/03/2021   BUN 10 05/03/2021   CREATININE 0.86 05/03/2021   BILITOT 0.6 05/03/2021   ALKPHOS 91 05/29/2020   AST 19 05/03/2021   ALT 28 05/03/2021   PROT 7.8 05/03/2021   ALBUMIN 4.7 05/29/2020   CALCIUM 10.1 05/03/2021   Lab Results  Component Value Date   CHOL 212 (H)  05/03/2021   Lab Results  Component Value Date   HDL 43 (L) 05/03/2021   Lab Results  Component Value Date   LDLCALC 142 (H) 05/03/2021  Lab Results  Component Value Date   TRIG 144 05/03/2021   Lab Results  Component Value Date   CHOLHDL 4.9 05/03/2021   Lab Results  Component Value Date   HGBA1C 5.8 (A) 09/03/2021      Assessment & Plan:   Problem List Items Addressed This Visit       Other   Prediabetes - Primary    She has really done a phenomenal job over the last 4 months and trying to eat more healthy and exercise and lose weight.  But she is getting to the point where she is getting frustrated because she cannot seem to get it to continue to move forward.  We did discuss some options including weight loss medication. I think she would be a candidate for Ozempic or [id number].  Because of the backorder issues with [facility] we will start with Ozempic we did warn about potential side effects.  No family history of medullary thyroid cancer.  F/U in 4 weeks to make sure tolerating well and discuss setting goals.       Relevant Orders   POCT glycosylated hemoglobin (Hb A1C) (Completed)   Encounter for weight management    Visit #: 1 Starting Weight: 221 lbs  Current weight: 221 lbs Previous weight: 230 lbs Change in weight: Goal weight: < 200 lbs Dietary goals: Continue to work on getting adequate protein intake. Exercise goals: Team to work on consistency of routine exercise Medication: Start Ozempic 0.25 mg weekly.  Then plan to increase to 0.5 mg weekly. Follow-up and referrals: 4 weeks.       Other Visit Diagnoses     Other hyperlipidemia           Meds ordered this encounter  Medications   Semaglutide,0.25 or 0.5MG /DOS, (OZEMPIC, 0.25 OR 0.5 MG/DOSE,) 2 MG/1.5ML SOPN    Sig: Inject 0.25 mg into the skin once a week.    Dispense:  1.5 mL    Refill:  0     Follow-up: Return in about 4 weeks (around 10/01/2021) for New start medication.     Beatrice Lecher, MD

## 2021-09-03 NOTE — Assessment & Plan Note (Signed)
She has really done a phenomenal job over the last 4 months and trying to eat more healthy and exercise and lose weight.  But she is getting to the point where she is getting frustrated because she cannot seem to get it to continue to move forward.  We did discuss some options including weight loss medication. I think she would be a candidate for Ozempic or [id number].  Because of the backorder issues with [facility] we will start with Ozempic we did warn about potential side effects.  No family history of medullary thyroid cancer.  F/U in 4 weeks to make sure tolerating well and discuss setting goals.

## 2021-09-03 NOTE — Assessment & Plan Note (Signed)
Visit #: 1 Starting Weight: 221 lbs  Current weight: 221 lbs Previous weight: 230 lbs Change in weight: Goal weight: < 200 lbs Dietary goals: Continue to work on getting adequate protein intake. Exercise goals: Team to work on consistency of routine exercise Medication: Start Ozempic 0.25 mg weekly.  Then plan to increase to 0.5 mg weekly. Follow-up and referrals: 4 weeks.

## 2021-10-03 ENCOUNTER — Encounter: Payer: Self-pay | Admitting: Family Medicine

## 2021-10-03 ENCOUNTER — Ambulatory Visit: Payer: 59 | Admitting: Family Medicine

## 2021-10-03 ENCOUNTER — Other Ambulatory Visit (HOSPITAL_COMMUNITY): Payer: Self-pay

## 2021-10-03 VITALS — BP 111/66 | HR 94 | Ht 65.0 in | Wt 218.0 lb

## 2021-10-03 DIAGNOSIS — Z6836 Body mass index (BMI) 36.0-36.9, adult: Secondary | ICD-10-CM

## 2021-10-03 DIAGNOSIS — Z7689 Persons encountering health services in other specified circumstances: Secondary | ICD-10-CM | POA: Diagnosis not present

## 2021-10-03 MED ORDER — OZEMPIC (0.25 OR 0.5 MG/DOSE) 2 MG/1.5ML ~~LOC~~ SOPN
0.5000 mg | PEN_INJECTOR | SUBCUTANEOUS | 0 refills | Status: DC
  Filled 2021-10-03: qty 1.5, 28d supply, fill #0

## 2021-10-03 NOTE — Assessment & Plan Note (Signed)
Visit #: 2 Starting Weight: 230 lbs  Current weight: 218 lbs Previous weight: 221 lbs Change in weight: down 3 lbs  Goal weight: < 200 lbs Dietary goals: Continue to work on getting adequate protein intake. Focus on healthy lunch options and less on the go.  Exercise goals: Team to work on consistency of routine exercise. Mostly walking and some stationary bike Medication: Continue Ozempic 0.5 mg weekly. Follow-up and referrals: 6 weeks.

## 2021-10-03 NOTE — Progress Notes (Signed)
Pt started taking .05 mg of the Ozempic last week. She does get some random GI upset,stomach pain and diarrhea.  She did also stat that she gets some irritation after the injecton.

## 2021-10-03 NOTE — Progress Notes (Signed)
Established Patient Office Visit  Subjective:  Patient ID: Lisa Cannon, female    DOB: [birth year]  Age: 40 y.o. MRN: [id number]  CC:  Chief Complaint  Patient presents with   Prediabetes    HPI Lisa Cannon presents for weight management.  For Pt started taking .05 mg of the Ozempic last week. She does get some random GI upset,stomach pain and diarrhea.  She did also stat that she gets some irritation after the injecton.  She has been trying to walk daily and does a stationary bike maybe once or twice a week.  She has had an increase in headaches especially initially and was not sure if it could be secondary to the medication or for just her chronic headaches.  But has seemed to improve recently.   Past Medical History:  Diagnosis Date   Abnormal Pap smear of cervix    Heartburn    Migraines     Past Surgical History:  Procedure Laterality Date   CERVICAL BIOPSY  W/ LOOP ELECTRODE EXCISION  2002   LAPAROSCOPIC BILATERAL SALPINGECTOMY Bilateral 09/12/2015   Procedure: LAPAROSCOPIC BILATERAL SALPINGECTOMY;  Surgeon: Emily Filbert, MD;  Location: [facility];  Service: Gynecology;  Laterality: Bilateral;   LAPAROSCOPIC LYSIS OF ADHESIONS  09/12/2015   Procedure: LAPAROSCOPIC LYSIS OF ADHESIONS;  Surgeon: Emily Filbert, MD;  Location: [facility];  Service: Gynecology;;   TUBAL LIGATION     WISDOM TOOTH EXTRACTION      Family History  Problem Relation Age of Onset   Hypertension Maternal Grandfather    Cancer Paternal Grandfather        lung cancer   Cancer Paternal Grandmother        brain   Hyperlipidemia Mother    Diabetes Maternal Aunt    Hypertension Maternal Aunt     Social History   Socioeconomic History   Marital status: Married    Spouse name: BJ   Number of children: 1   Years of education: 16   Highest education level: Bachelor's degree (e.g., BA, AB, BS)  Occupational History   Occupation: homemaker  Tobacco Use   Smoking status: Never   Smokeless tobacco:  Never  Vaping Use   Vaping Use: Never used  Substance and Sexual Activity   Alcohol use: No    Alcohol/week: 0.0 standard drinks   Drug use: No   Sexual activity: Yes    Partners: Male    Birth control/protection: Surgical  Other Topics Concern   Not on file  Social History Narrative   Lives with husband BJ and daughter [name]    Caffeine use: none   Right handed    Social Determinants of Health   Financial Resource Strain: Not on file  Food Insecurity: Not on file  Transportation Needs: Not on file  Physical Activity: Not on file  Stress: Not on file  Social Connections: Not on file  Intimate Partner Violence: Not on file    Outpatient Medications Prior to Visit  Medication Sig Dispense Refill   Atogepant (QULIPTA) 30 MG TABS Take 1 tablet by mouth daily. 90 tablet 4   cyclobenzaprine (FLEXERIL) 5 MG tablet TAKE 1 TABLET BY MOUTH ONCE DAILY AS NEEDED FOR NECK PAIN 90 tablet 0   fluticasone (FLONASE) 50 MCG/ACT nasal spray Place into both nostrils as needed for allergies or rhinitis.     Multiple Vitamin (MULTIVITAMIN) capsule Take 1 capsule by mouth daily.     nadolol (CORGARD) 20 MG tablet TAKE  3 TABLETS BY MOUTH DAILY 270 tablet 3   naproxen sodium (ALEVE) 220 MG tablet Take 220 mg by mouth daily as needed (for pain).     Probiotic Product (PROBIOTIC PO) Take by mouth.     SUMAtriptan (IMITREX) 100 MG tablet TAKE 1 TABLET BY MOUTH AT ONSET OF HEADACHE. MAY REPEAT DOSE AFTER 2 HOURS. DO NOT TAKE MORE THAN 2 DAYS A WEEK. 30 tablet 3   Semaglutide,0.25 or 0.5MG /DOS, (OZEMPIC, 0.25 OR 0.5 MG/DOSE,) 2 MG/1.5ML SOPN Inject 0.25 mg into the skin once a week. 1.5 mL 0   No facility-administered medications prior to visit.    No Known Allergies  ROS Review of Systems    Objective:    Physical Exam Constitutional:      Appearance: Normal appearance. She is well-developed.  HENT:     Head: Normocephalic and atraumatic.  Cardiovascular:     Rate and Rhythm: Normal rate  and regular rhythm.     Heart sounds: Normal heart sounds.  Pulmonary:     Effort: Pulmonary effort is normal.     Breath sounds: Normal breath sounds.  Skin:    General: Skin is warm and dry.  Neurological:     Mental Status: She is alert and oriented to person, place, and time.  Psychiatric:        Behavior: Behavior normal.    BP 111/66   Pulse 94   Ht 5\' 5"  (1.651 m)   Wt 218 lb (98.9 kg)   SpO2 100%   BMI 36.28 kg/m  Wt Readings from Last 3 Encounters:  10/03/21 218 lb (98.9 kg)  09/03/21 221 lb (100.2 kg)  08/20/21 221 lb (100.2 kg)     There are no preventive care reminders to display for this patient.  There are no preventive care reminders to display for this patient.  Lab Results  Component Value Date   TSH 1.45 05/09/2020   Lab Results  Component Value Date   WBC 6.6 05/03/2021   HGB 15.6 (H) 05/03/2021   HCT 46.3 (H) 05/03/2021   MCV 87.2 05/03/2021   PLT 278 05/03/2021   Lab Results  Component Value Date   NA 139 05/03/2021   K 4.6 05/03/2021   CO2 29 05/03/2021   GLUCOSE 102 (H) 05/03/2021   BUN 10 05/03/2021   CREATININE 0.86 05/03/2021   BILITOT 0.6 05/03/2021   ALKPHOS 91 05/29/2020   AST 19 05/03/2021   ALT 28 05/03/2021   PROT 7.8 05/03/2021   ALBUMIN 4.7 05/29/2020   CALCIUM 10.1 05/03/2021   Lab Results  Component Value Date   CHOL 212 (H) 05/03/2021   Lab Results  Component Value Date   HDL 43 (L) 05/03/2021   Lab Results  Component Value Date   LDLCALC 142 (H) 05/03/2021   Lab Results  Component Value Date   TRIG 144 05/03/2021   Lab Results  Component Value Date   CHOLHDL 4.9 05/03/2021   Lab Results  Component Value Date   HGBA1C 5.8 (A) 09/03/2021      Assessment & Plan:   Problem List Items Addressed This Visit       Other   Encounter for weight management - Primary    Visit #: 2 Starting Weight: 230 lbs   Current weight: 218 lbs Previous weight: 221 lbs Change in weight: down 3 lbs  Goal  weight: < 200 lbs Dietary goals: Continue to work on getting adequate protein intake. Focus on healthy lunch options and  less on the go.  Exercise goals: Team to work on consistency of routine exercise. Mostly walking and some stationary bike Medication: Continue Ozempic 0.5 mg weekly. Follow-up and referrals: 6 weeks.      Other Visit Diagnoses     Class 2 severe obesity due to excess calories with serious comorbidity and body mass index (BMI) of 36.0 to 36.9 in adult [facility])       Relevant Medications   Semaglutide,0.25 or 0.5MG /DOS, (OZEMPIC, 0.25 OR 0.5 MG/DOSE,) 2 MG/1.5ML SOPN       Meds ordered this encounter  Medications   Semaglutide,0.25 or 0.5MG /DOS, (OZEMPIC, 0.25 OR 0.5 MG/DOSE,) 2 MG/1.5ML SOPN    Sig: Inject 0.5 mg into the skin once a week.    Dispense:  1.5 mL    Refill:  0    Follow-up: Return in about 6 weeks (around 11/14/2021) for Weight Management .    Beatrice Lecher, MD

## 2021-10-03 NOTE — Patient Instructions (Signed)
Please send me a note in about 4 weeks to let me know if you want to stay at the 0.5 or if you are ready to go up to the 1 mg pen.

## 2021-10-11 ENCOUNTER — Other Ambulatory Visit: Payer: Self-pay | Admitting: Neurology

## 2021-10-11 DIAGNOSIS — M542 Cervicalgia: Secondary | ICD-10-CM

## 2021-10-12 ENCOUNTER — Other Ambulatory Visit (HOSPITAL_COMMUNITY): Payer: Self-pay

## 2021-10-13 ENCOUNTER — Other Ambulatory Visit: Payer: Self-pay | Admitting: Neurology

## 2021-10-13 ENCOUNTER — Other Ambulatory Visit (HOSPITAL_COMMUNITY): Payer: Self-pay

## 2021-10-13 DIAGNOSIS — M542 Cervicalgia: Secondary | ICD-10-CM

## 2021-10-15 ENCOUNTER — Other Ambulatory Visit (HOSPITAL_COMMUNITY): Payer: Self-pay

## 2021-10-15 MED ORDER — CYCLOBENZAPRINE HCL 5 MG PO TABS
5.0000 mg | ORAL_TABLET | Freq: Every day | ORAL | 0 refills | Status: DC | PRN
  Filled 2021-10-15: qty 90, 90d supply, fill #0

## 2021-10-24 ENCOUNTER — Other Ambulatory Visit (HOSPITAL_COMMUNITY): Payer: Self-pay

## 2021-10-26 ENCOUNTER — Other Ambulatory Visit (HOSPITAL_COMMUNITY): Payer: Self-pay

## 2021-10-31 ENCOUNTER — Encounter: Payer: Self-pay | Admitting: Family Medicine

## 2021-10-31 ENCOUNTER — Other Ambulatory Visit (HOSPITAL_COMMUNITY): Payer: Self-pay

## 2021-10-31 MED ORDER — OZEMPIC (1 MG/DOSE) 4 MG/3ML ~~LOC~~ SOPN
1.0000 mg | PEN_INJECTOR | SUBCUTANEOUS | 0 refills | Status: DC
  Filled 2021-10-31: qty 3, 28d supply, fill #0

## 2021-10-31 NOTE — Telephone Encounter (Signed)
Meds ordered this encounter  Medications   Semaglutide, 1 MG/DOSE, (OZEMPIC, 1 MG/DOSE,) 4 MG/3ML SOPN    Sig: Inject 1 mg into the skin once a week.    Dispense:  3 mL    Refill:  0

## 2021-11-14 ENCOUNTER — Ambulatory Visit: Payer: 59 | Admitting: Family Medicine

## 2021-11-14 ENCOUNTER — Other Ambulatory Visit: Payer: Self-pay

## 2021-11-14 ENCOUNTER — Other Ambulatory Visit (HOSPITAL_COMMUNITY): Payer: Self-pay

## 2021-11-14 ENCOUNTER — Encounter: Payer: Self-pay | Admitting: Family Medicine

## 2021-11-14 VITALS — BP 107/70 | HR 83 | Temp 98.2°F | Resp 18 | Ht 66.0 in | Wt 215.0 lb

## 2021-11-14 DIAGNOSIS — Z713 Dietary counseling and surveillance: Secondary | ICD-10-CM | POA: Diagnosis not present

## 2021-11-14 DIAGNOSIS — Z7689 Persons encountering health services in other specified circumstances: Secondary | ICD-10-CM

## 2021-11-14 MED ORDER — OZEMPIC (1 MG/DOSE) 4 MG/3ML ~~LOC~~ SOPN
1.0000 mg | PEN_INJECTOR | SUBCUTANEOUS | 0 refills | Status: DC
  Filled 2021-11-14: qty 3, 28d supply, fill #0

## 2021-11-14 NOTE — Progress Notes (Signed)
Established Patient Office Visit  Subjective:  Patient ID: Lisa Cannon, female    DOB: 1981-09-14  Age: 40 y.o. MRN: [id number]  CC:  Chief Complaint  Patient presents with   Weight Management     Follow up for Ozempic. Ozempic is working well for patient.     HPI Lisa Cannon presents for Weight Management.  Is doing well overall just increased her Ozempic to 1 mg about a week and a half ago so she just had her second shot yesterday.  Initially she did have some significant nausea but she has not had any nausea after giving her shot yesterday so far.  She has noticed that the stools have been a little bit more firm so she has been adding in some fiber and that is been helpful she has noticed it really has been helping with helping her control portions and eating smaller amounts.  She is also cut back on eating out at lunch when she is out and about and on the go she is limited that to once a week.  She has been riding the bike 2 days/week usually for about 20 minutes sometimes up to 30.  And [place].  She has noticed an increase in hair loss and shedding it is been generalized.  She is not sure if it could be a medication side effect.  She also noticed that her period was about a week late which was a little bit unusual for her.  Past Medical History:  Diagnosis Date   Abnormal Pap smear of cervix    Heartburn    Migraines     Past Surgical History:  Procedure Laterality Date   CERVICAL BIOPSY  W/ LOOP ELECTRODE EXCISION  2002   LAPAROSCOPIC BILATERAL SALPINGECTOMY Bilateral 09/12/2015   Procedure: LAPAROSCOPIC BILATERAL SALPINGECTOMY;  Surgeon: Emily Filbert, MD;  Location: [facility];  Service: Gynecology;  Laterality: Bilateral;   LAPAROSCOPIC LYSIS OF ADHESIONS  09/12/2015   Procedure: LAPAROSCOPIC LYSIS OF ADHESIONS;  Surgeon: Emily Filbert, MD;  Location: [facility];  Service: Gynecology;;   TUBAL LIGATION     WISDOM TOOTH EXTRACTION       Family History  Problem Relation Age of Onset   Hypertension Maternal Grandfather    Cancer Paternal Grandfather        lung cancer   Cancer Paternal Grandmother        brain   Hyperlipidemia Mother    Diabetes Maternal Aunt    Hypertension Maternal Aunt     Social History   Socioeconomic History   Marital status: Married    Spouse name: BJ   Number of children: 1   Years of education: 16   Highest education level: Bachelor's degree (e.g., BA, AB, BS)  Occupational History   Occupation: homemaker  Tobacco Use   Smoking status: Never   Smokeless tobacco: Never  Vaping Use   Vaping Use: Never used  Substance and Sexual Activity   Alcohol use: No    Alcohol/week: 0.0 standard drinks   Drug use: No   Sexual activity: Yes    Partners: Male    Birth control/protection: Surgical  Other Topics Concern   Not on file  Social History Narrative   Lives with husband BJ and daughter Lisa Cannon    Caffeine use: none   Right handed    Social Determinants of Health   Financial Resource Strain: Not on file  Food Insecurity:  Not on file  Transportation Needs: Not on file  Physical Activity: Not on file  Stress: Not on file  Social Connections: Not on file  Intimate Partner Violence: Not on file    Outpatient Medications Prior to Visit  Medication Sig Dispense Refill   Atogepant (QULIPTA) 30 MG TABS Take 1 tablet by mouth daily. 90 tablet 4   cyclobenzaprine (FLEXERIL) 5 MG tablet Take 1 tablet (5 mg total) by mouth daily as needed for muscle spasms (neck pain). 90 tablet 0   fluticasone (FLONASE) 50 MCG/ACT nasal spray Place into both nostrils as needed for allergies or rhinitis.     Multiple Vitamin (MULTIVITAMIN) capsule Take 1 capsule by mouth daily.     nadolol (CORGARD) 20 MG tablet TAKE 3 TABLETS BY MOUTH DAILY 270 tablet 3   naproxen sodium (ALEVE) 220 MG tablet Take 220 mg by mouth daily as needed (for pain).     Probiotic Product (PROBIOTIC PO) Take by mouth.      psyllium (REGULOID) 0.52 g capsule Take 0.52 g by mouth daily.     SUMAtriptan (IMITREX) 100 MG tablet TAKE 1 TABLET BY MOUTH AT ONSET OF HEADACHE. MAY REPEAT DOSE AFTER 2 HOURS. DO NOT TAKE MORE THAN 2 DAYS A WEEK. 30 tablet 3   Semaglutide, 1 MG/DOSE, (OZEMPIC, 1 MG/DOSE,) 4 MG/3ML SOPN Inject 1 mg into the skin once a week. 3 mL 0   No facility-administered medications prior to visit.    No Known Allergies  ROS Review of Systems    Objective:    Physical Exam Constitutional:      Appearance: Normal appearance. She is well-developed.  HENT:     Head: Normocephalic and atraumatic.  Cardiovascular:     Rate and Rhythm: Normal rate and regular rhythm.     Heart sounds: Normal heart sounds.  Pulmonary:     Effort: Pulmonary effort is normal.     Breath sounds: Normal breath sounds.  Skin:    General: Skin is warm and dry.  Neurological:     Mental Status: She is alert and oriented to person, place, and time.  Psychiatric:        Behavior: Behavior normal.    BP 107/70    Pulse 83    Temp 98.2 F (36.8 C)    Resp 18    Ht 5\' 6"  (1.676 m)    Wt 215 lb (97.5 kg)    LMP 10/31/2021    SpO2 100%    BMI 34.70 kg/m  Wt Readings from Last 3 Encounters:  11/14/21 215 lb (97.5 kg)  10/03/21 218 lb (98.9 kg)  09/03/21 221 lb (100.2 kg)     There are no preventive care reminders to display for this patient.  There are no preventive care reminders to display for this patient.  Lab Results  Component Value Date   TSH 1.45 05/09/2020   Lab Results  Component Value Date   WBC 6.6 05/03/2021   HGB 15.6 (H) 05/03/2021   HCT 46.3 (H) 05/03/2021   MCV 87.2 05/03/2021   PLT 278 05/03/2021   Lab Results  Component Value Date   NA 139 05/03/2021   K 4.6 05/03/2021   CO2 29 05/03/2021   GLUCOSE 102 (H) 05/03/2021   BUN 10 05/03/2021   CREATININE 0.86 05/03/2021   BILITOT 0.6 05/03/2021   ALKPHOS 91 05/29/2020   AST 19 05/03/2021   ALT 28 05/03/2021   PROT 7.8 05/03/2021    ALBUMIN 4.7 05/29/2020  CALCIUM 10.1 05/03/2021   Lab Results  Component Value Date   CHOL 212 (H) 05/03/2021   Lab Results  Component Value Date   HDL 43 (L) 05/03/2021   Lab Results  Component Value Date   LDLCALC 142 (H) 05/03/2021   Lab Results  Component Value Date   TRIG 144 05/03/2021   Lab Results  Component Value Date   CHOLHDL 4.9 05/03/2021   Lab Results  Component Value Date   HGBA1C 5.8 (A) 09/03/2021      Assessment & Plan:   Problem List Items Addressed This Visit       Other   Encounter for weight management - Primary    Visit #: 3 Starting Weight: 230 lbs   Current weight: 215 lbs Previous weight: 218 lbs Change in weight: down 3 lbs  Goal weight: < 200 lbs Dietary goals: Continue to work on getting adequate protein intake. Eating out about once a week, work on choices at [employer].  Exercise goals: Work on consistency of routine exercise. Mostly walkin. stationary bike 2 days per week. Plans to add about 5 min to her work out.   Medication: Continue Ozempic 1 mg weekly. Follow-up and referrals: 6 weeks.         Meds ordered this encounter  Medications   Semaglutide, 1 MG/DOSE, (OZEMPIC, 1 MG/DOSE,) 4 MG/3ML SOPN    Sig: Inject 1 mg into the skin once a week.    Dispense:  3 mL    Refill:  0     Follow-up: Return in about 6 weeks (around 12/26/2021) for weight management .    Beatrice Lecher, MD

## 2021-11-14 NOTE — Assessment & Plan Note (Addendum)
Visit #:3 Starting Weight:230 lbs  Current weight:215 lbs Previous weight:218 lbs Change in weight: down 3 lbs  Goal weight:< 200 lbs Dietary goals:Continue to work on getting adequate protein intake. Eating out about once a week, work on choices at [employer].  Exercise goals:Work on consistency of routine exercise. Mostly walkin. stationary bike 2 days per week. Plans to add about 5 min to her work out.   Medication:Continue Ozempic 1 mg weekly. Follow-up and referrals:6 weeks.

## 2021-11-19 ENCOUNTER — Other Ambulatory Visit (HOSPITAL_COMMUNITY): Payer: Self-pay

## 2021-11-22 ENCOUNTER — Other Ambulatory Visit (HOSPITAL_COMMUNITY): Payer: Self-pay

## 2021-12-14 ENCOUNTER — Other Ambulatory Visit (HOSPITAL_COMMUNITY): Payer: Self-pay

## 2021-12-18 ENCOUNTER — Other Ambulatory Visit (HOSPITAL_COMMUNITY): Payer: Self-pay

## 2021-12-26 ENCOUNTER — Ambulatory Visit: Payer: 59 | Admitting: Family Medicine

## 2021-12-26 ENCOUNTER — Other Ambulatory Visit: Payer: Self-pay

## 2021-12-26 ENCOUNTER — Other Ambulatory Visit (HOSPITAL_COMMUNITY): Payer: Self-pay

## 2021-12-26 ENCOUNTER — Encounter: Payer: Self-pay | Admitting: Family Medicine

## 2021-12-26 VITALS — BP 110/74 | HR 92 | Resp 16 | Ht 66.0 in | Wt 213.0 lb

## 2021-12-26 DIAGNOSIS — E559 Vitamin D deficiency, unspecified: Secondary | ICD-10-CM | POA: Diagnosis not present

## 2021-12-26 DIAGNOSIS — L659 Nonscarring hair loss, unspecified: Secondary | ICD-10-CM | POA: Diagnosis not present

## 2021-12-26 DIAGNOSIS — R7303 Prediabetes: Secondary | ICD-10-CM | POA: Diagnosis not present

## 2021-12-26 DIAGNOSIS — Z7689 Persons encountering health services in other specified circumstances: Secondary | ICD-10-CM | POA: Diagnosis not present

## 2021-12-26 MED ORDER — SEMAGLUTIDE (2 MG/DOSE) 8 MG/3ML ~~LOC~~ SOPN
2.0000 mg | PEN_INJECTOR | SUBCUTANEOUS | 1 refills | Status: DC
  Filled 2021-12-26: qty 3, 30d supply, fill #0
  Filled 2022-01-22 (×2): qty 3, 30d supply, fill #1

## 2021-12-26 NOTE — Assessment & Plan Note (Signed)
Visit #:4 Starting Weight:230lbs  Current weight:213lbs Previous weight:215lbs Change in weight:down 2 lbs Goal weight:< 200 lbs Dietary goals:Continue to work on getting adequate protein intake. Eating out about once a week, work on choices at [employer].  Exercise goals:Work on consistency of routine exercise. Mostly walkin. stationary bike 2 days per week. Plans to add about 5 min to her work out.   Medication:Inc to Ozempic 2 mg weekly. Follow-up and referrals:6weeks.

## 2021-12-26 NOTE — Progress Notes (Signed)
Established Patient Office Visit  Subjective:  Patient ID: Lisa Cannon, female    DOB: [birth year]  Age: 41 y.o. MRN: [id number]  CC:  Chief Complaint  Patient presents with   weight management    Patient currently on Ozempic and it is helping with portion control. Patient stated she has noticed a slight increase in hunger. Previous weight was 215. Today patient is 16. Patient states she is still having some hair loss but would like to stay on Ozempic.     HPI Lisa Cannon presents for Patient currently on Ozempic and it is helping with portion control. Patient stated she has noticed a slight increase in hunger.  She was also under little bit more stress over this last month.  Her husband gets sick he has to quarantine because of some health issues and so that pretty much makes her single parent temporarily..  She just noticed that it was a little bit harder to portion control and feel more full.  She is still riding the bike twice a week and still walking daily for exercise.  Previous weight was 215. Today patient is 55. Patient states she is still having some hair loss but would like to stay on Ozempic.  Currently on the 1 mg dose.  Continuing to notice hair loss.  She says it still just a little bit all over.  It started around the time that she started the Ozempic.  It does not seem to be slowing down or stopping no so she really wonders if it could be a medication side effect or if it something else going on.  Is always had slightly elevated testosterone levels.  Past Medical History:  Diagnosis Date   Abnormal Pap smear of cervix    Heartburn    Migraines     Past Surgical History:  Procedure Laterality Date   CERVICAL BIOPSY  W/ LOOP ELECTRODE EXCISION  2002   LAPAROSCOPIC BILATERAL SALPINGECTOMY Bilateral 09/12/2015   Procedure: LAPAROSCOPIC BILATERAL SALPINGECTOMY;  Surgeon: Emily Filbert, MD;  Location: [facility];  Service: Gynecology;  Laterality: Bilateral;   LAPAROSCOPIC  LYSIS OF ADHESIONS  09/12/2015   Procedure: LAPAROSCOPIC LYSIS OF ADHESIONS;  Surgeon: Emily Filbert, MD;  Location: [facility];  Service: Gynecology;;   TUBAL LIGATION     WISDOM TOOTH EXTRACTION      Family History  Problem Relation Age of Onset   Hypertension Maternal Grandfather    Cancer Paternal Grandfather        lung cancer   Cancer Paternal Grandmother        brain   Hyperlipidemia Mother    Diabetes Maternal Aunt    Hypertension Maternal Aunt     Social History   Socioeconomic History   Marital status: Married    Spouse name: BJ   Number of children: 1   Years of education: 16   Highest education level: Bachelor's degree (e.g., BA, AB, BS)  Occupational History   Occupation: homemaker  Tobacco Use   Smoking status: Never   Smokeless tobacco: Never  Vaping Use   Vaping Use: Never used  Substance and Sexual Activity   Alcohol use: No    Alcohol/week: 0.0 standard drinks   Drug use: No   Sexual activity: Yes    Partners: Male    Birth control/protection: Surgical  Other Topics Concern   Not on file  Social History Narrative   Lives with husband BJ and daughter [name]    Caffeine use:  none   Right handed    Social Determinants of Health   Financial Resource Strain: Not on file  Food Insecurity: Not on file  Transportation Needs: Not on file  Physical Activity: Not on file  Stress: Not on file  Social Connections: Not on file  Intimate Partner Violence: Not on file    Outpatient Medications Prior to Visit  Medication Sig Dispense Refill   Atogepant (QULIPTA) 30 MG TABS Take 1 tablet by mouth daily. 90 tablet 4   cyclobenzaprine (FLEXERIL) 5 MG tablet Take 1 tablet (5 mg total) by mouth daily as needed for muscle spasms (neck pain). 90 tablet 0   fluticasone (FLONASE) 50 MCG/ACT nasal spray Place into both nostrils as needed for allergies or rhinitis.     Multiple Vitamin (MULTIVITAMIN) capsule Take 1 capsule by mouth daily.     nadolol (CORGARD) 20 MG  tablet TAKE 3 TABLETS BY MOUTH DAILY 270 tablet 3   naproxen sodium (ALEVE) 220 MG tablet Take 220 mg by mouth daily as needed (for pain).     Probiotic Product (PROBIOTIC PO) Take by mouth.     psyllium (REGULOID) 0.52 g capsule Take 0.52 g by mouth daily.     SUMAtriptan (IMITREX) 100 MG tablet TAKE 1 TABLET BY MOUTH AT ONSET OF HEADACHE. MAY REPEAT DOSE AFTER 2 HOURS. DO NOT TAKE MORE THAN 2 DAYS A WEEK. 30 tablet 3   Semaglutide, 1 MG/DOSE, (OZEMPIC, 1 MG/DOSE,) 4 MG/3ML SOPN Inject 1 mg into the skin once a week. 3 mL 0   No facility-administered medications prior to visit.    No Known Allergies  ROS Review of Systems    Objective:    Physical Exam Constitutional:      Appearance: Normal appearance. She is well-developed.  HENT:     Head: Normocephalic and atraumatic.  Cardiovascular:     Rate and Rhythm: Normal rate and regular rhythm.     Heart sounds: Normal heart sounds.  Pulmonary:     Effort: Pulmonary effort is normal.     Breath sounds: Normal breath sounds.  Skin:    General: Skin is warm and dry.  Neurological:     Mental Status: She is alert and oriented to person, place, and time.  Psychiatric:        Behavior: Behavior normal.    BP 110/74    Pulse 92    Resp 16    Ht 5\' 6"  (1.676 m)    Wt 213 lb (96.6 kg)    LMP 12/17/2021    SpO2 99%    BMI 34.38 kg/m  Wt Readings from Last 3 Encounters:  12/26/21 213 lb (96.6 kg)  11/14/21 215 lb (97.5 kg)  10/03/21 218 lb (98.9 kg)     There are no preventive care reminders to display for this patient.  There are no preventive care reminders to display for this patient.  Lab Results  Component Value Date   TSH 1.45 05/09/2020   Lab Results  Component Value Date   WBC 6.6 05/03/2021   HGB 15.6 (H) 05/03/2021   HCT 46.3 (H) 05/03/2021   MCV 87.2 05/03/2021   PLT 278 05/03/2021   Lab Results  Component Value Date   NA 139 05/03/2021   K 4.6 05/03/2021   CO2 29 05/03/2021   GLUCOSE 102 (H)  05/03/2021   BUN 10 05/03/2021   CREATININE 0.86 05/03/2021   BILITOT 0.6 05/03/2021   ALKPHOS 91 05/29/2020   AST 19 05/03/2021  ALT 28 05/03/2021   PROT 7.8 05/03/2021   ALBUMIN 4.7 05/29/2020   CALCIUM 10.1 05/03/2021   Lab Results  Component Value Date   CHOL 212 (H) 05/03/2021   Lab Results  Component Value Date   HDL 43 (L) 05/03/2021   Lab Results  Component Value Date   LDLCALC 142 (H) 05/03/2021   Lab Results  Component Value Date   TRIG 144 05/03/2021   Lab Results  Component Value Date   CHOLHDL 4.9 05/03/2021   Lab Results  Component Value Date   HGBA1C 5.8 (A) 09/03/2021      Assessment & Plan:   Problem List Items Addressed This Visit       Other   Prediabetes   Relevant Orders   Vitamin B1   B12   Fe+TIBC+Fer   TSH   Magnesium   Zinc   VITAMIN D 25 Hydroxy (Vit-D Deficiency, Fractures)   Hair loss    We will check for deficiencies that could be worsening hair loss.  She reports she does take a B12 supplement.  [place] does indicate that about 3% of patients did experience hair loss.      Relevant Orders   Vitamin B1   B12   Fe+TIBC+Fer   TSH   Magnesium   Zinc   VITAMIN D 25 Hydroxy (Vit-D Deficiency, Fractures)   Encounter for weight management - Primary    Visit #: 4 Starting Weight: 230 lbs   Current weight: 213 lbs Previous weight: 215 lbs Change in weight: down 2 lbs  Goal weight: < 200 lbs Dietary goals: Continue to work on getting adequate protein intake. Eating out about once a week, work on choices at [employer].  Exercise goals: Work on consistency of routine exercise. Mostly walkin. stationary bike 2 days per week. Plans to add about 5 min to her work out.   Medication:Inc to Ozempic 2 mg weekly. Follow-up and referrals: 6 weeks.      Relevant Medications   Semaglutide, 2 MG/DOSE, 8 MG/3ML SOPN   Other Relevant Orders   Vitamin B1   B12   Fe+TIBC+Fer   TSH   Magnesium   Zinc   VITAMIN D 25 Hydroxy (Vit-D  Deficiency, Fractures)   Other Visit Diagnoses     Vitamin D deficiency       Relevant Orders   Vitamin B1   B12   Fe+TIBC+Fer   TSH   Magnesium   Zinc   VITAMIN D 25 Hydroxy (Vit-D Deficiency, Fractures)       Meds ordered this encounter  Medications   Semaglutide, 2 MG/DOSE, 8 MG/3ML SOPN    Sig: Inject 2 mg as directed once a week.    Dispense:  3 mL    Refill:  1    Follow-up: Return in about 6 weeks (around 02/06/2022) for [facility] management .    Beatrice Lecher, MD

## 2021-12-26 NOTE — Assessment & Plan Note (Addendum)
We will check for deficiencies that could be worsening hair loss.  She reports she does take a B12 supplement.  [place] does indicate that about 3% of patients did experience hair loss.

## 2022-01-08 ENCOUNTER — Other Ambulatory Visit: Payer: Self-pay | Admitting: Neurology

## 2022-01-08 DIAGNOSIS — M542 Cervicalgia: Secondary | ICD-10-CM

## 2022-01-09 ENCOUNTER — Other Ambulatory Visit (HOSPITAL_COMMUNITY): Payer: Self-pay

## 2022-01-09 MED ORDER — CYCLOBENZAPRINE HCL 5 MG PO TABS
5.0000 mg | ORAL_TABLET | Freq: Every day | ORAL | 2 refills | Status: DC | PRN
  Filled 2022-01-09: qty 90, 90d supply, fill #0
  Filled 2022-04-10: qty 90, 90d supply, fill #1
  Filled 2022-07-10: qty 90, 90d supply, fill #2

## 2022-01-22 ENCOUNTER — Other Ambulatory Visit (HOSPITAL_COMMUNITY): Payer: Self-pay

## 2022-01-23 ENCOUNTER — Other Ambulatory Visit (HOSPITAL_COMMUNITY): Payer: Self-pay

## 2022-01-27 IMAGING — MG DIGITAL DIAGNOSTIC BILAT W/ TOMO W/ CAD
8 series · 8 of 24 positions shown · non-contrast
Comparison: None.

CLINICAL DATA: 39-year-old female with intermittent fullness of the
right axilla for approximately 1 year.

EXAM:
DIGITAL DIAGNOSTIC BILATERAL MAMMOGRAM WITH CAD AND TOMO
ULTRASOUND RIGHT AXILLA

[L MLO synth-2D]
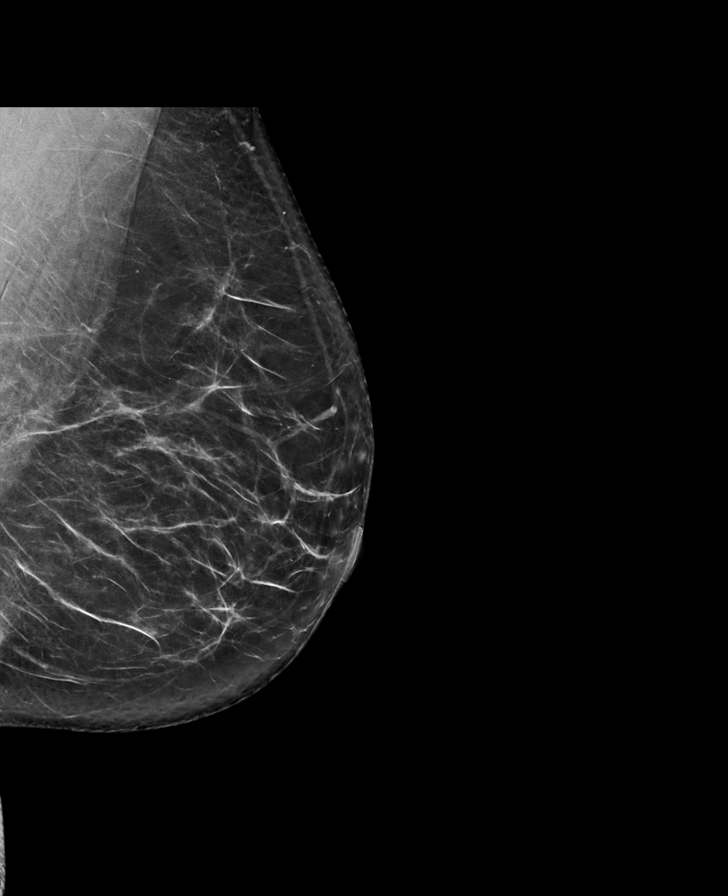

[R CC synth-2D]
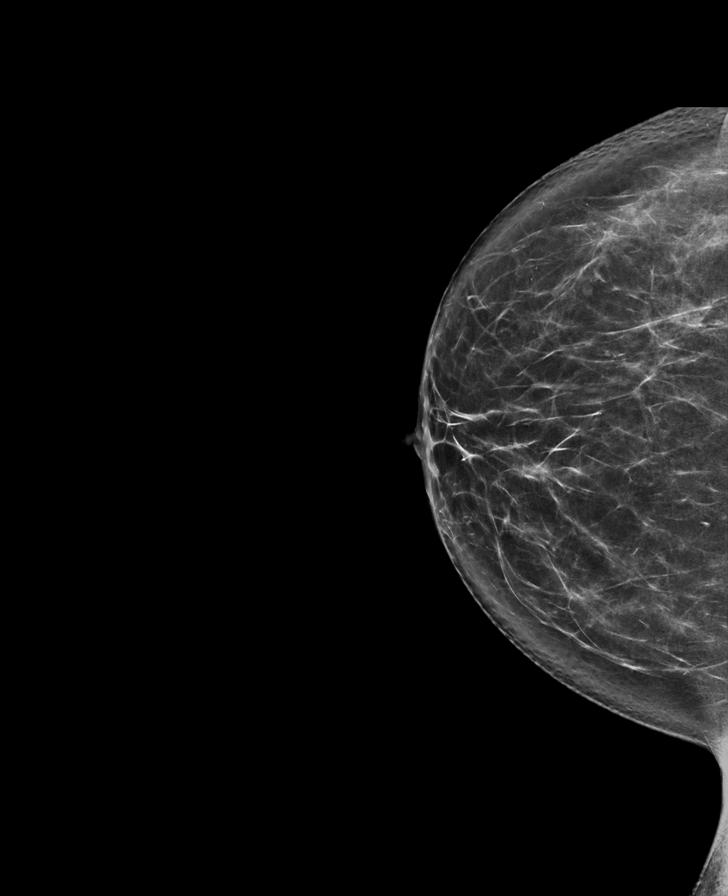

[R MLO synth-2D]
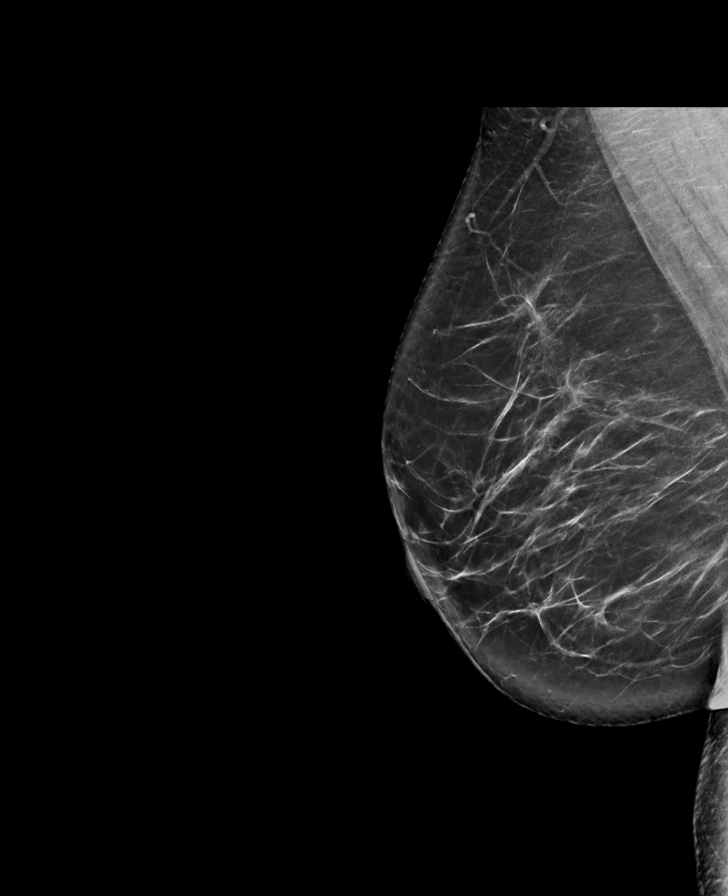

[L CC synth-2D]
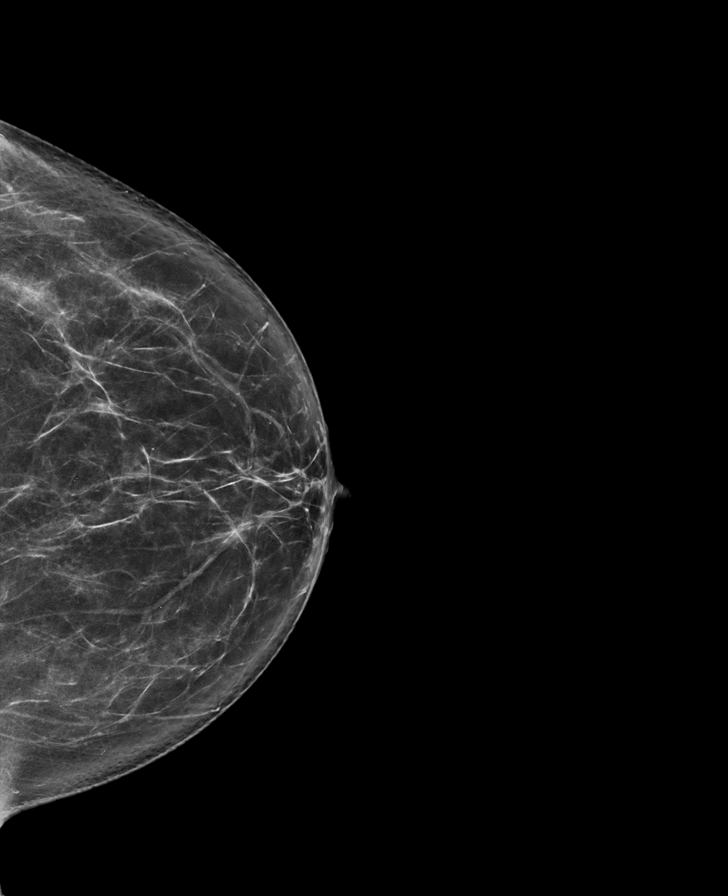

[L CC tomo · tomo slice 41/81.0]
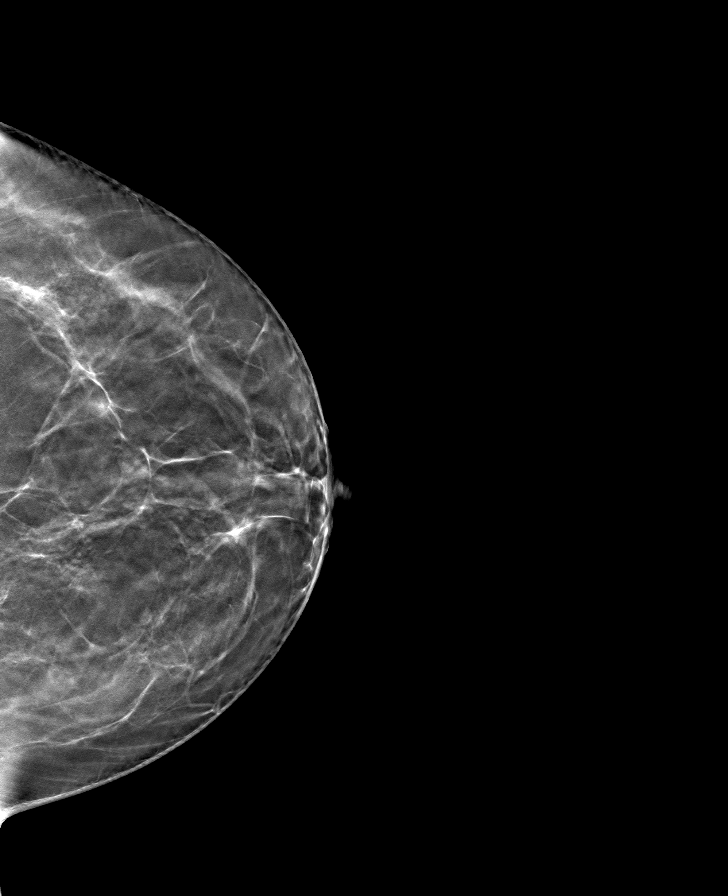

[L MLO tomo · tomo slice 43/85.0]
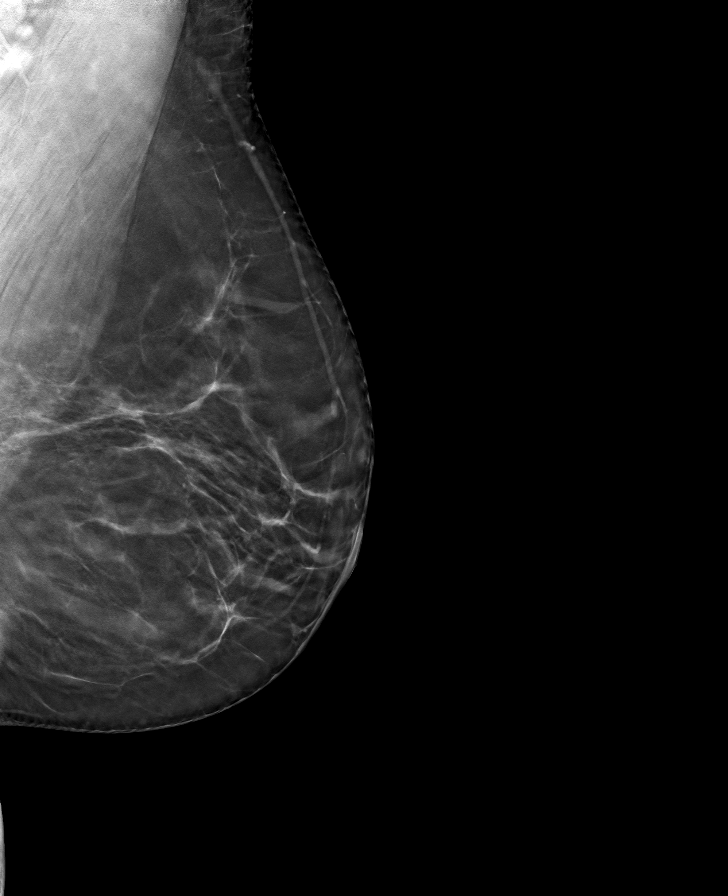

[R MLO tomo · tomo slice 46/91.0]
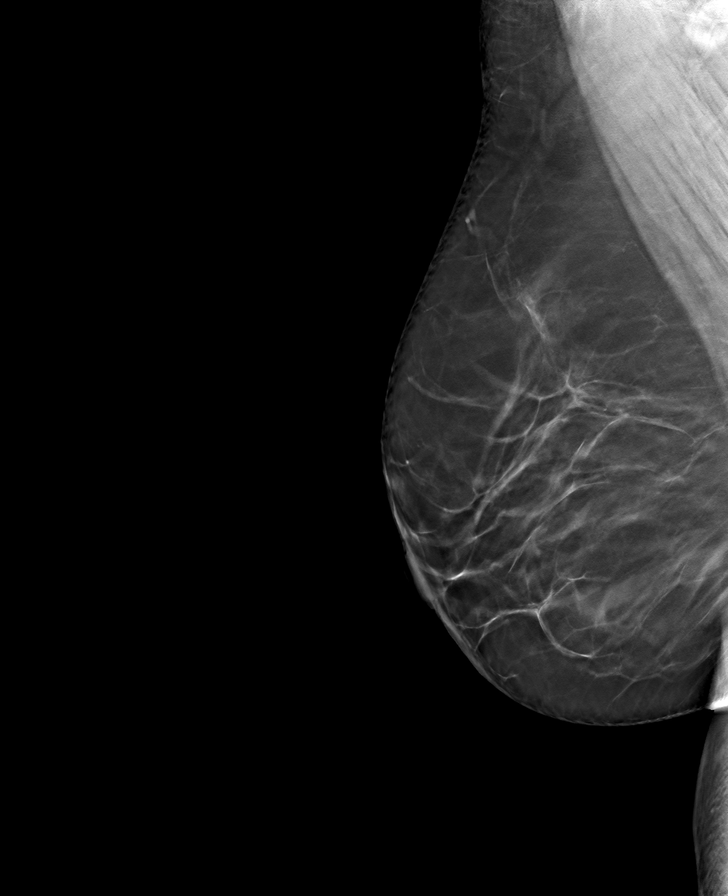

[R CC tomo · tomo slice 41/80.0]
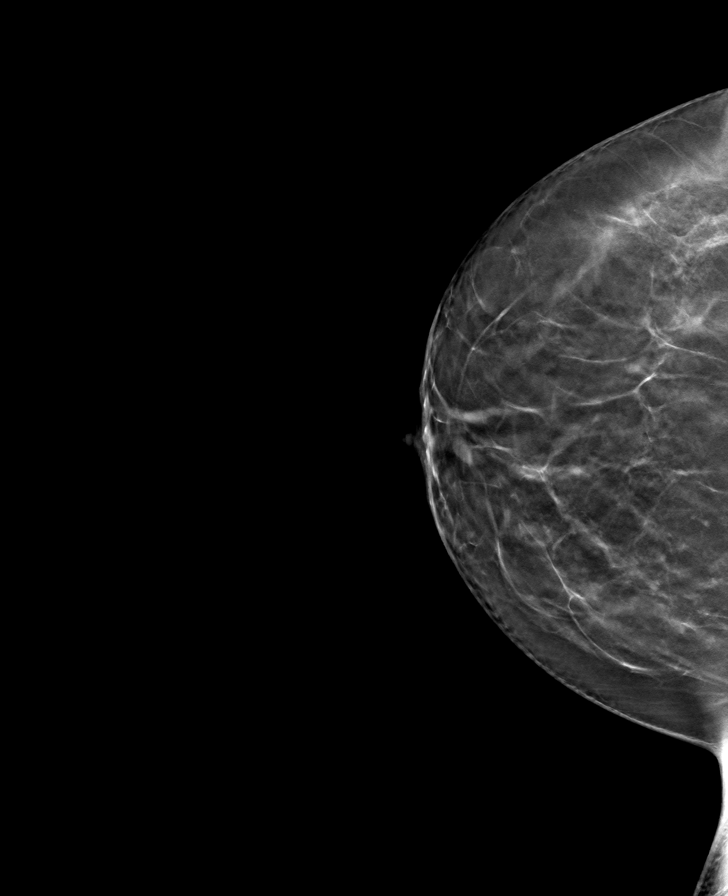

[8 of 24 positions shown; findings below may reference images not displayed]

ACR Breast Density Category b: There are scattered areas of
fibroglandular density.
FINDINGS: No suspicious mammographic findings are identified in either breast.

Mammographic images were processed with CAD.

Targeted ultrasound is performed, showing normal soft tissue without
focal or suspicious abnormality in the right axilla. A few
morphologically normal lymph nodes are identified.
IMPRESSION: 1. No mammographic evidence of malignancy in either breast.
2. No suspicious sonographic findings in the right axilla.

RECOMMENDATION:
1. Clinical follow-up recommended for the symptomatic area of
concern in the right axilla. Any further workup should be based on
clinical grounds.
2. Screening mammogram at age 41 unless there are persistent or
intervening clinical concerns. (Code:TO-I-4R5)

I have discussed the findings and recommendations with the patient.
If applicable, a reminder letter will be sent to the patient
regarding the next appointment.

BI-RADS CATEGORY  1: Negative.

## 2022-02-06 ENCOUNTER — Ambulatory Visit: Payer: 59 | Admitting: Family Medicine

## 2022-02-06 ENCOUNTER — Other Ambulatory Visit: Payer: Self-pay

## 2022-02-06 ENCOUNTER — Other Ambulatory Visit (HOSPITAL_COMMUNITY): Payer: Self-pay

## 2022-02-06 ENCOUNTER — Encounter: Payer: Self-pay | Admitting: Family Medicine

## 2022-02-06 VITALS — BP 109/66 | HR 87 | Resp 18 | Ht 66.0 in | Wt 208.0 lb

## 2022-02-06 DIAGNOSIS — Z7689 Persons encountering health services in other specified circumstances: Secondary | ICD-10-CM | POA: Diagnosis not present

## 2022-02-06 DIAGNOSIS — Z0189 Encounter for other specified special examinations: Secondary | ICD-10-CM | POA: Diagnosis not present

## 2022-02-06 MED ORDER — SEMAGLUTIDE (2 MG/DOSE) 8 MG/3ML ~~LOC~~ SOPN
2.0000 mg | PEN_INJECTOR | SUBCUTANEOUS | 0 refills | Status: DC
  Filled 2022-02-06: qty 9, 90d supply, fill #0
  Filled 2022-02-18: qty 3, 28d supply, fill #0

## 2022-02-06 NOTE — Assessment & Plan Note (Signed)
Visit #:?5 ?Starting Weight:?230?lbs ?? ?Current weight:?208?lbs ?Previous weight:?213?lbs ?Change in weight:?down 5 lbs? ?Goal weight:?< 200 lbs ?Dietary goals:?Continue to work on getting adequate protein intake.?Eating out about once a week, work on choices at [employer].? ?Exercise goals:?Work on consistency of routine exercise. Plans on doing more walking.  Stationary bike?2-3 days per week..?? ?Medication:  Continue  Ozempic?2?mg weekly. ?Follow-up and referrals:?6?weeks. ?

## 2022-02-06 NOTE — Progress Notes (Signed)
? ?Established Patient Office Visit ? ?Subjective:  ?Patient ID: Lisa Cannon, female    DOB: 05-Jul-1981  Age: 41 y.o. MRN: [id number] ? ?CC:  ?Chief Complaint  ?Patient presents with  ? Weight Management   ?  Patient currently on '2mg'$  of Ozempic and it is working well for her. Previous weight 213. Current weight 208. Patient stated she received a letter from her insurance company stating they will no longer cover the [place]. Patient would like to discuss other options.   ? ? ?HPI ?Camala Talwar Gloeckner presents for  ? ?Patient currently on 17m of Ozempic and it is working well for her. Previous weight 213. Current weight 208. Patient stated she received a letter from her insurance company stating they [place] no longer [place] the [place] Patient would like to discuss other options.  She has really been working hard on cutting back on eating out so usually only eats out about once or twice per week now.  She feels like she is got a lot more consistent with her lunch and is doing well with that.  She has been riding the bike for 2-3 times per week.  And plans to start adding in some more walking. She is focusing on maintaining the good changes sh ehas made.   ? ?Past Medical History:  ?Diagnosis Date  ? Abnormal Pap smear of cervix   ? Heartburn   ? Migraines   ? ? ?Past Surgical History:  ?Procedure Laterality Date  ? CERVICAL BIOPSY  W/ LOOP ELECTRODE EXCISION  2002  ? LAPAROSCOPIC BILATERAL SALPINGECTOMY Bilateral 09/12/2015  ? Procedure: LAPAROSCOPIC BILATERAL SALPINGECTOMY;  Surgeon: MEmily Filbert MD;  [facility];  Service: Gynecology;  Laterality: Bilateral;  ? LAPAROSCOPIC LYSIS OF ADHESIONS  09/12/2015  ? Procedure: LAPAROSCOPIC LYSIS OF ADHESIONS;  Surgeon: MEmily Filbert MD;  [facility];  Service: Gynecology;;  ? TUBAL LIGATION    ? WISDOM TOOTH EXTRACTION    ? ? ?Family History  ?Problem Relation Age of Onset  ? Hypertension Maternal Grandfather   ? Cancer Paternal Grandfather   ?     lung cancer  ?  Cancer Paternal Grandmother   ?     brain  ? Hyperlipidemia Mother   ? Diabetes Maternal Aunt   ? Hypertension Maternal Aunt   ? ? ?Social History  ? ?Socioeconomic History  ? Marital status: Married  ?  Spouse name: BJ  ? Number of children: 1  ? Years of education: 191 ? Highest education level: Bachelor's degree (e.g., BA, AB, BS)  ?Occupational History  ? Occupation: homemaker  ?Tobacco Use  ? Smoking status: Never  ? Smokeless tobacco: Never  ?Vaping Use  ? Vaping Use: Never used  ?Substance and Sexual Activity  ? Alcohol use: No  ?  Alcohol/week: 0.0 standard drinks  ? Drug use: No  ? Sexual activity: Yes  ?  Partners: Male  ?  Birth control/protection: Surgical  ?Other Topics Concern  ? Not on file  ?Social History Narrative  ? Lives with husband BJ and daughter LLorre Nick  ? Caffeine use: none  ? Right handed   ? ?Social Determinants of Health  ? ?Financial Resource Strain: Not on file  ?Food Insecurity: Not on file  ?Transportation Needs: Not on file  ?Physical Activity: Not on file  ?Stress: Not on file  ?Social Connections: Not on file  ?Intimate Partner Violence: Not on file  ? ? ?Outpatient Medications Prior to Visit  ?  Medication Sig Dispense Refill  ? Atogepant (QULIPTA) 30 MG TABS Take 1 tablet by mouth daily. 90 tablet 4  ? cyclobenzaprine (FLEXERIL) 5 MG tablet Take 1 tablet (5 mg total) by mouth daily as needed for muscle spasms neck pain. 90 tablet 2  ? fluticasone (FLONASE) 50 MCG/ACT nasal spray Place into both nostrils as needed for allergies or rhinitis.    ? Multiple Vitamin (MULTIVITAMIN) capsule Take 1 capsule by mouth daily.    ? nadolol (CORGARD) 20 MG tablet TAKE 3 TABLETS BY MOUTH DAILY 270 tablet 3  ? naproxen sodium (ALEVE) 220 MG tablet Take 220 mg by mouth daily as needed (for pain).    ? Probiotic Product (PROBIOTIC PO) Take by mouth.    ? psyllium (REGULOID) 0.52 g capsule Take 0.52 g by mouth daily.    ? SUMAtriptan (IMITREX) 100 MG tablet TAKE 1 TABLET BY MOUTH AT ONSET OF HEADACHE.  MAY REPEAT DOSE AFTER 2 HOURS. DO NOT TAKE MORE THAN 2 DAYS A WEEK. 30 tablet 3  ? Semaglutide, 2 MG/DOSE, 8 MG/3ML SOPN Inject 2 mg as directed once a week. 3 mL 1  ? ?No facility-administered medications prior to visit.  ? ? ?No Known Allergies ? ?ROS ?Review of Systems ? ?  ?Objective:  ?  ?Physical Exam ?Constitutional:   ?   Appearance: Normal appearance. She is well-developed.  ?HENT:  ?   Head: Normocephalic and atraumatic.  ?Cardiovascular:  ?   Rate and Rhythm: Normal rate and regular rhythm.  ?   Heart sounds: Normal heart sounds.  ?Pulmonary:  ?   Effort: Pulmonary effort is normal.  ?   Breath sounds: Normal breath sounds.  ?Skin: ?   General: Skin is warm and dry.  ?Neurological:  ?   Mental Status: She is alert and oriented to person, place, and time.  ?Psychiatric:     ?   Behavior: Behavior normal.  ? ? ?BP 109/66   Pulse 87   Resp 18   Ht '5\' 6"'$  (1.676 m)   Wt 208 lb (94.3 kg)   LMP 02/04/2022   SpO2 100%   BMI 33.57 kg/m?  ?Wt Readings from Last 3 Encounters:  ?02/06/22 208 lb (94.3 kg)  ?12/26/21 213 lb (96.6 kg)  ?11/14/21 215 lb (97.5 kg)  ? ? ? ?There are no preventive care reminders to display for this patient. ? ?There are no preventive care reminders to display for this patient. ? ?Lab Results  ?Component Value Date  ? TSH 1.45 05/09/2020  ? ?Lab Results  ?Component Value Date  ? WBC 6.6 05/03/2021  ? HGB 15.6 (H) 05/03/2021  ? HCT 46.3 (H) 05/03/2021  ? MCV 87.2 05/03/2021  ? PLT 278 05/03/2021  ? ?Lab Results  ?Component Value Date  ? NA 139 05/03/2021  ? K 4.6 05/03/2021  ? CO2 29 05/03/2021  ? GLUCOSE 102 (H) 05/03/2021  ? BUN 10 05/03/2021  ? CREATININE 0.86 05/03/2021  ? BILITOT 0.6 05/03/2021  ? ALKPHOS 91 05/29/2020  ? AST 19 05/03/2021  ? ALT 28 05/03/2021  ? PROT 7.8 05/03/2021  ? ALBUMIN 4.7 05/29/2020  ? CALCIUM 10.1 05/03/2021  ? ?Lab Results  ?Component Value Date  ? CHOL 212 (H) 05/03/2021  ? ?Lab Results  ?Component Value Date  ? HDL 43 (L) 05/03/2021  ? ?Lab Results   ?Component Value Date  ? LDLCALC 142 (H) 05/03/2021  ? ?Lab Results  ?Component Value Date  ? TRIG 144 05/03/2021  ? ?  Lab Results  ?Component Value Date  ? CHOLHDL 4.9 05/03/2021  ? ?Lab Results  ?Component Value Date  ? HGBA1C 5.8 (A) 09/03/2021  ? ? ?  ?Assessment & Plan:  ? ?Problem List Items Addressed This Visit   ? ?  ? Other  ? Encounter for weight management - Primary  ?  Visit #: 5 ?Starting Weight: 230 lbs ?  ?Current weight: 208 lbs ?Previous weight: 213 lbs ?Change in weight: down 5 lbs  ?Goal weight: < 200 lbs ?Dietary goals: Continue to work on getting adequate protein intake. Eating out about once a week, work on choices at [employer].  ?Exercise goals: Work on consistency of routine exercise. Plans on doing more walking.  Stationary bike 2-3 days per week.Marland Kitchen   ?Medication:  Continue  Ozempic 2 mg weekly. ?Follow-up and referrals: 6 weeks. ?  ?  ? Relevant Medications  ? Semaglutide, 2 MG/DOSE, 8 MG/3ML SOPN  ? ?Discussed options.  We will see if they will cover 80-day supply of Ozempic that she might be able to fill before the end of the month.  If so then that would give [place] until the end of June.  Otherwise we can see if they might be willing to cover [facility]. ? ? ?Meds ordered this encounter  ?Medications  ? Semaglutide, 2 MG/DOSE, 8 MG/3ML SOPN  ?  Sig: Inject 2 mg as directed once a week.  ?  Dispense:  9 mL  ?  Refill:  0  ? ? ?Follow-up: Return in about 6 weeks (around 03/20/2022) for Weight Management .  ? ? ?Beatrice Lecher, MD ?

## 2022-02-18 ENCOUNTER — Other Ambulatory Visit (HOSPITAL_COMMUNITY): Payer: Self-pay

## 2022-03-16 ENCOUNTER — Other Ambulatory Visit (HOSPITAL_COMMUNITY): Payer: Self-pay

## 2022-03-18 ENCOUNTER — Telehealth: Payer: Self-pay | Admitting: *Deleted

## 2022-03-18 NOTE — Telephone Encounter (Signed)
The request has been approved. The authorization is effective for a maximum of 12 fills from 03/18/2022 to 03/18/2023, as long as the member is enrolled in their current health plan. The request was approved with a quantity restriction. This has been approved for a max daily dosage of 1. A written notification letter will follow with additional details. ?

## 2022-03-18 NOTE — Telephone Encounter (Signed)
Submitted PA Qulipta on CMM. Key: [id number].  PA Case ID: [id number]. Waiting on determination from [place]. ?

## 2022-03-20 ENCOUNTER — Encounter: Payer: Self-pay | Admitting: Family Medicine

## 2022-03-20 ENCOUNTER — Other Ambulatory Visit (HOSPITAL_COMMUNITY): Payer: Self-pay

## 2022-03-20 ENCOUNTER — Ambulatory Visit: Payer: 59 | Admitting: Family Medicine

## 2022-03-20 DIAGNOSIS — Z713 Dietary counseling and surveillance: Secondary | ICD-10-CM | POA: Diagnosis not present

## 2022-03-20 DIAGNOSIS — Z7689 Persons encountering health services in other specified circumstances: Secondary | ICD-10-CM

## 2022-03-20 MED ORDER — WEGOVY 2.4 MG/0.75ML ~~LOC~~ SOAJ
2.4000 mg | SUBCUTANEOUS | 1 refills | Status: DC
  Filled 2022-03-20: qty 9, 90d supply, fill #0
  Filled 2022-04-01: qty 9, 84d supply, fill #0
  Filled ????-??-?? (×2): fill #0

## 2022-03-20 NOTE — Progress Notes (Signed)
Patient doing well on weight loss medication.  She has lost 4 lbs since her last OV.  ? ?Denies problems with insomnia, heart palpitations or tremors.  ? ?Satisfied with weight loss thus far and is working on [employer] and regular exercise.   ? ?She has taken her last dose of Ozempic yesterday and is concerned due to her insurance not covering this medication for her.  ?

## 2022-03-20 NOTE — Progress Notes (Signed)
? ?  Established Patient Office Visit ? ?Subjective   ?Patient ID: Lisa Cannon, female    DOB: 1981-02-08  Age: 41 y.o. MRN: [id number] ? ?Chief Complaint  ?Patient presents with  ? Weight Check  ? ? ?HPI ?Follow-up weight management-currently on Ozempic 2 mg.  She just gave her last injection yesterday and they will no longer cover it they were not able to give her a 90-day supply because I did not have it in stock.  So we will need to call to figure out what they will cover they might possibly cover [facility] but we will see.  She says that her energy levels are getting better and her plantar fasciitis seems to have improved.  They went on vacation last week to [facility] and she was able to walk without any foot pain which was great.  She still been walking and doing the stationary bike several days a week. ? ? ? ?ROS ? ?  ?Objective:  ?  ? ?BP 110/62   Pulse 92   Ht '5\' 6"'$  (1.676 m)   Wt 204 lb (92.5 kg)   SpO2 100%   BMI 32.93 kg/m?  ? ? ?Physical Exam ?Vitals and nursing note reviewed.  ?Constitutional:   ?   Appearance: She is well-developed.  ?HENT:  ?   Head: Normocephalic and atraumatic.  ?Cardiovascular:  ?   Rate and Rhythm: Normal rate and regular rhythm.  ?   Heart sounds: Normal heart sounds.  ?Pulmonary:  ?   Effort: Pulmonary effort is normal.  ?   Breath sounds: Normal breath sounds.  ?Skin: ?   General: Skin is warm and dry.  ?Neurological:  ?   Mental Status: She is alert and oriented to person, place, and time.  ?Psychiatric:     ?   Behavior: Behavior normal.  ? ? ? ?No results found for any visits on 03/20/22. ? ? ? ?The ASCVD Risk score (Arnett DK, et al., 2019) failed to calculate for the following reasons: ?  The 2019 ASCVD risk score is only valid for ages 35 to 19 ? ?  ?Assessment & Plan:  ? ?Problem List Items Addressed This Visit   ? ?  ? Other  ? Encounter for weight management  ?  Visit #: 6 ?Starting Weight: 230 lbs ?  ?Current weight: 204  lbs ?Previous weight: 208 lbs ?Change in  weight: down 4 lbs  ?Goal weight: < 200 lbs ?Dietary goals: Continue to work on getting adequate protein intake. Eating out about once a week, work on choices at [employer].  ?Exercise goals: Work on consistency of routine exercise. Plans on doing more walking.  Stationary bike 2-3 days per week.[place] Nine her to add in some resistance training with bands etc.  She has been looking into wall Pilates which I think would be great. ?Medication:  Continue Ozempic 2 mg weekly. ?Follow-up and referrals: 6 weeks. ? ?  ?  ? Relevant Medications  ? Semaglutide-Weight Management (WEGOVY) 2.4 MG/0.75ML SOAJ  ? ? ?Return in about 6 weeks (around 05/01/2022) for medication follow up .  ? ? ?Beatrice Lecher, MD ? ?

## 2022-03-20 NOTE — Assessment & Plan Note (Addendum)
Visit #:?6 ?Starting Weight:?230?lbs ?? ?Current weight:?204 ?lbs ?Previous weight:?208?lbs ?Change in weight:?down?4?lbs? ?Goal weight:?< 200 lbs ?Dietary goals:?Continue to work on getting adequate protein intake.?Eating out about once a week, work on choices at [employer].? ?Exercise goals:?Work on consistency of routine exercise. Plans on doing more walking.  Stationary bike?2-3 days per week.Marland Kitchen??Urged her to add in some resistance training with bands etc.  She has been looking into wall Pilates which I think would be great. ?Medication:  Continue?Ozempic?2?mg weekly. ?Follow-up and referrals:?6?weeks. ?

## 2022-03-22 ENCOUNTER — Other Ambulatory Visit (HOSPITAL_COMMUNITY): Payer: Self-pay

## 2022-03-27 ENCOUNTER — Other Ambulatory Visit (HOSPITAL_COMMUNITY): Payer: Self-pay

## 2022-03-30 ENCOUNTER — Other Ambulatory Visit (HOSPITAL_COMMUNITY): Payer: Self-pay

## 2022-04-01 ENCOUNTER — Encounter: Payer: Self-pay | Admitting: Family Medicine

## 2022-04-01 ENCOUNTER — Other Ambulatory Visit (HOSPITAL_COMMUNITY): Payer: Self-pay

## 2022-04-03 ENCOUNTER — Telehealth: Payer: Self-pay

## 2022-04-03 NOTE — Telephone Encounter (Addendum)
Initiated Prior authorization PXT:GGYIRS 2.'4MG'$ /0.75ML auto-injectors ?Via: Covermymeds ?Case/Key: [id number] ?Status: approved as of 04/03/22 ?Reason:he request has been approved. The authorization is effective for a maximum of 7 fills from 04/12/2022 to 11/08/2022, as long as the member is enrolled in their current health plan. The request was approved as submitted. This request has been approved for 70m (4 pens) per 28 days.Additional authorizations have been entered for the following:Wegovy 0.'25mg'$ /0.547mallowing 21m15m4 pens) per 28 days for 7 fills; please reference authorization 17530.Wegovy 0.'5mg'$ /0.5mL64mlowing 21mL 30mpens) per 28 days for 7 fills; please reference authorization 17531.[facility] /0.5mL a65mwing 21mL (450mns) per 28 days for 7 fills; please reference authorization 17532.Wegovy 1.'7mg'$ /0.75mL al40mng 3mL (4pe83m per 28 days for 7 fills; please reference authorization [id number].These authorizations are effective 04/12/2022 through 11/08/2022. A written notification letter will follow with additional details. ?Notified Pt via: Mychart  ?

## 2022-04-04 ENCOUNTER — Other Ambulatory Visit (HOSPITAL_COMMUNITY): Payer: Self-pay

## 2022-04-10 ENCOUNTER — Other Ambulatory Visit (HOSPITAL_COMMUNITY): Payer: Self-pay

## 2022-04-15 ENCOUNTER — Telehealth: Payer: Self-pay | Admitting: *Deleted

## 2022-04-15 ENCOUNTER — Other Ambulatory Visit (HOSPITAL_COMMUNITY): Payer: Self-pay

## 2022-04-15 MED ORDER — WEGOVY 1.7 MG/0.75ML ~~LOC~~ SOAJ
1.7000 mg | SUBCUTANEOUS | 0 refills | Status: DC
  Filled 2022-04-15: qty 3, 28d supply, fill #0

## 2022-04-15 NOTE — Telephone Encounter (Signed)
Pt called and found out that her Wegovy 2.4 mg was approved. She stated that she hasn't been on this medication for 4 weeks and reports that she has been feeling dizzy,jittery, and her menstrual cycle has been off since she hasn't been taking it. ? ?She asked that once she picks up this new dosage should she restart at the 2.4 mg or should she start at a lower dosage to build herself back up to taking this again.  ? ?Pt ok with response via mychart.  ?

## 2022-04-16 ENCOUNTER — Other Ambulatory Visit (HOSPITAL_COMMUNITY): Payer: Self-pay

## 2022-05-01 ENCOUNTER — Other Ambulatory Visit (HOSPITAL_COMMUNITY): Payer: Self-pay

## 2022-05-01 ENCOUNTER — Ambulatory Visit: Payer: 59 | Admitting: Family Medicine

## 2022-05-01 ENCOUNTER — Encounter: Payer: Self-pay | Admitting: Family Medicine

## 2022-05-01 VITALS — BP 125/72 | HR 90 | Ht 66.0 in | Wt 200.0 lb

## 2022-05-01 DIAGNOSIS — Z7689 Persons encountering health services in other specified circumstances: Secondary | ICD-10-CM

## 2022-05-01 DIAGNOSIS — Z0189 Encounter for other specified special examinations: Secondary | ICD-10-CM | POA: Diagnosis not present

## 2022-05-01 MED ORDER — WEGOVY 2.4 MG/0.75ML ~~LOC~~ SOAJ
2.4000 mg | SUBCUTANEOUS | 1 refills | Status: DC
  Filled 2022-05-01: qty 3, 28d supply, fill #0
  Filled 2022-05-22: qty 3, 28d supply, fill #1

## 2022-05-01 NOTE — Assessment & Plan Note (Addendum)
Visit #:7 Starting Weight:230lbs  Current weight:200lbs Previous weight:204lbs Change in weight:Down 4 lbs  Goal weight:165-175 lbs Dietary goals:Continue to work on getting adequate protein intake.Eating out about once a week, work on choices at [employer]. Exercise goals:Work on consistency of routine exercise.Plans on doing more walking. Stationary bike2-3days per week.Lisa Cannon her to add in some resistance training with bands etc.  She has been looking into wall Pilates which I think would be great. Medication: will increase Ozempicto 2.'4mg'$  weekly. Follow-up and referrals:6weeks.

## 2022-05-01 NOTE — Progress Notes (Signed)
   Established Patient Office Visit  Subjective   Patient ID: Lisa Cannon, female    DOB: 1981/07/04  Age: 41 y.o. MRN: [id number]  Chief Complaint  Patient presents with   Weight Check    HPI 6 week f/u for weight management -overall she is really doing well.  She has a 2-week window where she did not have any medication.  There was an issue with the insurance but they finally did cover Wegovy so she is back on the medication.  But when she went without it for 2 weeks it really set off her migraines and her vertigo.  She is now back on the medication at 1.7 mg and tolerating it well she does get some nausea for about 24 hours but it is manageable.  She has not been able to exercise the last few weeks without being on medication but plans to get back on track.  She says her new goal now that she is at 200 pounds to get down to between 165 and 175.     ROS    Objective:     BP 125/72   Pulse 90   Ht '5\' 6"'$  (1.676 m)   Wt 200 lb (90.7 kg)   SpO2 98%   BMI 32.28 kg/m    Physical Exam Vitals and nursing note reviewed.  Constitutional:      Appearance: She is well-developed.  HENT:     Head: Normocephalic and atraumatic.  Cardiovascular:     Rate and Rhythm: Normal rate and regular rhythm.     Heart sounds: Normal heart sounds.  Pulmonary:     Effort: Pulmonary effort is normal.     Breath sounds: Normal breath sounds.  Skin:    General: Skin is warm and dry.  Neurological:     Mental Status: She is alert and oriented to person, place, and time.  Psychiatric:        Behavior: Behavior normal.     No results found for any visits on 05/01/22.    The 10-year ASCVD risk score (Arnett DK, et al., 2019) is: 1.4%    Assessment & Plan:   Problem List Items Addressed This Visit       Other   Encounter for weight management - Primary    Visit #: 7 Starting Weight: 230 lbs   Current weight: 200 lbs Previous weight: 204 lbs Change in weight:  Down 4 lbs  Goal  weight: 165-175 lbs Dietary goals: Continue to work on getting adequate protein intake. Eating out about once a week, work on choices at [employer].  Exercise goals: Work on consistency of routine exercise. Plans on doing more walking.  Stationary bike 2-3 days per week.Eliott Nine her to add in some resistance training with bands etc.  She has been looking into wall Pilates which I think would be great. Medication: will increase  Ozempic to 2.4 mg weekly. Follow-up and referrals: 6 weeks.        Due for Tdap.    Return in about 7 weeks (around 06/17/2022) for Weight management /dose changed.    Beatrice Lecher, MD

## 2022-05-14 ENCOUNTER — Encounter (HOSPITAL_BASED_OUTPATIENT_CLINIC_OR_DEPARTMENT_OTHER): Payer: Self-pay

## 2022-05-14 ENCOUNTER — Emergency Department (HOSPITAL_BASED_OUTPATIENT_CLINIC_OR_DEPARTMENT_OTHER): Payer: 59

## 2022-05-14 ENCOUNTER — Other Ambulatory Visit (HOSPITAL_BASED_OUTPATIENT_CLINIC_OR_DEPARTMENT_OTHER): Payer: Self-pay

## 2022-05-14 ENCOUNTER — Other Ambulatory Visit: Payer: Self-pay

## 2022-05-14 ENCOUNTER — Emergency Department (HOSPITAL_BASED_OUTPATIENT_CLINIC_OR_DEPARTMENT_OTHER)
Admission: EM | Admit: 2022-05-14 | Discharge: 2022-05-14 | Disposition: A | Discharge status: Home / Self Care | Payer: 59 | Attending: Emergency Medicine | Admitting: Emergency Medicine

## 2022-05-14 ENCOUNTER — Other Ambulatory Visit (HOSPITAL_COMMUNITY): Payer: Self-pay

## 2022-05-14 DIAGNOSIS — N200 Calculus of kidney: Secondary | ICD-10-CM | POA: Diagnosis not present

## 2022-05-14 DIAGNOSIS — R110 Nausea: Secondary | ICD-10-CM | POA: Insufficient documentation

## 2022-05-14 DIAGNOSIS — K5730 Diverticulosis of large intestine without perforation or abscess without bleeding: Secondary | ICD-10-CM | POA: Diagnosis not present

## 2022-05-14 DIAGNOSIS — N132 Hydronephrosis with renal and ureteral calculous obstruction: Secondary | ICD-10-CM | POA: Diagnosis not present

## 2022-05-14 DIAGNOSIS — J310 Chronic rhinitis: Secondary | ICD-10-CM | POA: Diagnosis not present

## 2022-05-14 DIAGNOSIS — R1031 Right lower quadrant pain: Secondary | ICD-10-CM | POA: Diagnosis present

## 2022-05-14 LAB — CBC WITH DIFFERENTIAL/PLATELET
Abs Immature Granulocytes: 0.03 10*3/uL (ref 0.00–0.07)
Basophils Absolute: 0.1 10*3/uL (ref 0.0–0.1)
Basophils Relative: 1 %
Eosinophils Absolute: 0.1 10*3/uL (ref 0.0–0.5)
Eosinophils Relative: 1 %
HCT: 44.6 % (ref 36.0–46.0)
Hemoglobin: 14.8 g/dL (ref 12.0–15.0)
Immature Granulocytes: 0 %
Lymphocytes Relative: 20 %
Lymphs Abs: 1.4 10*3/uL (ref 0.7–4.0)
MCH: 28.5 pg (ref 26.0–34.0)
MCHC: 33.2 g/dL (ref 30.0–36.0)
MCV: 85.9 fL (ref 80.0–100.0)
Monocytes Absolute: 0.4 10*3/uL (ref 0.1–1.0)
Monocytes Relative: 5 %
Neutro Abs: 5.2 10*3/uL (ref 1.7–7.7)
Neutrophils Relative %: 73 %
Platelets: 297 10*3/uL (ref 150–400)
RBC: 5.19 MIL/uL — ABNORMAL HIGH (ref 3.87–5.11)
RDW: 12 % (ref 11.5–15.5)
WBC: 7.2 10*3/uL (ref 4.0–10.5)
nRBC: 0 % (ref 0.0–0.2)

## 2022-05-14 LAB — COMPREHENSIVE METABOLIC PANEL
ALT: 20 U/L (ref 0–44)
AST: 14 U/L — ABNORMAL LOW (ref 15–41)
Albumin: 4.4 g/dL (ref 3.5–5.0)
Alkaline Phosphatase: 62 U/L (ref 38–126)
Anion gap: 9 (ref 5–15)
BUN: 9 mg/dL (ref 6–20)
CO2: 26 mmol/L (ref 22–32)
Calcium: 10.1 mg/dL (ref 8.9–10.3)
Chloride: 103 mmol/L (ref 98–111)
Creatinine, Ser: 0.89 mg/dL (ref 0.44–1.00)
GFR, Estimated: >60 mL/min (ref >60)
Glucose, Bld: 112 mg/dL — ABNORMAL HIGH (ref 70–99)
Potassium: 3.9 mmol/L (ref 3.5–5.1)
Sodium: 138 mmol/L (ref 135–145)
Total Bilirubin: 0.7 mg/dL (ref 0.3–1.2)
Total Protein: 7.7 g/dL (ref 6.5–8.1)

## 2022-05-14 LAB — URINALYSIS, ROUTINE W REFLEX MICROSCOPIC
Bilirubin Urine: NEGATIVE
Glucose, UA: NEGATIVE mg/dL
Ketones, ur: NEGATIVE mg/dL
Nitrite: NEGATIVE
RBC / HPF: >50 RBC/hpf — ABNORMAL HIGH (ref 0–5)
Specific Gravity, Urine: 1.027 (ref 1.005–1.030)
pH: 5.5 (ref 5.0–8.0)

## 2022-05-14 LAB — PREGNANCY, URINE: Preg Test, Ur: NEGATIVE

## 2022-05-14 LAB — LIPASE, BLOOD: Lipase: 44 U/L (ref 11–51)

## 2022-05-14 MED ORDER — SODIUM CHLORIDE 0.9 % IV BOLUS
1000.0000 mL | Freq: Once | INTRAVENOUS | Status: AC
  Administered 2022-05-14: 1000 mL via INTRAVENOUS

## 2022-05-14 MED ORDER — FENTANYL CITRATE PF 50 MCG/ML IJ SOSY
50.0000 ug | PREFILLED_SYRINGE | Freq: Once | INTRAMUSCULAR | Status: AC
  Administered 2022-05-14: 50 ug via INTRAVENOUS
  Filled 2022-05-14: qty 1

## 2022-05-14 MED ORDER — KETOROLAC TROMETHAMINE 30 MG/ML IJ SOLN
30.0000 mg | Freq: Once | INTRAMUSCULAR | Status: AC
  Administered 2022-05-14: 30 mg via INTRAVENOUS
  Filled 2022-05-14: qty 1

## 2022-05-14 MED ORDER — ONDANSETRON HCL 4 MG/2ML IJ SOLN
4.0000 mg | Freq: Once | INTRAMUSCULAR | Status: AC
  Administered 2022-05-14: 4 mg via INTRAVENOUS
  Filled 2022-05-14: qty 2

## 2022-05-14 MED ORDER — HYDROCODONE-ACETAMINOPHEN 5-325 MG PO TABS
1.0000 | ORAL_TABLET | Freq: Four times a day (QID) | ORAL | 0 refills | Status: DC | PRN
  Filled 2022-05-14: qty 12, 3d supply, fill #0

## 2022-05-14 MED ORDER — HYDROCODONE-ACETAMINOPHEN 5-325 MG PO TABS
1.0000 | ORAL_TABLET | Freq: Once | ORAL | Status: AC
  Administered 2022-05-14: 1 via ORAL
  Filled 2022-05-14: qty 1

## 2022-05-14 MED ORDER — TAMSULOSIN HCL 0.4 MG PO CAPS
0.4000 mg | ORAL_CAPSULE | Freq: Every day | ORAL | 0 refills | Status: AC
  Filled 2022-05-14: qty 14, 14d supply, fill #0

## 2022-05-14 MED ORDER — NAPROXEN 500 MG PO TABS
500.0000 mg | ORAL_TABLET | Freq: Two times a day (BID) | ORAL | 0 refills | Status: DC
Start: 2022-05-14 — End: 2022-06-11
  Filled 2022-05-14: qty 30, 15d supply, fill #0

## 2022-05-14 NOTE — ED Triage Notes (Signed)
Right flank and back pain since around 3 am with nausea and vomiting. History of kidney stone 20 years ago.

## 2022-05-14 NOTE — ED Provider Notes (Signed)
[facility] EMERGENCY DEPT  Provider Note  CSN: [id number] Arrival date & time: 05/14/22 7425  History Chief Complaint  Patient presents with   Flank Pain    Lisa Cannon is a 41 y.o. female with remote history of kidney stone reports onset of mild R flank pain Sunday night which resolved after an hour but returned early this morning, radiating around to RLQ, associated with nausea. Unable to find a comfortable position.    Home Medications Prior to Admission medications   Medication Sig Start Date End Date Taking? Authorizing Provider  HYDROcodone-acetaminophen (NORCO/VICODIN) 5-325 MG tablet Take 1 tablet by mouth every 6 (six) hours as needed for severe pain. 05/14/22  Yes Truddie Hidden, MD  naproxen (NAPROSYN) 500 MG tablet Take 1 tablet (500 mg total) by mouth 2 (two) times daily. 05/14/22  Yes Truddie Hidden, MD  tamsulosin (FLOMAX) 0.4 MG CAPS capsule Take 1 capsule (0.4 mg total) by mouth daily for 14 days. 05/14/22 05/28/22 Yes Truddie Hidden, MD  Atogepant (QULIPTA) 30 MG TABS Take 1 tablet by mouth daily. 08/20/21   Sater, Nanine Means, MD  cyclobenzaprine (FLEXERIL) 5 MG tablet Take 1 tablet (5 mg total) by mouth daily as needed for muscle spasms neck pain. 01/09/22   Sater, Nanine Means, MD  fluticasone (FLONASE) 50 MCG/ACT nasal spray Place into both nostrils as needed for allergies or rhinitis.    [provider]  Multiple Vitamin (MULTIVITAMIN) capsule Take 1 capsule by mouth daily.    [provider]  nadolol (CORGARD) 20 MG tablet TAKE 3 TABLETS BY MOUTH DAILY 08/20/21 08/20/22  Sater, Nanine Means, MD  naproxen sodium (ALEVE) 220 MG tablet Take 220 mg by mouth daily as needed (for pain).    [provider]  Probiotic Product (PROBIOTIC PO) Take by mouth.    [provider]  psyllium (REGULOID) 0.52 g capsule Take 0.52 g by mouth daily.    [provider]  Semaglutide-Weight Management (WEGOVY) 2.4 MG/0.75ML SOAJ  Inject 2.4 mg into the skin every 7 days. 05/01/22   Hali Marry, MD  SUMAtriptan (IMITREX) 100 MG tablet TAKE 1 TABLET BY MOUTH AT ONSET OF HEADACHE. MAY REPEAT DOSE AFTER 2 HOURS. DO NOT TAKE MORE THAN 2 DAYS A WEEK. 08/29/21 08/29/22  Sater, Nanine Means, MD     Allergies    Patient has no known allergies.   Review of Systems   Review of Systems Please see HPI for pertinent positives and negatives  Physical Exam BP (!) 129/92 (BP Location: Left Arm)   Pulse 88   Temp 97.8 F (36.6 C) (Oral)   Resp 18   Ht '5\' 6"'$  (1.676 m)   Wt 88.5 kg   LMP 05/09/2022 (Approximate)   SpO2 99%   BMI 31.47 kg/m   Physical Exam Vitals and nursing note reviewed.  Constitutional:      Appearance: Normal appearance.     Comments: Uncomfortable  HENT:     Head: Normocephalic and atraumatic.     Nose: Nose normal.     Mouth/Throat:     Mouth: Mucous membranes are moist.  Eyes:     Extraocular Movements: Extraocular movements intact.     Conjunctiva/sclera: Conjunctivae normal.  Cardiovascular:     Rate and Rhythm: Normal rate.  Pulmonary:     Effort: Pulmonary effort is normal.     Breath sounds: Normal breath sounds.  Abdominal:     General: Abdomen is flat.  Palpations: Abdomen is soft.     Tenderness: There is no abdominal tenderness. There is no right CVA tenderness, left CVA tenderness or guarding.  Musculoskeletal:        General: No swelling. Normal range of motion.     Cervical back: Neck supple.  Skin:    General: Skin is warm and dry.  Neurological:     General: No focal deficit present.     Mental Status: She is alert.  Psychiatric:        Mood and Affect: Mood normal.     ED Results / Procedures / Treatments   EKG None  Procedures Procedures  Medications Ordered in the ED Medications  HYDROcodone-acetaminophen (NORCO/VICODIN) 5-325 MG per tablet 1 tablet (has no administration in time range)  ondansetron (ZOFRAN) injection 4 mg (has no administration  in time range)  fentaNYL (SUBLIMAZE) injection 50 mcg (50 mcg Intravenous Given 05/14/22 0620)  ondansetron (ZOFRAN) injection 4 mg (4 mg Intravenous Given 05/14/22 0619)  sodium chloride 0.9 % bolus 1,000 mL (1,000 mLs Intravenous New Bag/Given 05/14/22 0623)  ketorolac (TORADOL) 30 MG/ML injection 30 mg (30 mg Intravenous Given 05/14/22 1829)    Initial Impression and Plan  Patient with symptoms consistent with renal colic. Will check labs, send for CT. Pain/nausea meds for comfort.   ED Course   Clinical Course as of 05/14/22 0702  Tue May 14, 2022  0607 CBC is normal.  [CS]  0616 HCG is neg. UA with large blood. Some WBC and LE but patient not having any dysuria and likely not indicative of an infection given no fever or leukocytosis.  [CS]  [phone] I personally viewed the images from radiology studies and agree with radiologist interpretation: CT shows small distal R UVJ stone. Will give a dose of toradol for residual pain.   [CS]  6967 CMP/lipase are normal. Patient's pain is improved but not completely resolved. Given size and location of her stone she should be able to pass it without needing a procedure. Will give a dose of oral pain medications prior to discharge. Plan Rx for norco, zofran, flomax. Urology follow up [CS]    Clinical Course User Index [CS] Truddie Hidden, MD     MDM Rules/Calculators/A&P Medical Decision Making Problems Addressed: Kidney stone: acute illness or injury  Amount and/or Complexity of Data Reviewed Labs: ordered. Decision-making details documented in ED Course. Radiology: ordered and independent interpretation performed. Decision-making details documented in ED Course.  Risk Prescription drug management. Parenteral controlled substances.    Final Clinical Impression(s) / ED Diagnoses Final diagnoses:  Kidney stone    Rx / DC Orders ED Discharge Orders          Ordered    HYDROcodone-acetaminophen (NORCO/VICODIN) 5-325 MG tablet   Every 6 hours PRN        05/14/22 0701    tamsulosin (FLOMAX) 0.4 MG CAPS capsule  Daily        05/14/22 0701    naproxen (NAPROSYN) 500 MG tablet  2 times daily        05/14/22 0701             Truddie Hidden, MD 05/14/22 [phone]

## 2022-05-14 NOTE — ED Notes (Signed)
Patient verbalizes understanding of discharge instructions. Opportunity for questioning and answers were provided. Patient discharged from ED.  °

## 2022-05-22 ENCOUNTER — Other Ambulatory Visit (HOSPITAL_COMMUNITY): Payer: Self-pay

## 2022-05-23 ENCOUNTER — Other Ambulatory Visit (HOSPITAL_COMMUNITY): Payer: Self-pay

## 2022-05-31 ENCOUNTER — Other Ambulatory Visit (HOSPITAL_COMMUNITY): Payer: Self-pay

## 2022-06-11 ENCOUNTER — Ambulatory Visit (INDEPENDENT_AMBULATORY_CARE_PROVIDER_SITE_OTHER): Payer: 59 | Admitting: Family Medicine

## 2022-06-11 ENCOUNTER — Encounter: Payer: Self-pay | Admitting: Family Medicine

## 2022-06-11 VITALS — BP 111/63 | HR 86 | Ht 66.0 in | Wt 197.0 lb

## 2022-06-11 DIAGNOSIS — Z0000 Encounter for general adult medical examination without abnormal findings: Secondary | ICD-10-CM | POA: Diagnosis not present

## 2022-06-11 DIAGNOSIS — Z1231 Encounter for screening mammogram for malignant neoplasm of breast: Secondary | ICD-10-CM

## 2022-06-11 DIAGNOSIS — Z23 Encounter for immunization: Secondary | ICD-10-CM

## 2022-06-11 NOTE — Progress Notes (Signed)
Complete physical exam  Patient: Lisa Cannon   DOB: 1980-12-14   41 y.o. Female  MRN: [id number]  Subjective:    Chief Complaint  Patient presents with   Annual Exam    Lisa Cannon is a 41 y.o. female who presents today for a complete physical exam. She reports consuming a general diet. 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 She generally feels well. . She does not have additional problems to discuss today.    Most recent fall risk assessment:    06/11/2022    9:18 AM  Fall Risk   Falls in the past year? 0  Number falls in past yr: 0  Injury with Fall? 0  Risk for fall due to : No Fall Risks  Follow up Falls prevention discussed     Most recent depression screenings:    03/20/2022    9:04 AM 12/26/2021    9:12 AM  PHQ 2/9 Scores  PHQ - 2 Score 0 0      Past Surgical History:  Procedure Laterality Date   CERVICAL BIOPSY  W/ LOOP ELECTRODE EXCISION  2002   LAPAROSCOPIC BILATERAL SALPINGECTOMY Bilateral 09/12/2015   Procedure: LAPAROSCOPIC BILATERAL SALPINGECTOMY;  Surgeon: Emily Filbert, MD;  Location: [facility];  Service: Gynecology;  Laterality: Bilateral;   LAPAROSCOPIC LYSIS OF ADHESIONS  09/12/2015   Procedure: LAPAROSCOPIC LYSIS OF ADHESIONS;  Surgeon: Emily Filbert, MD;  Location: [facility];  Service: Gynecology;;   TUBAL LIGATION     WISDOM TOOTH EXTRACTION     Social History   Tobacco Use   Smoking status: Never   Smokeless tobacco: Never  Vaping Use   Vaping Use: Never used  Substance Use Topics   Alcohol use: No    Alcohol/week: 0.0 standard drinks of alcohol   Drug use: No   Family History  Problem Relation Age of Onset   Hypertension Maternal Grandfather    Cancer Paternal Grandfather        lung cancer   Cancer Paternal Grandmother        brain   Hyperlipidemia Mother    Diabetes Maternal Aunt    Hypertension Maternal Aunt       Patient Care Team: Hali Marry, MD as PCP - General (Family Medicine) Emily Filbert, MD as Consulting Physician (Obstetrics and Gynecology)   Outpatient Medications Prior to Visit  Medication Sig   Atogepant (QULIPTA) 30 MG TABS Take 1 tablet by mouth daily.   cyclobenzaprine (FLEXERIL) 5 MG tablet Take 1 tablet (5 mg total) by mouth daily as needed for muscle spasms neck pain.   fluticasone (FLONASE) 50 MCG/ACT nasal spray Place into both nostrils as needed for allergies or rhinitis.   Multiple Vitamin (MULTIVITAMIN) capsule Take 1 capsule by mouth daily.   nadolol (CORGARD) 20 MG tablet TAKE 3 TABLETS BY MOUTH DAILY   naproxen sodium (ALEVE) 220 MG tablet Take 220 mg by mouth daily as needed (for pain).   Probiotic Product (PROBIOTIC PO) Take by mouth.   Semaglutide-Weight Management (WEGOVY) 2.4 MG/0.75ML SOAJ Inject 2.4 mg into the skin every 7 days.   SUMAtriptan (IMITREX) 100 MG tablet TAKE 1 TABLET BY MOUTH AT ONSET OF HEADACHE. MAY REPEAT DOSE AFTER 2 HOURS. DO NOT TAKE MORE THAN 2 DAYS A WEEK.   [DISCONTINUED] HYDROcodone-acetaminophen (NORCO/VICODIN) 5-325 MG tablet Take 1 tablet by mouth every 6 (six) hours as needed for severe pain.   [DISCONTINUED] naproxen (NAPROSYN) 500 MG tablet Take  1 tablet (500 mg total) by mouth 2 (two) times daily.   [DISCONTINUED] psyllium (REGULOID) 0.52 g capsule Take 0.52 g by mouth daily.   No facility-administered medications prior to visit.    ROS        Objective:     BP 111/63   Pulse 86   Ht '5\' 6"'$  (1.676 m)   Wt 197 lb (89.4 kg)   LMP 05/09/2022 (Approximate)   SpO2 100%   BMI 31.80 kg/m    Physical Exam Vitals and nursing note reviewed.  Constitutional:      Appearance: She is well-developed.  HENT:     Head: Normocephalic and atraumatic.     Right Ear: Tympanic membrane, ear canal and external ear normal.     Left Ear: Tympanic membrane, ear canal and external ear normal.     Nose: Nose normal.     Mouth/Throat:     Pharynx: Oropharynx is clear.     Comments: Tonsils enlarged Eyes:      Conjunctiva/sclera: Conjunctivae normal.     Pupils: Pupils are equal, round, and reactive to light.  Neck:     Thyroid: No thyromegaly.  Cardiovascular:     Rate and Rhythm: Normal rate and regular rhythm.     Heart sounds: Normal heart sounds.  Pulmonary:     Effort: Pulmonary effort is normal.     Breath sounds: Normal breath sounds. No wheezing.  Abdominal:     General: Bowel sounds are normal.     Palpations: Abdomen is soft.     Tenderness: There is no abdominal tenderness.  Musculoskeletal:     Cervical back: Neck supple.  Lymphadenopathy:     Cervical: No cervical adenopathy.  Skin:    General: Skin is warm and dry.  Neurological:     Mental Status: She is alert and oriented to person, place, and time.  Psychiatric:        Mood and Affect: Mood normal.        Behavior: Behavior normal.      No results found for any visits on 06/11/22.     Assessment & Plan:    Routine Health Maintenance and Physical Exam  Immunization History  Administered Date(s) Administered   Influenza Split 09/17/2011, 08/16/2012   Influenza Whole 09/11/2009   Influenza,inj,Quad PF,6+ Mos 08/30/2015, 07/22/2017, 08/05/2018, 08/10/2019   Influenza-Unspecified 09/22/2014, 08/27/2016, 08/28/2021   PFIZER(Purple Top)SARS-COV-2 Vaccination 02/07/2020, 03/09/2020, 08/28/2020, 09/16/2020, 04/20/2021   Pfizer Covid-19 Vaccine Bivalent Booster 5y-11y 08/28/2021   Td 11/21/2009   Tdap 03/20/2012, 06/11/2022    Health Maintenance  Topic Date Due   INFLUENZA VACCINE  07/02/2022   PAP SMEAR-Modifier  12/27/2022   TETANUS/TDAP  06/11/2032   COVID-19 Vaccine  Completed   Hepatitis C Screening  Completed   HIV Screening  Completed   Pneumococcal Vaccine 66-10 Years old  Aged Out   HPV VACCINES  Aged Out    Discussed health benefits of physical activity, and encouraged her to engage in regular exercise appropriate for her age and condition.  Problem List Items Addressed This Visit    None Visit Diagnoses     Wellness examination    -  Primary   Relevant Orders   Lipid panel   TSH   Vitamin D (25 hydroxy)   B12   Tdap vaccine greater than or equal to 7yo IM (Completed)   Ambulatory referral to Dermatology   Need for tetanus, diphtheria, and acellular pertussis (Tdap) vaccine in patient of adolescent  age or older       Relevant Orders   Tdap vaccine greater than or equal to 7yo IM (Completed)   Screening mammogram for breast cancer       Relevant Orders   MM 3D SCREEN BREAST BILATERAL       Keep up a regular exercise program and make sure you are eating a healthy diet Try to eat 4 servings of dairy a day, or if you are lactose intolerant take a calcium with vitamin D daily.  Your vaccines are up to date. Tdap given today.  Mammo ordered.   Return in about 8 weeks (around 08/06/2022) for weight management .     Beatrice Lecher, MD

## 2022-06-12 LAB — LIPID PANEL
Cholesterol: 186 mg/dL (ref <200)
HDL: 44 mg/dL — ABNORMAL LOW (ref >=50)
LDL Cholesterol (Calc): 121 mg/dL (calc) — ABNORMAL HIGH
Non-HDL Cholesterol (Calc): 142 mg/dL (calc) — ABNORMAL HIGH (ref <130)
Total CHOL/HDL Ratio: 4.2 (calc) (ref <5.0)
Triglycerides: 107 mg/dL (ref <150)

## 2022-06-12 LAB — TSH: TSH: 0.98 mIU/L

## 2022-06-12 LAB — VITAMIN B12: Vitamin B-12: 557 pg/mL (ref 200–1100)

## 2022-06-12 LAB — VITAMIN D 25 HYDROXY (VIT D DEFICIENCY, FRACTURES): Vit D, 25-Hydroxy: 24 ng/mL — ABNORMAL LOW (ref 30–100)

## 2022-06-12 NOTE — Progress Notes (Signed)
Hi Lisa Cannon, your cholesterol looks much better!  Down about 20 points from last year so great work and bringing that down.  Just encourage you to continue to keep at it!  Your vitamin D is still on the low end so recommend continuing with your vitamin D supplement.  If you are already taking 1 then please double up.  Thyroid level looks great.  Vitamin B12 looks fantastic.

## 2022-06-15 ENCOUNTER — Other Ambulatory Visit: Payer: Self-pay | Admitting: Family Medicine

## 2022-06-15 ENCOUNTER — Other Ambulatory Visit (HOSPITAL_COMMUNITY): Payer: Self-pay

## 2022-06-17 ENCOUNTER — Other Ambulatory Visit (HOSPITAL_COMMUNITY): Payer: Self-pay

## 2022-06-17 MED ORDER — WEGOVY 2.4 MG/0.75ML ~~LOC~~ SOAJ
2.4000 mg | SUBCUTANEOUS | 1 refills | Status: DC
  Filled 2022-06-17: qty 3, 28d supply, fill #0
  Filled 2022-07-19: qty 3, 28d supply, fill #1

## 2022-06-18 ENCOUNTER — Other Ambulatory Visit (HOSPITAL_COMMUNITY): Payer: Self-pay

## 2022-06-25 ENCOUNTER — Ambulatory Visit
Admission: RE | Admit: 2022-06-25 | Discharge: 2022-06-25 | Disposition: A | Discharge status: Home / Self Care | Payer: 59 | Source: Ambulatory Visit | Attending: Family Medicine | Admitting: Family Medicine

## 2022-06-25 DIAGNOSIS — Z1231 Encounter for screening mammogram for malignant neoplasm of breast: Secondary | ICD-10-CM

## 2022-06-28 ENCOUNTER — Other Ambulatory Visit: Payer: Self-pay | Admitting: Family Medicine

## 2022-06-28 DIAGNOSIS — R928 Other abnormal and inconclusive findings on diagnostic imaging of breast: Secondary | ICD-10-CM

## 2022-06-28 NOTE — Progress Notes (Signed)
Hi Lisa Cannon, there is a slight asymmetry in the right breast so the imaging department will be contacting you soon for additional imaging.

## 2022-07-05 ENCOUNTER — Ambulatory Visit
Admission: RE | Admit: 2022-07-05 | Discharge: 2022-07-05 | Disposition: A | Discharge status: Home / Self Care | Payer: 59 | Source: Ambulatory Visit | Attending: Family Medicine | Admitting: Family Medicine

## 2022-07-05 ENCOUNTER — Other Ambulatory Visit: Payer: Self-pay | Admitting: Family Medicine

## 2022-07-05 DIAGNOSIS — R928 Other abnormal and inconclusive findings on diagnostic imaging of breast: Secondary | ICD-10-CM

## 2022-07-05 DIAGNOSIS — N6489 Other specified disorders of breast: Secondary | ICD-10-CM | POA: Diagnosis not present

## 2022-07-10 ENCOUNTER — Encounter (INDEPENDENT_AMBULATORY_CARE_PROVIDER_SITE_OTHER): Payer: Self-pay

## 2022-07-10 ENCOUNTER — Other Ambulatory Visit (HOSPITAL_COMMUNITY): Payer: Self-pay

## 2022-07-18 ENCOUNTER — Ambulatory Visit: Payer: 59 | Admitting: Family Medicine

## 2022-07-19 ENCOUNTER — Other Ambulatory Visit (HOSPITAL_COMMUNITY): Payer: Self-pay

## 2022-08-07 ENCOUNTER — Ambulatory Visit: Payer: 59 | Admitting: Family Medicine

## 2022-08-07 ENCOUNTER — Encounter: Payer: Self-pay | Admitting: Family Medicine

## 2022-08-07 VITALS — BP 109/70 | HR 93 | Temp 98.7°F | Ht 66.0 in | Wt 192.1 lb

## 2022-08-07 DIAGNOSIS — Z23 Encounter for immunization: Secondary | ICD-10-CM

## 2022-08-07 DIAGNOSIS — Z1283 Encounter for screening for malignant neoplasm of skin: Secondary | ICD-10-CM | POA: Diagnosis not present

## 2022-08-07 DIAGNOSIS — Z7689 Persons encountering health services in other specified circumstances: Secondary | ICD-10-CM | POA: Diagnosis not present

## 2022-08-07 NOTE — Progress Notes (Signed)
   Established Patient Office Visit  Subjective   Patient ID: Lisa Cannon, female    DOB: 1981-09-06  Age: 41 y.o. MRN: [id number]  Chief Complaint  Patient presents with   Weight Check    HPI  F/U weight management -she is doing well overall.  No specific concerns.  She reports that her exercise has fallen off a little bit but she is been much more consistent with getting her protein in regularly.Marland Kitchen   She was having some GI symptoms last month but actually stopped her probiotic and seems to be feeling a lot better bowels are moving a little bit more regularly.  Wanted to check on that dermatology referral.  We had originally sent to this [facility] dermatology in [place] but they actually closed their office at the end of last month.   He did get her mammogram and evaluation and everything checked out normally.      ROS    Objective:     BP 109/70 (BP Location: Left Arm, Patient Position: Sitting, Cuff Size: Normal)   Pulse 93   Temp 98.7 F (37.1 C) (Oral)   Ht '5\' 6"'$  (1.676 m)   Wt 192 lb 1.3 oz (87.1 kg)   SpO2 100%   BMI 31.00 kg/m    Physical Exam Vitals and nursing note reviewed.  Constitutional:      Appearance: She is well-developed.  HENT:     Head: Normocephalic and atraumatic.  Cardiovascular:     Rate and Rhythm: Normal rate and regular rhythm.     Heart sounds: Normal heart sounds.  Pulmonary:     Effort: Pulmonary effort is normal.     Breath sounds: Normal breath sounds.  Skin:    General: Skin is warm and dry.  Neurological:     Mental Status: She is alert and oriented to person, place, and time.  Psychiatric:        Behavior: Behavior normal.      No results found for any visits on 08/07/22.    The 10-year ASCVD risk score (Arnett DK, et al., 2019) is: 0.8%    Assessment & Plan:   Problem List Items Addressed This Visit       Other   Encounter for weight management - Primary    Visit #: 8 Starting Weight: 230 lbs    Current weight:192 lbs Previous weight: 200 lbs Change in weight:  Down 8 lbs  Goal weight: 165-175 lbs Dietary goals: Doing great with regular protein intake.. Eating out about once a week, work on choices at [employer].  Exercise goals: Work on consistency of routine exercise.  Just encouraged her to get back on track with stationary bike 3 days per week.Eliott Nine her to add in some resistance training with bands etc.   Medication: will increase  Ozempic to 2.4 mg weekly. Follow-up and referrals: 8 weeks      Other Visit Diagnoses     Need for influenza vaccination       Relevant Orders   Flu Vaccine QUAD 6+ mos PF IM (Fluarix Quad PF) (Completed)   Skin exam, screening for cancer       Relevant Orders   Ambulatory referral to Dermatology       Return in about 2 months (around 10/07/2022) for weight management .    Beatrice Lecher, MD

## 2022-08-07 NOTE — Assessment & Plan Note (Addendum)
Visit #:8 Starting Weight:230lbs  Current weight:192lbs Previous weight:200lbs Change in weight:Down 8 lbs  Goal weight:165-175 lbs Dietary goals:Doing great with regular protein intake..Eating out about once a week, work on choices at [employer]. Exercise goals:Work on consistency of routine exercise.  Just encouraged her to get back on track with stationary bike 3days per week.Eliott Nine her to add in some resistance training with bands etc.  Medication: will increase Ozempicto 2.'4mg'$  weekly. Follow-up and referrals:8weeks

## 2022-08-10 ENCOUNTER — Other Ambulatory Visit: Payer: Self-pay | Admitting: Neurology

## 2022-08-10 DIAGNOSIS — G43009 Migraine without aura, not intractable, without status migrainosus: Secondary | ICD-10-CM

## 2022-08-14 ENCOUNTER — Other Ambulatory Visit: Payer: Self-pay | Admitting: Neurology

## 2022-08-14 DIAGNOSIS — G43009 Migraine without aura, not intractable, without status migrainosus: Secondary | ICD-10-CM

## 2022-08-15 ENCOUNTER — Other Ambulatory Visit (HOSPITAL_COMMUNITY): Payer: Self-pay

## 2022-08-17 ENCOUNTER — Other Ambulatory Visit (HOSPITAL_COMMUNITY): Payer: Self-pay

## 2022-08-17 ENCOUNTER — Other Ambulatory Visit: Payer: Self-pay | Admitting: Neurology

## 2022-08-17 ENCOUNTER — Other Ambulatory Visit: Payer: Self-pay | Admitting: Family Medicine

## 2022-08-17 DIAGNOSIS — G43009 Migraine without aura, not intractable, without status migrainosus: Secondary | ICD-10-CM

## 2022-08-19 ENCOUNTER — Other Ambulatory Visit (HOSPITAL_COMMUNITY): Payer: Self-pay

## 2022-08-19 DIAGNOSIS — L814 Other melanin hyperpigmentation: Secondary | ICD-10-CM | POA: Diagnosis not present

## 2022-08-19 DIAGNOSIS — D485 Neoplasm of uncertain behavior of skin: Secondary | ICD-10-CM | POA: Diagnosis not present

## 2022-08-19 DIAGNOSIS — L820 Inflamed seborrheic keratosis: Secondary | ICD-10-CM | POA: Diagnosis not present

## 2022-08-19 DIAGNOSIS — D225 Melanocytic nevi of trunk: Secondary | ICD-10-CM | POA: Diagnosis not present

## 2022-08-19 DIAGNOSIS — D2239 Melanocytic nevi of other parts of face: Secondary | ICD-10-CM | POA: Diagnosis not present

## 2022-08-19 DIAGNOSIS — L821 Other seborrheic keratosis: Secondary | ICD-10-CM | POA: Diagnosis not present

## 2022-08-19 MED ORDER — WEGOVY 2.4 MG/0.75ML ~~LOC~~ SOAJ
2.4000 mg | SUBCUTANEOUS | 1 refills | Status: DC
  Filled 2022-08-19: qty 3, 28d supply, fill #0
  Filled 2022-09-11: qty 3, 28d supply, fill #1

## 2022-08-19 NOTE — Patient Instructions (Signed)
Below is our plan:  We will increase Qulipta to '60mg'$  daily and continue sumatriptan. Monitor closely for constipation. Increase water in take. Consider Colace over the counter.   Please make sure you are staying well hydrated. I recommend 50-60 ounces daily. Well balanced diet and regular exercise encouraged. Consistent sleep schedule with 6-8 hours recommended.   Please continue follow up with care team as directed.   Follow up with me in 1 year   You may receive a survey regarding today's visit. I encourage you to leave honest feed back as I do use this information to improve patient care. Thank you for seeing me today!

## 2022-08-19 NOTE — Progress Notes (Unsigned)
No chief complaint on file.   HISTORY OF PRESENT ILLNESS:  08/19/22 ALL:  Lisa Cannon is a 41 y.o. female here today for follow up for migraines. She was last seen by Dr Felecia Shelling 08/2021 and continued on Qulipta '30mg'$  daily and sumatriptan as needed.    HISTORY (copied from Dr Garth Bigness previous note)  Lisa Cannon is a 41 yo woman with chronic migraine headaches.   Update 08/20/2021: She is doing much better on Qulipta.   She has had only 3-4 migraine days a month (2-3 around her cycle and one 1-2 random).   When one occurs, she takes an Aleve and/or sumatriptan.     Prior to [place] she had chronic migraine 25/30 days a month > 4 hours a day.    Headaches are left greater than right associated with a throbbing quality and pressure.  There is often nausea but no vomiting.  She has photophobia and phonophobia.  Moving the head intensifies the pain.  Imitrex will sometimes help short-term but the pain comes back   Her neck pain is doing better as well.   She had Vyepti in February 2021and did well x 7-8 weeks. This was better than any medication tried.  However the additional IV infusions (2 more) had not helped.    She had little benefit with the second or third injections though.   Aimovig and Emgality did not have much benefit.      Botox worsened the pain.    She continues to have pain and pressure in her face and head on a daily basis.   She still takes flexeril 5 mg po tid as it had helped her some.      Topiramate has been tried a couple times but she did not get a benefit and also had side effects.  Propranolol, Effexor, amitriptyline, Corgard, have been tried in the past as well with minimal to no benefit or side effects.   Keppra in the past caused disorientation.      Migraine History: She is a 41 year old woman who has had migraines since age 28.   Initially, headaches were sporadic but they have become more chronic.     Currently, she has 16-20 migraine days a month for 4 or  more hours a day.   She does not have aura.  Migraines are left frontal with a throbbing knifelike quality.   She often has pressure in her eyes and pain in the back of the head.   She sometimes has nausea and no vomiting.    She has photophobia and phonophobia.   During her cycle, HA's are daily and the rest of the month they are intermittent. Moving her head intensifies the pain.    Laying down does not help.     Imitrex will usually help for the rest of the day but then the HA returns.           Medication tried: She was on gabapentin for over a year but had no benefit and actually did better when she stopped.    Topiramate was tried twice with no benefit and some side effects.   A combination of Corgard and Flexeril helped mildly (but only 20-25% reduction in frequency).   She has tried and failed propranolol.   Effexor or amitriptyline did not help and the tricyclic caused side effects.    Aimovig had not helped (did x 1 week only) .   Botox made HA worse.  Emgality did not help.  Vyepti helped the first course but not 2nd or third course so she stopped   REVIEW OF SYSTEMS: Out of a complete 14 system review of symptoms, the patient complains only of the following symptoms, and all other reviewed systems are negative.   ALLERGIES: No Known Allergies   HOME MEDICATIONS: Outpatient Medications Prior to Visit  Medication Sig Dispense Refill   Atogepant (QULIPTA) 30 MG TABS Take 1 tablet by mouth daily. 90 tablet 4   cyclobenzaprine (FLEXERIL) 5 MG tablet Take 1 tablet (5 mg total) by mouth daily as needed for muscle spasms neck pain. 90 tablet 2   fluticasone (FLONASE) 50 MCG/ACT nasal spray Place into both nostrils as needed for allergies or rhinitis.     Multiple Vitamin (MULTIVITAMIN) capsule Take 1 capsule by mouth daily.     nadolol (CORGARD) 20 MG tablet TAKE 3 TABLETS BY MOUTH DAILY 270 tablet 3   naproxen sodium (ALEVE) 220 MG tablet Take 220 mg by mouth daily as needed (for pain).      Probiotic Product (PROBIOTIC PO) Take by mouth.     Semaglutide-Weight Management (WEGOVY) 2.4 MG/0.75ML SOAJ Inject 2.4 mg into the skin every 7 days. 3 mL 1   SUMAtriptan (IMITREX) 100 MG tablet TAKE 1 TABLET BY MOUTH AT ONSET OF HEADACHE. MAY REPEAT DOSE AFTER 2 HOURS. DO NOT TAKE MORE THAN 2 DAYS A WEEK. 30 tablet 3   No facility-administered medications prior to visit.     PAST MEDICAL HISTORY: Past Medical History:  Diagnosis Date   Abnormal Pap smear of cervix    Heartburn    Migraines      PAST SURGICAL HISTORY: Past Surgical History:  Procedure Laterality Date   CERVICAL BIOPSY  W/ LOOP ELECTRODE EXCISION  2002   LAPAROSCOPIC BILATERAL SALPINGECTOMY Bilateral 09/12/2015   Procedure: LAPAROSCOPIC BILATERAL SALPINGECTOMY;  Surgeon: Emily Filbert, MD;  Location: [facility];  Service: Gynecology;  Laterality: Bilateral;   LAPAROSCOPIC LYSIS OF ADHESIONS  09/12/2015   Procedure: LAPAROSCOPIC LYSIS OF ADHESIONS;  Surgeon: Emily Filbert, MD;  Location: [facility];  Service: Gynecology;;   TUBAL LIGATION     WISDOM TOOTH EXTRACTION       FAMILY HISTORY: Family History  Problem Relation Age of Onset   Hyperlipidemia Mother    Diabetes Maternal Aunt    Hypertension Maternal Aunt    Hypertension Maternal Grandfather    Cancer Paternal Grandmother        brain   Cancer Paternal Grandfather        lung cancer   Breast cancer Neg Hx      SOCIAL HISTORY: Social History   Socioeconomic History   Marital status: Married    Spouse name: BJ   Number of children: 1   Years of education: 16   Highest education level: Bachelor's degree (e.g., BA, AB, BS)  Occupational History   Occupation: homemaker  Tobacco Use   Smoking status: Never   Smokeless tobacco: Never  Vaping Use   Vaping Use: Never used  Substance and Sexual Activity   Alcohol use: No    Alcohol/week: 0.0 standard drinks of alcohol   Drug use: No   Sexual activity: Yes    Partners: Male    Birth  control/protection: Surgical  Other Topics Concern   Not on file  Social History Narrative   Lives with husband BJ and daughter [name]    Caffeine use: none   Right handed  Social Determinants of Health   Financial Resource Strain: Not on file  Food Insecurity: Not on file  Transportation Needs: Not on file  Physical Activity: Not on file  Stress: Not on file  Social Connections: Not on file  Intimate Partner Violence: Not on file     PHYSICAL EXAM  There were no vitals filed for this visit. There is no height or weight on file to calculate BMI.  Generalized: Well developed, in no acute distress  Cardiology: normal rate and rhythm, no murmur auscultated  Respiratory: clear to auscultation bilaterally    Neurological examination  Mentation: Alert oriented to time, place, history taking. Follows all commands speech and language fluent Cranial nerve II-XII: Pupils were equal round reactive to light. Extraocular movements were full, visual field were full on confrontational test. Facial sensation and strength were normal. Uvula tongue midline. Head turning and shoulder shrug  were normal and symmetric. Motor: The motor testing reveals 5 over 5 strength of all 4 extremities. Good symmetric motor tone is noted throughout.  Sensory: Sensory testing is intact to soft touch on all 4 extremities. No evidence of extinction is noted.  Coordination: Cerebellar testing reveals good finger-nose-finger and heel-to-shin bilaterally.  Gait and station: Gait is normal. Tandem gait is normal. Romberg is negative. No drift is seen.  Reflexes: Deep tendon reflexes are symmetric and normal bilaterally.    DIAGNOSTIC DATA (LABS, IMAGING, TESTING) - I reviewed patient records, labs, notes, testing and imaging myself where available.  Lab Results  Component Value Date   WBC 7.2 05/14/2022   HGB 14.8 05/14/2022   HCT 44.6 05/14/2022   MCV 85.9 05/14/2022   PLT 297 05/14/2022      Component  Value Date/Time   NA 138 05/14/2022 0545   NA 139 05/29/2020 1434   K 3.9 05/14/2022 0545   CL 103 05/14/2022 0545   CO2 26 05/14/2022 0545   GLUCOSE 112 (H) 05/14/2022 0545   GLUCOSE 82 05/18/2012 1328   BUN 9 05/14/2022 0545   BUN 13 05/29/2020 1434   CREATININE 0.89 05/14/2022 0545   CREATININE 0.86 05/03/2021 0000   CALCIUM 10.1 05/14/2022 0545   PROT 7.7 05/14/2022 0545   PROT 7.6 05/29/2020 1434   ALBUMIN 4.4 05/14/2022 0545   ALBUMIN 4.7 05/29/2020 1434   AST 14 (L) 05/14/2022 0545   ALT 20 05/14/2022 0545   ALKPHOS 62 05/14/2022 0545   BILITOT 0.7 05/14/2022 0545   BILITOT 0.3 05/29/2020 1434   GFRNONAA >60 05/14/2022 0545   GFRNONAA 85 05/03/2021 0000   GFRAA 99 05/03/2021 0000   Lab Results  Component Value Date   CHOL 186 06/11/2022   HDL 44 (L) 06/11/2022   LDLCALC 121 (H) 06/11/2022   TRIG 107 06/11/2022   CHOLHDL 4.2 06/11/2022   Lab Results  Component Value Date   HGBA1C 5.8 (A) 09/03/2021   Lab Results  Component Value Date   DXAJOINO67 672 06/11/2022   Lab Results  Component Value Date   TSH 0.98 06/11/2022        No data to display               No data to display           ASSESSMENT AND PLAN  41 y.o. year old female  has a past medical history of Abnormal Pap smear of cervix, Heartburn, and Migraines. here with    No diagnosis found.  Samyrah R Laver ***.  Healthy lifestyle habits encouraged. ***  will follow up with PCP as directed. *** will return to see me in ***, sooner if needed. *** verbalizes understanding and agreement with this plan.   No orders of the defined types were placed in this encounter.    No orders of the defined types were placed in this encounter.    Debbora Presto, MSN, FNP-C 08/19/2022, 8:11 AM  [facility] [address] [place], [place] [address] [phone]

## 2022-08-20 ENCOUNTER — Other Ambulatory Visit (HOSPITAL_COMMUNITY): Payer: Self-pay

## 2022-08-20 ENCOUNTER — Ambulatory Visit: Payer: 59 | Admitting: Family Medicine

## 2022-08-20 ENCOUNTER — Encounter: Payer: Self-pay | Admitting: Family Medicine

## 2022-08-20 ENCOUNTER — Other Ambulatory Visit: Payer: Self-pay | Admitting: Neurology

## 2022-08-20 VITALS — BP 110/76 | HR 84 | Ht 66.0 in | Wt 194.0 lb

## 2022-08-20 DIAGNOSIS — G43009 Migraine without aura, not intractable, without status migrainosus: Secondary | ICD-10-CM | POA: Diagnosis not present

## 2022-08-20 MED ORDER — QULIPTA 60 MG PO TABS
60.0000 mg | ORAL_TABLET | Freq: Every day | ORAL | 3 refills | Status: DC
  Filled 2022-08-20: qty 90, 90d supply, fill #0
  Filled 2022-11-14: qty 90, 90d supply, fill #1
  Filled 2023-02-12: qty 90, 90d supply, fill #2
  Filled 2023-05-13: qty 90, 90d supply, fill #3

## 2022-08-20 MED ORDER — SUMATRIPTAN SUCCINATE 100 MG PO TABS
ORAL_TABLET | ORAL | 3 refills | Status: DC
  Filled 2022-08-20: qty 27, fill #0
  Filled 2022-09-25: qty 27, 90d supply, fill #0
  Filled 2022-12-24: qty 27, 90d supply, fill #1
  Filled 2023-03-19: qty 27, 90d supply, fill #2
  Filled 2023-06-17: qty 27, 90d supply, fill #3

## 2022-08-21 ENCOUNTER — Other Ambulatory Visit (HOSPITAL_COMMUNITY): Payer: Self-pay

## 2022-08-21 MED ORDER — NADOLOL 20 MG PO TABS
ORAL_TABLET | Freq: Every day | ORAL | 3 refills | Status: DC
  Filled 2022-08-21: qty 270, 90d supply, fill #0
  Filled 2022-11-14: qty 270, 90d supply, fill #1
  Filled 2023-02-12: qty 270, 90d supply, fill #2
  Filled 2023-05-13: qty 270, 90d supply, fill #3

## 2022-09-11 ENCOUNTER — Other Ambulatory Visit (HOSPITAL_COMMUNITY): Payer: Self-pay

## 2022-09-25 ENCOUNTER — Other Ambulatory Visit (HOSPITAL_COMMUNITY): Payer: Self-pay

## 2022-10-04 ENCOUNTER — Other Ambulatory Visit: Payer: Self-pay | Admitting: Neurology

## 2022-10-04 DIAGNOSIS — M542 Cervicalgia: Secondary | ICD-10-CM

## 2022-10-07 ENCOUNTER — Telehealth: Payer: Self-pay | Admitting: Family Medicine

## 2022-10-07 ENCOUNTER — Other Ambulatory Visit (HOSPITAL_COMMUNITY): Payer: Self-pay

## 2022-10-07 ENCOUNTER — Ambulatory Visit: Payer: 59 | Admitting: Family Medicine

## 2022-10-07 ENCOUNTER — Encounter: Payer: Self-pay | Admitting: Family Medicine

## 2022-10-07 VITALS — BP 123/67 | HR 91 | Ht 66.0 in | Wt 193.0 lb

## 2022-10-07 DIAGNOSIS — Z7689 Persons encountering health services in other specified circumstances: Secondary | ICD-10-CM | POA: Diagnosis not present

## 2022-10-07 DIAGNOSIS — L659 Nonscarring hair loss, unspecified: Secondary | ICD-10-CM

## 2022-10-07 MED ORDER — WEGOVY 2.4 MG/0.75ML ~~LOC~~ SOAJ
2.4000 mg | SUBCUTANEOUS | 3 refills | Status: DC
  Filled 2022-10-07: qty 3, 28d supply, fill #0
  Filled 2022-11-06: qty 3, 28d supply, fill #1
  Filled 2022-11-30: qty 3, 28d supply, fill #2
  Filled 2022-12-28: qty 3, 28d supply, fill #3

## 2022-10-07 MED ORDER — CYCLOBENZAPRINE HCL 5 MG PO TABS
5.0000 mg | ORAL_TABLET | Freq: Every day | ORAL | 2 refills | Status: DC | PRN
  Filled 2022-10-07: qty 90, 90d supply, fill #0
  Filled 2023-01-07: qty 90, 90d supply, fill #1
  Filled 2023-04-08: qty 90, 90d supply, fill #2

## 2022-10-07 NOTE — Assessment & Plan Note (Addendum)
Visit #: 9 Starting Weight: 230 lbs   Current weight:193 lbs Previous weight: 192 lbs Change in weight:  up 1 lbs  Goal weight: 165-175 lbs Dietary goals: The eating at home and doing meal prep. Exercise goals: Work on consistency of routine exercise.  Just encouraged her to get back on track with stationary bike 3 days per week..  She has added in some resistance training with bands etc.  discussed adding in some light weights - 2-3 lbs.  Medication: Continue Ozempic to 2.4 mg weekly. Follow-up and referrals: 10-12 weeks

## 2022-10-07 NOTE — Telephone Encounter (Signed)
Needs PA on Ozempic. Current one expired.

## 2022-10-07 NOTE — Progress Notes (Signed)
Pt reports that she is doing well on current regimen of Wegovy 2.4 mg weekly. She stated that she is needing a refill and believes that she was approved thru 10/2022.  Spoke w/Kira and was advised that she was approved for her medication until 11/08/2022.

## 2022-10-07 NOTE — Progress Notes (Signed)
   Established Patient Office Visit  Subjective   Patient ID: Lisa Cannon, female    DOB: January 11, 1981  Age: 41 y.o. MRN: [id number]  Chief Complaint  Patient presents with   Weight Check    HPI  Pt reports that she is doing well on current regimen of Wegovy 2.4 mg weekly. She stated that she is needing a refill and believes that she was approved thru 10/2022.  She has been doing well on the medication.  Just some occasional stomach upset.  She does feel like it is wearing off towards the end of the dosing as she will notice a slight bump up in hunger and appetite.  She has been adding in a stretching routine and using some bands.  Spoke w/Kira and was advised that she was approved for her medication until 11/08/2022.  He has had the COVID-vaccine.  Abstracted into her chart.    ROS    Objective:     BP 123/67   Pulse 91   Ht '5\' 6"'$  (1.676 m)   Wt 193 lb (87.5 kg)   LMP 09/23/2022 (Exact Date)   SpO2 100%   BMI 31.15 kg/m    Physical Exam Vitals and nursing note reviewed.  Constitutional:      Appearance: She is well-developed.  HENT:     Head: Normocephalic and atraumatic.  Cardiovascular:     Rate and Rhythm: Normal rate and regular rhythm.     Heart sounds: Normal heart sounds.  Pulmonary:     Effort: Pulmonary effort is normal.  Skin:    General: Skin is warm and dry.  Neurological:     Mental Status: She is alert and oriented to person, place, and time.  Psychiatric:        Behavior: Behavior normal.      No results found for any visits on 10/07/22.    The 10-year ASCVD risk score (Arnett DK, et al., 2019) is: 1%    Assessment & Plan:   Problem List Items Addressed This Visit       Other   Hair loss    Noticing significant diffuse hair loss.  Asked about taking a supplement such as Nutrafol.  I think it would be reasonable to try it is mostly a supplement.  It could be from the GLP-1.  I do think it would be temporary if it is from the  medication.  Also consider topical Rogaine for women.  Percent of women do experience hair loss on GLP-1's.      Encounter for weight management - Primary     Visit #: 9 Starting Weight: 230 lbs   Current weight:193 lbs Previous weight: 192 lbs Change in weight:  up 1 lbs  Goal weight: 165-175 lbs Dietary goals: The eating at home and doing meal prep. Exercise goals: Work on consistency of routine exercise.  Just encouraged her to get back on track with stationary bike 3 days per week..  She has added in some resistance training with bands etc.  discussed adding in some light weights - 2-3 lbs.  Medication: Continue Ozempic to 2.4 mg weekly. Follow-up and referrals: 10-12 weeks       Return in about 11 weeks (around 12/23/2022) for Medication management .    Beatrice Lecher, MD

## 2022-10-07 NOTE — Assessment & Plan Note (Signed)
Noticing significant diffuse hair loss.  Asked about taking a supplement such as Nutrafol.  I think it would be reasonable to try it is mostly a supplement.  It could be from the GLP-1.  I do think it would be temporary if it is from the medication.  Also consider topical Rogaine for women.  Percent of women do experience hair loss on GLP-1's.

## 2022-10-08 ENCOUNTER — Telehealth: Payer: Self-pay

## 2022-10-08 NOTE — Telephone Encounter (Signed)
Initiated Prior authorization OIL:[id number] 2.'4MG'$ /0.75ML auto-injectors Via: Covermymeds Case/Key:[id number] Status: approved as of 10/08/22 Reason:The request has been approved. The authorization is effective from 10/08/2022 to 10/08/2023, as long as the member is enrolled in their current health plan. The request was approved as submitted. This request has been approved for 10m per 28 days.A second prior authorization [id number] has been entered [facility]25[facility]'$ /0.564m this request is effective from 10/08/2022 and is valid until 10/08/2023 and is limited to 20m39mer 28 days.A third prior authorization 20635 has been entered for [facility]5mg'$ /0.5mL20mhis request is effective from 10/08/2022 and is valid until 10/08/2023 and is limited to 20mL 37m 28 days.A fourth prior authorization 20636 has been entered for [facility]'$ /0.5mL, 11ms request is effective from 10/08/2022 and is valid until 10/08/2023 and is limited to 20mL pe69m8 days.A fifth prior authorization 20637 has been entered for Wegovy [facility]'7mg'$ /0.75mL, t30mrequest is effective from 10/08/2022 and is valid until 10/08/2023 and is limited to 3mL per 23mdays. Notified Pt via: Mychart

## 2022-11-06 ENCOUNTER — Other Ambulatory Visit (HOSPITAL_COMMUNITY): Payer: Self-pay

## 2022-11-14 ENCOUNTER — Other Ambulatory Visit: Payer: Self-pay

## 2022-11-15 ENCOUNTER — Other Ambulatory Visit: Payer: Self-pay

## 2022-11-30 ENCOUNTER — Other Ambulatory Visit (HOSPITAL_COMMUNITY): Payer: Self-pay

## 2022-12-23 ENCOUNTER — Ambulatory Visit: Payer: 59 | Admitting: Family Medicine

## 2022-12-24 ENCOUNTER — Other Ambulatory Visit (HOSPITAL_COMMUNITY): Payer: Self-pay

## 2022-12-27 ENCOUNTER — Other Ambulatory Visit (HOSPITAL_BASED_OUTPATIENT_CLINIC_OR_DEPARTMENT_OTHER): Payer: Self-pay

## 2022-12-28 ENCOUNTER — Other Ambulatory Visit: Payer: Self-pay

## 2023-01-01 ENCOUNTER — Other Ambulatory Visit (HOSPITAL_COMMUNITY): Payer: Self-pay

## 2023-01-06 ENCOUNTER — Ambulatory Visit: Payer: Commercial Managed Care - PPO | Admitting: Family Medicine

## 2023-01-06 ENCOUNTER — Encounter: Payer: Self-pay | Admitting: Family Medicine

## 2023-01-06 VITALS — BP 117/72 | HR 91 | Ht 66.0 in | Wt 184.0 lb

## 2023-01-06 DIAGNOSIS — Z7689 Persons encountering health services in other specified circumstances: Secondary | ICD-10-CM | POA: Diagnosis not present

## 2023-01-06 DIAGNOSIS — G43009 Migraine without aura, not intractable, without status migrainosus: Secondary | ICD-10-CM | POA: Diagnosis not present

## 2023-01-06 NOTE — Assessment & Plan Note (Addendum)
Visit #: 10 Starting Weight: 230 lbs   Current weight:184 lbs Previous weight: 193 lbs Change in weight:  down  lbs  Goal weight: 165-175 lbs Dietary goals: The eating at home and doing meal prep. Exercise goals: Work on consistency of routine exercise.   start back with  some resistance training with bands etc.  Discussed adding in some light weights - 2-3 lbs.  Medication: Continue Weogvy to 2.4 mg weekly. Follow-up and referrals: 10-12 weeks

## 2023-01-06 NOTE — Progress Notes (Signed)
   Established Patient Office Visit  Subjective   Patient ID: Lisa Cannon, female    DOB: November 20, 1981  Age: 42 y.o. 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  Still tolerating the [facility] well at 2.4 mg she has adjusted her eating schedule so she eats her evening meal around 4:00 she says that actually seems to have helped with some stomach issues she was having with the [facility].    ROS    Objective:     BP 117/72   Pulse 91   Ht '5\' 6"'$  (1.676 m)   Wt 184 lb (83.5 kg)   SpO2 100%   BMI 29.70 kg/m    Physical Exam Vitals and nursing note reviewed.  Constitutional:      Appearance: She is well-developed.  HENT:     Head: Normocephalic and atraumatic.  Cardiovascular:     Rate and Rhythm: Normal rate and regular rhythm.     Heart sounds: Normal heart sounds.  Pulmonary:     Effort: Pulmonary effort is normal.     Breath sounds: Normal breath sounds.  Skin:    General: Skin is warm and dry.  Neurological:     Mental Status: She is alert and oriented to person, place, and time.  Psychiatric:        Behavior: Behavior normal.      No results found for any visits on 01/06/23.    The 10-year ASCVD risk score (Arnett DK, et al., 2019) is: 0.9%    Assessment & Plan:   Problem List Items Addressed This Visit       Cardiovascular and Mediastinum   Migraine without aura and without status migrainosus, not intractable    Oertli on Qulipta still working with neurology.  Headaches are better controlled and less intense.        Other   Encounter for weight management - Primary    Visit #:  10 Starting Weight: 230 lbs   Current weight:184 lbs Previous weight: 193 lbs Change in weight:  down  lbs  Goal weight: 165-175 lbs Dietary goals: The eating at home and doing meal prep. Exercise goals: Work on consistency of routine exercise.   start back with  some resistance training with bands etc.  Discussed adding in some light weights - 2-3 lbs.  Medication: Continue Weogvy to 2.4 mg weekly. Follow-up and referrals: 10-12 weeks       Return in about 2 months (around 03/07/2023) for weight management .    Beatrice Lecher, MD

## 2023-01-06 NOTE — Assessment & Plan Note (Signed)
Oertli on [place] still working with neurology.  Headaches are better controlled and less intense.

## 2023-01-07 ENCOUNTER — Other Ambulatory Visit (HOSPITAL_COMMUNITY): Payer: Self-pay

## 2023-01-23 ENCOUNTER — Other Ambulatory Visit (HOSPITAL_COMMUNITY)
Admission: RE | Admit: 2023-01-23 | Discharge: 2023-01-23 | Disposition: A | Discharge status: Home / Self Care | Payer: Commercial Managed Care - PPO | Source: Ambulatory Visit | Attending: Obstetrics and Gynecology | Admitting: Obstetrics and Gynecology

## 2023-01-23 ENCOUNTER — Encounter: Payer: Self-pay | Admitting: Obstetrics and Gynecology

## 2023-01-23 ENCOUNTER — Ambulatory Visit (INDEPENDENT_AMBULATORY_CARE_PROVIDER_SITE_OTHER): Payer: Commercial Managed Care - PPO | Admitting: Obstetrics and Gynecology

## 2023-01-23 VITALS — BP 112/78 | HR 88 | Ht 66.0 in | Wt 186.0 lb

## 2023-01-23 DIAGNOSIS — Z124 Encounter for screening for malignant neoplasm of cervix: Secondary | ICD-10-CM | POA: Insufficient documentation

## 2023-01-23 DIAGNOSIS — Z01419 Encounter for gynecological examination (general) (routine) without abnormal findings: Secondary | ICD-10-CM | POA: Insufficient documentation

## 2023-01-23 DIAGNOSIS — Z1231 Encounter for screening mammogram for malignant neoplasm of breast: Secondary | ICD-10-CM

## 2023-01-23 NOTE — Patient Instructions (Signed)
You will see your pap results in MyChart within 1-2 weeks

## 2023-01-23 NOTE — Progress Notes (Signed)
   ANNUAL EXAM Patient name: Lisa Cannon MRN [id number]  Date of birth: 02/24/1981 Chief Complaint:   Annual Exam  History of Present Illness:   Lisa Cannon is a 42 y.o. G1P1001 with Patient's last menstrual period was 01/14/2023. being seen today for a routine annual exam.  Current complaints: None   The pregnancy intention screening data noted above was reviewed. Potential methods of contraception were discussed. The patient elected to proceed with Female Sterilization.   Last pap NILM/HPV neg 12/28/19. H/O abnormal pap: yes - NILM/HPV+ 12/10/17 Last mammogram: 06/25/22. Results were: normal. Family h/o breast cancer: no Last colonoscopy: n/a. Results were: N/A. Family h/o colorectal cancer: no     01/06/2023    9:27 AM 10/07/2022    9:05 AM 08/07/2022    9:20 AM 03/20/2022    9:04 AM 12/26/2021    9:12 AM  Depression screen PHQ 2/9  Decreased Interest 0 0 0 0 0  Down, Depressed, Hopeless 0 0 0 0 0  PHQ - 2 Score 0 0 0 0 0        05/09/2020    8:50 AM 04/02/2018    9:33 AM  GAD 7 : Generalized Anxiety Score  Nervous, Anxious, on Edge 0 0  Control/stop worrying 0 0  Worry too much - different things 0 0  Trouble relaxing 0 0  Restless 0 0  Easily annoyed or irritable 0 0  Afraid - awful might happen 0 0  Total GAD 7 Score 0 0  Anxiety Difficulty Not difficult at all Not difficult at all     Review of Systems:   Pertinent items are noted in HPI Denies any headaches, blurred vision, fatigue, shortness of breath, chest pain, abdominal pain, abnormal vaginal discharge/itching/odor/irritation, problems with periods, bowel movements, urination, or intercourse unless otherwise stated above. Pertinent History Reviewed:  Reviewed past medical,surgical, social and family history.  Reviewed problem list, medications and allergies. Physical Assessment:   Vitals:   01/23/23 0844  BP: 112/78  Pulse: 88  Weight: 186 lb (84.4 kg)  Height: 5' 6"$  (1.676 m)  Body mass index is 30.02  kg/m.        Physical Examination:   General appearance - well appearing, and in no distress  Mental status - alert, oriented to person, place, and time  Chest - respiratory effort normal  Heart - normal peripheral perfusion  Breasts - breasts appear normal, no suspicious masses, no skin or nipple changes or axillary nodes  Abdomen - soft, nontender, nondistended, no masses or organomegaly  Pelvic - VULVA: normal appearing vulva with no masses, tenderness or lesions  VAGINA: normal appearing vagina with normal color and discharge, no lesions  CERVIX: normal appearing cervix without discharge or lesions, no CMT  Thin prep pap is done with HR HPV cotesting  UTERUS: uterus is felt to be normal size, shape, consistency and nontender   ADNEXA: No adnexal masses or tenderness noted.  Chaperone present for exam  No results found for this or any previous visit (from the past 24 hour(s)).  Assessment & Plan:  1) Well-Woman Exam Mammogram: due 06/2023, ordered today for convenience Colonoscopy: @ 42yo, or sooner if problems Pap: Collected today  Labs/procedures today:  -     Cytology - PAP( [place]) -     MM Digital Screening; Future  Follow-up: Return in about 1 year (around 01/24/2024) for annual exam.  [name], MD 01/23/2023 9:23 AM

## 2023-01-24 LAB — CYTOLOGY - PAP
Comment: NEGATIVE
Diagnosis: NEGATIVE
High risk HPV: NEGATIVE

## 2023-01-27 ENCOUNTER — Encounter: Payer: Self-pay | Admitting: Obstetrics and Gynecology

## 2023-01-27 DIAGNOSIS — R87810 Cervical high risk human papillomavirus (HPV) DNA test positive: Secondary | ICD-10-CM | POA: Insufficient documentation

## 2023-01-29 ENCOUNTER — Other Ambulatory Visit: Payer: Self-pay | Admitting: Family Medicine

## 2023-01-29 ENCOUNTER — Other Ambulatory Visit: Payer: Self-pay

## 2023-01-29 MED ORDER — WEGOVY 2.4 MG/0.75ML ~~LOC~~ SOAJ
2.4000 mg | SUBCUTANEOUS | 3 refills | Status: DC
  Filled 2023-01-29: qty 3, 28d supply, fill #0
  Filled 2023-02-20: qty 3, 28d supply, fill #1
  Filled 2023-03-22: qty 3, 28d supply, fill #2

## 2023-02-12 ENCOUNTER — Other Ambulatory Visit: Payer: Self-pay

## 2023-02-12 ENCOUNTER — Other Ambulatory Visit (HOSPITAL_COMMUNITY): Payer: Self-pay

## 2023-02-21 ENCOUNTER — Other Ambulatory Visit: Payer: Self-pay

## 2023-03-17 ENCOUNTER — Ambulatory Visit: Payer: Commercial Managed Care - PPO | Admitting: Family Medicine

## 2023-03-17 VITALS — BP 126/80 | HR 83 | Ht 66.0 in | Wt 188.0 lb

## 2023-03-17 DIAGNOSIS — Z7689 Persons encountering health services in other specified circumstances: Secondary | ICD-10-CM

## 2023-03-17 DIAGNOSIS — Z713 Dietary counseling and surveillance: Secondary | ICD-10-CM

## 2023-03-17 NOTE — Assessment & Plan Note (Signed)
Visit #: 11 Starting Weight: 230 lbs   Current weight:188 lbs Previous weight: 184 lbs Change in weight:  up 4  lbs  Goal weight: 165-175 lbs Dietary goals: Continue eating at home and doing meal prep. Exercise goals: Work on consistency of routine exercise.   start back with some resistance training with bands etc.  Discussed adding in some light weights - 2-3 lbs.  Medication: Continue Weogvy to 2.4 mg weekly.  We discussed that after she comes off the [facility] in early June organ to switch to phentermine over the summer.  She will need to come in a little bit more frequently to make sure blood pressure and pulse etc. are doing well.  Will start with phentermine 15 mg that first month and then we can adjust from there if needed I really like to get her closer to her goal of 170 if at all possible and then switch to Contrave which should be covered under her current insurance plan for maintenance going forward. Follow-up and referrals: 10-12 weeks

## 2023-03-17 NOTE — Progress Notes (Signed)
   Established Patient Office Visit  Subjective   Patient ID: Lisa Cannon, female    DOB: 02-19-1981  Age: 42 y.o. MRN: [id number]  Chief Complaint  Patient presents with   Weight Management Screening    HPI  Follow-up weight management-she is really continued to do well with the semaglutide.  Currently on 2.4 mg.  Insurance will no longer cover it after the end of this month.  So she will have enough after she feels it to get her until about early June.  And then wanted to discuss a plan for moving forward after that.  She feels like she has been stuck in the mid to upper 180s and would really still like to get down to about 170 pounds.    ROS    Objective:     BP 126/80   Pulse 83   Ht 5\' 6"  (1.676 m)   Wt 188 lb (85.3 kg)   SpO2 99%   BMI 30.34 kg/m    Physical Exam Vitals and nursing note reviewed.  Constitutional:      Appearance: She is well-developed.  HENT:     Head: Normocephalic and atraumatic.  Cardiovascular:     Rate and Rhythm: Normal rate and regular rhythm.     Heart sounds: Normal heart sounds.  Pulmonary:     Effort: Pulmonary effort is normal.     Breath sounds: Normal breath sounds.  Skin:    General: Skin is warm and dry.  Neurological:     Mental Status: She is alert and oriented to person, place, and time.  Psychiatric:        Behavior: Behavior normal.      No results found for any visits on 03/17/23.    The 10-year ASCVD risk score (Arnett DK, et al., 2019) is: 1%    Assessment & Plan:   Problem List Items Addressed This Visit       Other   Encounter for weight management - Primary    Visit #: 11 Starting Weight: 230 lbs   Current weight:188 lbs Previous weight: 184 lbs Change in weight:  up 4  lbs  Goal weight: 165-175 lbs Dietary goals: Continue eating at home and doing meal prep. Exercise goals: Work on consistency of routine exercise.   start back with some resistance training with bands etc.  Discussed adding  in some light weights - 2-3 lbs.  Medication: Continue Weogvy to 2.4 mg weekly.  We discussed that after she comes off the [facility] in early June organ to switch to phentermine over the summer.  She will need to come in a little bit more frequently to make sure blood pressure and pulse etc. are doing well.  Will start with phentermine 15 mg that first month and then we can adjust from there if needed I really like to get her closer to her goal of 170 if at all possible and then switch to Contrave which should be covered under her current insurance plan for maintenance going forward. Follow-up and referrals: 10-12 weeks       Return for Follow-up about 3 to 4 weeks after starting new medications so likely end of June early July.Nani Gasser, MD

## 2023-03-19 ENCOUNTER — Other Ambulatory Visit: Payer: Self-pay

## 2023-03-22 ENCOUNTER — Other Ambulatory Visit (HOSPITAL_COMMUNITY): Payer: Self-pay

## 2023-04-09 ENCOUNTER — Other Ambulatory Visit (HOSPITAL_COMMUNITY): Payer: Self-pay

## 2023-04-27 ENCOUNTER — Other Ambulatory Visit (HOSPITAL_COMMUNITY): Payer: Self-pay

## 2023-04-27 ENCOUNTER — Telehealth: Payer: Self-pay

## 2023-04-27 NOTE — Telephone Encounter (Signed)
Patient Advocate Encounter   Received notification from [employer] that prior authorization is required for Qulipta 30MG  tablets   Submitted: 04-27-2023 Key [id number]  Status is pending

## 2023-05-01 ENCOUNTER — Other Ambulatory Visit (HOSPITAL_COMMUNITY): Payer: Self-pay

## 2023-05-01 NOTE — Telephone Encounter (Signed)
Pharmacy Patient Advocate Encounter  Prior Authorization for Qulipta 30MG  tablets has been APPROVED by [facility] from 04/27/2023 to 04/25/2024.   PA # PA Case ID #: [id number]  Copay is $0 per [facility] test claim.

## 2023-05-14 ENCOUNTER — Other Ambulatory Visit: Payer: Self-pay

## 2023-05-15 ENCOUNTER — Other Ambulatory Visit (HOSPITAL_COMMUNITY): Payer: Self-pay

## 2023-05-15 ENCOUNTER — Encounter (INDEPENDENT_AMBULATORY_CARE_PROVIDER_SITE_OTHER): Payer: Commercial Managed Care - PPO | Admitting: Family Medicine

## 2023-05-15 DIAGNOSIS — Z7689 Persons encountering health services in other specified circumstances: Secondary | ICD-10-CM

## 2023-05-15 DIAGNOSIS — Z713 Dietary counseling and surveillance: Secondary | ICD-10-CM | POA: Diagnosis not present

## 2023-05-15 MED ORDER — PHENTERMINE HCL 37.5 MG PO TABS
18.7500 mg | ORAL_TABLET | Freq: Every day | ORAL | 0 refills | Status: DC
Start: 2023-05-15 — End: 2023-06-16
  Filled 2023-05-15: qty 15, 30d supply, fill #0

## 2023-05-15 NOTE — Telephone Encounter (Signed)
I spent 5 total minutes of online digital evaluation and management services in this patient-initiated request for online care. 

## 2023-05-16 ENCOUNTER — Other Ambulatory Visit: Payer: Self-pay

## 2023-05-16 ENCOUNTER — Other Ambulatory Visit (HOSPITAL_COMMUNITY): Payer: Self-pay

## 2023-06-16 ENCOUNTER — Other Ambulatory Visit: Payer: Self-pay

## 2023-06-16 ENCOUNTER — Ambulatory Visit (INDEPENDENT_AMBULATORY_CARE_PROVIDER_SITE_OTHER): Payer: Commercial Managed Care - PPO | Admitting: Family Medicine

## 2023-06-16 ENCOUNTER — Other Ambulatory Visit (HOSPITAL_COMMUNITY): Payer: Self-pay

## 2023-06-16 ENCOUNTER — Encounter: Payer: Self-pay | Admitting: Family Medicine

## 2023-06-16 VITALS — BP 126/73 | HR 89 | Ht 66.0 in | Wt 192.0 lb

## 2023-06-16 DIAGNOSIS — H6122 Impacted cerumen, left ear: Secondary | ICD-10-CM | POA: Diagnosis not present

## 2023-06-16 DIAGNOSIS — Z7689 Persons encountering health services in other specified circumstances: Secondary | ICD-10-CM

## 2023-06-16 DIAGNOSIS — R7989 Other specified abnormal findings of blood chemistry: Secondary | ICD-10-CM | POA: Diagnosis not present

## 2023-06-16 DIAGNOSIS — E559 Vitamin D deficiency, unspecified: Secondary | ICD-10-CM | POA: Diagnosis not present

## 2023-06-16 DIAGNOSIS — Z0000 Encounter for general adult medical examination without abnormal findings: Secondary | ICD-10-CM | POA: Diagnosis not present

## 2023-06-16 MED ORDER — PHENTERMINE HCL 37.5 MG PO TABS
37.5000 mg | ORAL_TABLET | Freq: Every day | ORAL | 1 refills | Status: DC
Start: 2023-06-16 — End: 2023-08-19
  Filled 2023-06-16: qty 30, 30d supply, fill #0
  Filled 2023-08-13: qty 30, 30d supply, fill #1

## 2023-06-16 NOTE — Progress Notes (Signed)
Complete physical exam  Patient: Lisa Cannon   DOB: March 01, 1981   42 y.o. Female  MRN: [id number]  Subjective:    Chief Complaint  Patient presents with   Annual Exam    Lisa Cannon is a 42 y.o. female who presents today for a complete physical exam. She reports consuming a general diet.  Still regular exercise   She generally feels well. She reports sleeping fairly well. She does not have additional problems to discuss today.   Sees derm for skin check.     Most recent fall risk assessment:    01/06/2023    9:27 AM  Fall Risk   Falls in the past year? 0  Number falls in past yr: 0  Injury with Fall? 0  Risk for fall due to : No Fall Risks  Follow up Falls prevention discussed     Most recent depression screenings:    06/16/2023    1:16 PM 01/06/2023    9:27 AM  PHQ 2/9 Scores  PHQ - 2 Score 0 0        Patient Care Team: Agapito Games, MD as PCP - General (Family Medicine) Allie Bossier, MD as Consulting Physician (Obstetrics and Gynecology)   Outpatient Medications Prior to Visit  Medication Sig   Atogepant (QULIPTA) 60 MG TABS Take 1 tablet (60 mg total) by mouth daily.   cyclobenzaprine (FLEXERIL) 5 MG tablet Take 1 tablet (5 mg total) by mouth daily as needed for muscle spasms neck pain.   fluticasone (FLONASE) 50 MCG/ACT nasal spray Place into both nostrils as needed for allergies or rhinitis.   Multiple Vitamin (MULTIVITAMIN) capsule Take 1 capsule by mouth daily.   nadolol (CORGARD) 20 MG tablet TAKE 3 TABLETS BY MOUTH DAILY   naproxen sodium (ALEVE) 220 MG tablet Take 220 mg by mouth daily as needed (for pain).   Probiotic Product (PROBIOTIC PO) Take by mouth.   SUMAtriptan (IMITREX) 100 MG tablet TAKE 1 TABLET BY MOUTH AT ONSET OF HEADACHE. MAY REPEAT DOSE AFTER 2 HOURS. DO NOT TAKE MORE THAN 2 DAYS A WEEK.   [DISCONTINUED] phentermine (ADIPEX-P) 37.5 MG tablet Take 0.5 tablets (18.75 mg total) by mouth daily before breakfast.    [DISCONTINUED] Semaglutide-Weight Management (WEGOVY) 2.4 MG/0.75ML SOAJ Inject 2.4 mg into the skin every 7 (seven) days.   No facility-administered medications prior to visit.    ROS        Objective:     BP 126/73   Pulse 89   Ht 5\' 6"  (1.676 m)   Wt 192 lb (87.1 kg)   SpO2 100%   BMI 30.99 kg/m    Physical Exam Vitals and nursing note reviewed.  Constitutional:      Appearance: She is well-developed.  HENT:     Head: Normocephalic and atraumatic.     Right Ear: Tympanic membrane, ear canal and external ear normal.     Left Ear: Ear canal and external ear normal.     Ears:     Comments: Left TM blocked by cerumen.     Nose: Nose normal.  Eyes:     Conjunctiva/sclera: Conjunctivae normal.     Pupils: Pupils are equal, round, and reactive to light.  Neck:     Thyroid: No thyromegaly.  Cardiovascular:     Rate and Rhythm: Normal rate and regular rhythm.     Heart sounds: Normal heart sounds.  Pulmonary:     Effort: Pulmonary effort is normal.  Breath sounds: Normal breath sounds. No wheezing.  Abdominal:     General: Bowel sounds are normal.     Palpations: Abdomen is soft.     Tenderness: There is no abdominal tenderness.  Musculoskeletal:     Cervical back: Neck supple.  Lymphadenopathy:     Cervical: No cervical adenopathy.  Skin:    General: Skin is warm and dry.     Coloration: Skin is not pale.  Neurological:     Mental Status: She is alert and oriented to person, place, and time.  Psychiatric:        Mood and Affect: Mood normal.        Behavior: Behavior normal.      No results found for any visits on 06/16/23.     Assessment & Plan:    Routine Health Maintenance and Physical Exam  Immunization History  Administered Date(s) Administered   COVID-19, mRNA, vaccine(Comirnaty)12 years and older 08/27/2022   Influenza Split 09/17/2011, 08/16/2012   Influenza Whole 09/11/2009   Influenza,inj,Quad PF,6+ Mos 08/30/2015, 07/22/2017,  08/05/2018, 08/10/2019, 08/07/2022   Influenza-Unspecified 09/22/2014, 08/27/2016, 08/28/2021   PFIZER(Purple Top)SARS-COV-2 Vaccination 02/07/2020, 03/09/2020, 08/28/2020, 09/16/2020, 04/20/2021   Pfizer Covid-19 Vaccine Bivalent Booster 5y-11y 08/28/2021   Td 11/21/2009   Tdap 03/20/2012, 06/11/2022    Health Maintenance  Topic Date Due   INFLUENZA VACCINE  07/03/2023   PAP SMEAR-Modifier  01/24/2028   DTaP/Tdap/Td (4 - Td or Tdap) 06/11/2032   COVID-19 Vaccine  Completed   Hepatitis C Screening  Completed   HIV Screening  Completed   Pneumococcal Vaccine 44-12 Years old  Aged Out   HPV VACCINES  Aged Out    Discussed health benefits of physical activity, and encouraged her to engage in regular exercise appropriate for her age and condition.  Problem List Items Addressed This Visit       Other   Encounter for weight management   Relevant Medications   phentermine (ADIPEX-P) 37.5 MG tablet   Other Visit Diagnoses     Wellness examination    -  Primary   Relevant Orders   Lipid Panel w/reflex Direct LDL   Vitamin D (25 hydroxy)   COMPLETE METABOLIC PANEL WITH GFR   Low vitamin D level       Relevant Orders   Vitamin D (25 hydroxy)   Hearing loss due to cerumen impaction, left           Keep up a regular exercise program and make sure you are eating a healthy diet Try to eat 4 servings of dairy a day, or if you are lactose intolerant take a calcium with vitamin D daily.  Your vaccines are up to date.   Return in about 2 months (around 08/17/2023) for weight Mgt.  Indication: Cerumen impaction of the ear(s) Medical necessity statement: On physical examination, cerumen impairs clinically significant portions of the external auditory canal, and tympanic membrane. Noted obstructive, copious cerumen that cannot be removed without magnification and instrumentations  Consent: Discussed benefits and risks of procedure and verbal consent obtained Procedure: Patient was  prepped for the procedure. Utilized an otoscope to assess and take note of the ear canal, the tympanic membrane, and the presence, amount, and placement of the cerumen. Gentle water irrigation and soft plastic curette was utilized to remove cerumen.  Post procedure examination: shows cerumen was completely removed. Patient tolerated procedure well. The patient is made aware that they may experience temporary vertigo, temporary hearing loss, and temporary discomfort. If  these symptom last for more than 24 hours to call the clinic or proceed to the ED.      Nani Gasser, MD

## 2023-06-17 ENCOUNTER — Other Ambulatory Visit: Payer: Self-pay

## 2023-06-17 LAB — COMPLETE METABOLIC PANEL WITH GFR
AG Ratio: 1.6 (calc) (ref 1.0–2.5)
ALT: 22 U/L (ref 6–29)
AST: 14 U/L (ref 10–30)
Albumin: 4.2 g/dL (ref 3.6–5.1)
Alkaline phosphatase (APISO): 57 U/L (ref 31–125)
BUN: 9 mg/dL (ref 7–25)
CO2: 26 mmol/L (ref 20–32)
Calcium: 9.5 mg/dL (ref 8.6–10.2)
Chloride: 103 mmol/L (ref 98–110)
Creat: 0.62 mg/dL (ref 0.50–0.99)
Globulin: 2.7 g/dL (calc) (ref 1.9–3.7)
Glucose, Bld: 73 mg/dL (ref 65–99)
Potassium: 4.1 mmol/L (ref 3.5–5.3)
Sodium: 139 mmol/L (ref 135–146)
Total Bilirubin: 0.7 mg/dL (ref 0.2–1.2)
Total Protein: 6.9 g/dL (ref 6.1–8.1)
eGFR: 115 mL/min/{1.73_m2} (ref >=60)

## 2023-06-17 LAB — LIPID PANEL W/REFLEX DIRECT LDL
Cholesterol: 174 mg/dL (ref <200)
HDL: 53 mg/dL (ref >=50)
LDL Cholesterol (Calc): 103 mg/dL (calc) — ABNORMAL HIGH
Non-HDL Cholesterol (Calc): 121 mg/dL (calc) (ref <130)
Total CHOL/HDL Ratio: 3.3 (calc) (ref <5.0)
Triglycerides: 90 mg/dL (ref <150)

## 2023-06-17 LAB — VITAMIN D 25 HYDROXY (VIT D DEFICIENCY, FRACTURES): Vit D, 25-Hydroxy: 32 ng/mL (ref 30–100)

## 2023-06-17 NOTE — Progress Notes (Signed)
Lisa Cannon, your LDL looks great!  Goal is under 100 and yours was 103.  But it looks the best that it has looked in the last 3 years which is great.  Plus your good cholesterol is greater than 50 which is great.  That is the first time I have seen your numbers shift in a positive direction.  That is actually cardioprotective.  Vitamin D also in the normal range which looks great it still on the low end so just continue with daily vitamin D supplementation around 25 mcg daily.  Your metabolic panel looks great.

## 2023-07-14 ENCOUNTER — Other Ambulatory Visit: Payer: Self-pay | Admitting: Neurology

## 2023-07-14 DIAGNOSIS — M542 Cervicalgia: Secondary | ICD-10-CM

## 2023-07-15 ENCOUNTER — Other Ambulatory Visit (HOSPITAL_COMMUNITY): Payer: Self-pay

## 2023-07-15 MED ORDER — CYCLOBENZAPRINE HCL 5 MG PO TABS
5.0000 mg | ORAL_TABLET | Freq: Every day | ORAL | 0 refills | Status: DC | PRN
Start: 2023-07-15 — End: 2023-12-23
  Filled 2023-07-15: qty 90, 90d supply, fill #0

## 2023-07-15 NOTE — Telephone Encounter (Signed)
Last seen on 08/20/22 Follow up scheduled on 10/07/23

## 2023-07-16 ENCOUNTER — Ambulatory Visit: Payer: Commercial Managed Care - PPO

## 2023-08-05 ENCOUNTER — Other Ambulatory Visit: Payer: Self-pay | Admitting: Neurology

## 2023-08-05 ENCOUNTER — Other Ambulatory Visit (HOSPITAL_COMMUNITY): Payer: Self-pay

## 2023-08-05 DIAGNOSIS — G43009 Migraine without aura, not intractable, without status migrainosus: Secondary | ICD-10-CM

## 2023-08-05 MED ORDER — NADOLOL 20 MG PO TABS
60.0000 mg | ORAL_TABLET | Freq: Every day | ORAL | 3 refills | Status: DC
Start: 2023-08-05 — End: 2024-07-27
  Filled 2023-08-05: qty 270, 90d supply, fill #0
  Filled 2023-10-30: qty 270, 90d supply, fill #1
  Filled 2024-01-28: qty 270, 90d supply, fill #2
  Filled 2024-04-27: qty 270, 90d supply, fill #3

## 2023-08-05 NOTE — Telephone Encounter (Signed)
Last seen on 08/20/22 per note " A combination of Corgard and Flexeril helped mildly (but only 20-25% reduction in frequency). " Follow up scheduled on 10/07/23 Last filled on 05/22/23 #270 (90 day supply)   Was patient going to continue Rx?

## 2023-08-12 ENCOUNTER — Telehealth: Payer: Self-pay | Admitting: Family Medicine

## 2023-08-12 NOTE — Telephone Encounter (Signed)
Pt said, pt having new symptoms; dizziness, motion sickness gave worsened since May. Would like a call from the nurse to discuss an appt with Dr. Epimenio Foot.  Informed pt having new symptoms requires a referral from PCP.

## 2023-08-12 NOTE — Telephone Encounter (Signed)
I called pt to get more information.  Pt stating qulipta not working as well as used to. She wanted to see Dr. Epimenio Foot.  I relayed that NP assist MD's with there pts.  If same issue then to see AL/NP.  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  Has seen pcp and nothing ear related.  Are you ok to see this pt.  I relayed that he would be out of office and would be a next available appt, but would check with him prior to making appt.  She was ok with this.

## 2023-08-13 ENCOUNTER — Telehealth: Payer: Self-pay | Admitting: Family Medicine

## 2023-08-13 ENCOUNTER — Other Ambulatory Visit (HOSPITAL_COMMUNITY): Payer: Self-pay

## 2023-08-13 ENCOUNTER — Other Ambulatory Visit: Payer: Self-pay

## 2023-08-13 NOTE — Telephone Encounter (Signed)
Pt scheduled with Dr.Sater on 09/03/23

## 2023-08-13 NOTE — Telephone Encounter (Signed)
Message noted.

## 2023-08-13 NOTE — Telephone Encounter (Signed)
Called to reschedule 11/5 appointment with Amy, pt stated she would like to reschedule with Dr. Epimenio Foot because she is having issues with her migraines. Pt rescheduled with Dr. Epimenio Foot for 09/03/23 at 8:30am

## 2023-08-19 ENCOUNTER — Ambulatory Visit: Payer: Commercial Managed Care - PPO | Admitting: Family Medicine

## 2023-08-19 VITALS — BP 121/74 | HR 84 | Ht 66.0 in | Wt 195.0 lb

## 2023-08-19 DIAGNOSIS — Z7689 Persons encountering health services in other specified circumstances: Secondary | ICD-10-CM

## 2023-08-19 DIAGNOSIS — Z6831 Body mass index (BMI) 31.0-31.9, adult: Secondary | ICD-10-CM

## 2023-08-19 DIAGNOSIS — H8110 Benign paroxysmal vertigo, unspecified ear: Secondary | ICD-10-CM

## 2023-08-19 DIAGNOSIS — Z23 Encounter for immunization: Secondary | ICD-10-CM

## 2023-08-19 MED ORDER — AMBULATORY NON FORMULARY MEDICATION: 0 refills | Status: DC

## 2023-08-19 MED ORDER — AMBULATORY NON FORMULARY MEDICATION: 1 refills | Status: DC

## 2023-08-19 NOTE — Assessment & Plan Note (Signed)
  Visit #: 13 Starting Weight: 230 lbs   Current weight:195 lbs Previous weight: 192 lbs Change in weight:  up 3  lbs  Goal weight: 165-175 lbs Dietary goals: Continue eating at home and doing meal prep. Exercise goals: continue 3 days per week Cardio.  Add in resistance training with bands etc. Medication: start compounded Wegovy at close to 0.25 mg  Can still consider Contrave which should be covered under her current insurance plan for maintenance going forward. Follow-up and referrals: 8 weeks

## 2023-08-19 NOTE — Progress Notes (Signed)
Established Patient Office Visit  Subjective   Patient ID: Lisa Cannon, female    DOB: [birth year]  Age: 42 y.o. MRN: [id number]  Chief Complaint  Patient presents with   Weight Check    HPI  F/U Weight management -she is just really struggled she was unable to take the phentermine it made her feel really dizzy and anxious.  She even split the tab in half and did not like the way it made her feel so she did stop it.  Unfortunately she has gained about 10 pounds back since she being off the [facility].  She can really feel it.  She is still doing cardio 3 days a week.  Not currently doing any resistance training.  She has really noticed that the hunger cravings have really ramped back up and is just made it a lot more challenging.  Having motion sickness with driving and even with head motion at time. Has been going on for several weeiks. Has Neuro appt coming up in about 2 weeks.  Feels like the vertigo is a little worse if she moves her head to the left.    ROS    Objective:     BP 121/74   Pulse 84   Ht 5\' 6"  (1.676 m)   Wt 195 lb (88.5 kg)   SpO2 100%   BMI 31.47 kg/m    Physical Exam Vitals and nursing note reviewed.  Constitutional:      Appearance: Normal appearance.  HENT:     Head: Normocephalic and atraumatic.  Eyes:     Conjunctiva/sclera: Conjunctivae normal.  Cardiovascular:     Rate and Rhythm: Normal rate and regular rhythm.  Pulmonary:     Effort: Pulmonary effort is normal.     Breath sounds: Normal breath sounds.  Skin:    General: Skin is warm and dry.  Neurological:     Mental Status: She is alert.  Psychiatric:        Mood and Affect: Mood normal.      No results found for any visits on 08/19/23.    The 10-year ASCVD risk score (Arnett DK, et al., 2019) is: 0.6%    Assessment & Plan:   Problem List Items Addressed This Visit       Other   Encounter for weight management     Visit #: 13 Starting Weight: 230 lbs   Current  weight:195 lbs Previous weight: 192 lbs Change in weight:  up 3  lbs  Goal weight: 165-175 lbs Dietary goals: Continue eating at home and doing meal prep. Exercise goals: continue 3 days per week Cardio.  Add in resistance training with bands etc. Medication: start compounded Wegovy at close to 0.25 mg  Can still consider Contrave which should be covered under her current insurance plan for maintenance going forward. Follow-up and referrals: 8 weeks      Relevant Medications   AMBULATORY NON FORMULARY MEDICATION   Other Visit Diagnoses     Encounter for immunization    -  Primary   Relevant Orders   Flu vaccine trivalent PF, 6mos and older(Flulaval,Afluria,Fluarix,Fluzone) (Completed)   Benign paroxysmal positional vertigo, unspecified laterality       BMI 31.0-31.9,adult       Relevant Medications   AMBULATORY NON FORMULARY MEDICATION      BPPV-active flexion he has some element of vertigo going on.  Given handout for exercises to do on her own at home.  She does have a follow-up  with her neurologist in a couple of weeks so she will address it then especially if not improved or resolving.  Return in about 2 months (around 10/19/2023) for weight mgt.    Nani Gasser, MD

## 2023-08-20 DIAGNOSIS — D225 Melanocytic nevi of trunk: Secondary | ICD-10-CM | POA: Diagnosis not present

## 2023-08-20 DIAGNOSIS — L814 Other melanin hyperpigmentation: Secondary | ICD-10-CM | POA: Diagnosis not present

## 2023-08-20 DIAGNOSIS — D224 Melanocytic nevi of scalp and neck: Secondary | ICD-10-CM | POA: Diagnosis not present

## 2023-08-20 DIAGNOSIS — D2239 Melanocytic nevi of other parts of face: Secondary | ICD-10-CM | POA: Diagnosis not present

## 2023-08-20 DIAGNOSIS — D485 Neoplasm of uncertain behavior of skin: Secondary | ICD-10-CM | POA: Diagnosis not present

## 2023-08-21 ENCOUNTER — Ambulatory Visit: Payer: 59 | Admitting: Family Medicine

## 2023-08-26 ENCOUNTER — Telehealth: Payer: Self-pay | Admitting: Family Medicine

## 2023-08-26 NOTE — Telephone Encounter (Signed)
Patient called stating [facility] has not received her prescription for Compound Wegovy. Please advise

## 2023-08-27 ENCOUNTER — Other Ambulatory Visit: Payer: Self-pay | Admitting: *Deleted

## 2023-08-27 ENCOUNTER — Ambulatory Visit: Payer: Commercial Managed Care - PPO

## 2023-08-27 DIAGNOSIS — Z6831 Body mass index (BMI) 31.0-31.9, adult: Secondary | ICD-10-CM

## 2023-08-27 DIAGNOSIS — Z01419 Encounter for gynecological examination (general) (routine) without abnormal findings: Secondary | ICD-10-CM

## 2023-08-27 DIAGNOSIS — Z7689 Persons encountering health services in other specified circumstances: Secondary | ICD-10-CM

## 2023-08-27 DIAGNOSIS — Z1231 Encounter for screening mammogram for malignant neoplasm of breast: Secondary | ICD-10-CM | POA: Diagnosis not present

## 2023-08-27 MED ORDER — AMBULATORY NON FORMULARY MEDICATION: 1 refills | Status: DC

## 2023-08-27 NOTE — Telephone Encounter (Signed)
Medication reprinted and faxed. Confirmation received.

## 2023-09-03 ENCOUNTER — Ambulatory Visit: Payer: Commercial Managed Care - PPO | Admitting: Neurology

## 2023-09-03 ENCOUNTER — Encounter: Payer: Self-pay | Admitting: Neurology

## 2023-09-03 ENCOUNTER — Other Ambulatory Visit (HOSPITAL_COMMUNITY): Payer: Self-pay

## 2023-09-03 ENCOUNTER — Other Ambulatory Visit: Payer: Self-pay

## 2023-09-03 VITALS — BP 117/84 | HR 91 | Ht 66.0 in | Wt 195.0 lb

## 2023-09-03 DIAGNOSIS — G43709 Chronic migraine without aura, not intractable, without status migrainosus: Secondary | ICD-10-CM

## 2023-09-03 DIAGNOSIS — G43009 Migraine without aura, not intractable, without status migrainosus: Secondary | ICD-10-CM

## 2023-09-03 MED ORDER — NORTRIPTYLINE HCL 25 MG PO CAPS
25.0000 mg | ORAL_CAPSULE | Freq: Every day | ORAL | 3 refills | Status: DC
  Filled 2023-09-03: qty 90, 90d supply, fill #0

## 2023-09-03 MED ORDER — ATOGEPANT 30 MG PO TABS
30.0000 mg | ORAL_TABLET | Freq: Every day | ORAL | 3 refills | Status: DC
  Filled 2023-09-03: qty 90, 90d supply, fill #0
  Filled 2023-11-28: qty 90, 90d supply, fill #1

## 2023-09-03 NOTE — Progress Notes (Signed)
[facility]  PATIENT: Lisa Cannon DOB: [birth year]  REFERRING DOCTOR OR PCP:  Dr. Linford Arnold SOURCE: patient, notes from PCP, labs  _________________________________   HISTORICAL  CHIEF COMPLAINT:  Chief Complaint  Patient presents with   Follow-up    RM 10. Last seen 08/20/22. Migraine f.u. Feels Qulipta not working well. New sx: dizziness, motion sickness. Has worsened since May. Still feels migraines daily, Bennie Pierini is not having with pain, but does keep the migraines not as intense.     HISTORY OF PRESENT ILLNESS:  Lisa Cannon is a 42 yo woman with chronic migraine headaches.  Update 09/02/2023 She initially did better on Qulipta 30 mg  She now has  3-4 migraine days a week.  Most are mild to moderate.  Dose was increased to 60 mg and there was not much benefit.  When one occurs, she takes an Aleve and/or sumatriptan.     Prior to [place] she had chronic migraine 25/30 days a month > 4 hours a day.    Headaches are left greater than right associated with a throbbing quality and pressure.  There is often nausea but no vomiting.  She has photophobia and phonophobia.  Moving the head intensifies the pain.  Imitrex will sometimes help short-term but the pain comes back  Her neck pain is doing better as well.  She had Vyepti in February 2021and did well x 7-8 weeks. This was better than any medication tried.  However the additional IV infusions (2 more) had not helped.    She had little benefit with the second or third injections though.   Aimovig and Emgality did not have much benefit.     Botox worsened the pain.    She continues to have pain and pressure in her face and head on a daily basis.   She still takes flexeril 5 mg po tid as it had helped her some.     Topiramate has been tried a couple times but she did not get a benefit and also had side effects.  Propranolol, Effexor, amitriptyline, Corgard, have been tried in the past as well with minimal to no  benefit or side effects.   Keppra in the past caused disorientation.     She lost 40 pounds on Wegovy  Migraine History: She is a 42 year old woman who has had migraines since age 78.   Initially, headaches were sporadic but they have become more chronic.     Currently, she has 16-20 migraine days a month for 4 or more hours a day.   She does not have aura.  Migraines are left frontal with a throbbing knifelike quality.   She often has pressure in her eyes and pain in the back of the head.   She sometimes has nausea and no vomiting.    She has photophobia and phonophobia.   During her cycle, HA's are daily and the rest of the month they are intermittent. Moving her head intensifies the pain.    Laying down does not help.     Imitrex will usually help for the rest of the day but then the HA returns.          Medication tried: She was on gabapentin for over a year but had no benefit and actually did better when she stopped.    Topiramate was tried twice with no benefit and some side effects.   A combination of Corgard and Flexeril helped mildly (but only 20-25% reduction in  frequency).   She has tried and failed propranolol.   Effexor or amitriptyline did not help and the tricyclic caused side effects.    Aimovig had not helped (did x 1 week only) .   Botox made HA worse.   Emgality did not help.  Vyepti helped the first course but not 2nd or third course so she stopped   REVIEW OF SYSTEMS: Constitutional: No fevers, chills, sweats, or change in appetite Eyes: No visual changes, double vision, eye pain Ear, nose and throat: No hearing loss, ear pain, nasal congestion, sore throat Cardiovascular: No chest pain, palpitations Respiratory:  No shortness of breath at rest or with exertion.   No wheezes GastrointestinaI: No nausea, vomiting, diarrhea, abdominal pain, fecal incontinence Genitourinary:  No dysuria, urinary retention or frequency.  No nocturia. Musculoskeletal:  No neck pain, back  pain Integumentary: No rash, pruritus, skin lesions Neurological: as above Psychiatric: No depression at this time.  No anxiety Endocrine: No palpitations, diaphoresis, change in appetite, change in weigh or increased thirst Hematologic/Lymphatic:  No anemia, purpura, petechiae. Allergic/Immunologic: Has seasonal allergies  ALLERGIES: No Known Allergies  HOME MEDICATIONS:  Current Outpatient Medications:    AMBULATORY NON FORMULARY MEDICATION, Medication Name:  Semaglutide 2.5 mg/mL with pyridoxine 10 mg/ml.  Inject 0.3 mg / week for 4 weeks, then inject 0.6mg  /week for 4 weeks, then inject 1.2 mg for 4 weeks, Disp: 200 Units, Rfl: 1   Atogepant 30 MG TABS, One po qd, Disp: 90 tablet, Rfl: 3   cyclobenzaprine (FLEXERIL) 5 MG tablet, Take 1 tablet (5 mg) by mouth daily as needed for muscle spasms, neck pain., Disp: 90 tablet, Rfl: 0   fluticasone (FLONASE) 50 MCG/ACT nasal spray, Place into both nostrils as needed for allergies or rhinitis., Disp: , Rfl:    Multiple Vitamin (MULTIVITAMIN) capsule, Take 1 capsule by mouth daily., Disp: , Rfl:    nadolol (CORGARD) 20 MG tablet, Take 3 tablets (60 mg total) by mouth daily., Disp: 270 tablet, Rfl: 3   naproxen sodium (ALEVE) 220 MG tablet, Take 220 mg by mouth daily as needed (for pain)., Disp: , Rfl:    nortriptyline (PAMELOR) 25 MG capsule, Take 1 capsule (25 mg total) by mouth at bedtime., Disp: 90 capsule, Rfl: 3   Probiotic Product (PROBIOTIC PO), Take by mouth., Disp: , Rfl:    SUMAtriptan (IMITREX) 100 MG tablet, TAKE 1 TABLET BY MOUTH AT ONSET OF HEADACHE. MAY REPEAT DOSE AFTER 2 HOURS. DO NOT TAKE MORE THAN 2 DAYS A WEEK., Disp: 27 tablet, Rfl: 3  PAST MEDICAL HISTORY: Past Medical History:  Diagnosis Date   Abnormal Pap smear of cervix    Heartburn    Migraines    Prediabetes 09/03/2021    PAST SURGICAL HISTORY: Past Surgical History:  Procedure Laterality Date   CERVICAL BIOPSY  W/ LOOP ELECTRODE EXCISION  2002    LAPAROSCOPIC BILATERAL SALPINGECTOMY Bilateral 09/12/2015   Procedure: LAPAROSCOPIC BILATERAL SALPINGECTOMY;  Surgeon: Allie Bossier, MD;  Location: [facility];  Service: Gynecology;  Laterality: Bilateral;   LAPAROSCOPIC LYSIS OF ADHESIONS  09/12/2015   Procedure: LAPAROSCOPIC LYSIS OF ADHESIONS;  Surgeon: Allie Bossier, MD;  Location: [facility];  Service: Gynecology;;   TUBAL LIGATION     WISDOM TOOTH EXTRACTION      FAMILY HISTORY: Family History  Problem Relation Age of Onset   Hyperlipidemia Mother    Diabetes Maternal Aunt    Hypertension Maternal Aunt    Hypertension Maternal Grandfather  Cancer Paternal Grandmother        brain   Cancer Paternal Grandfather        lung cancer   Breast cancer Neg Hx     SOCIAL HISTORY:  Social History   Socioeconomic History   Marital status: Married    Spouse name: BJ   Number of children: 1   Years of education: 16   Highest education level: Bachelor's degree (e.g., BA, AB, BS)  Occupational History   Occupation: homemaker  Tobacco Use   Smoking status: Never   Smokeless tobacco: Never  Vaping Use   Vaping status: Never Used  Substance and Sexual Activity   Alcohol use: No    Alcohol/week: 0.0 standard drinks of alcohol   Drug use: No   Sexual activity: Yes    Partners: Male    Birth control/protection: Surgical  Other Topics Concern   Not on file  Social History Narrative   Lives with husband BJ and daughter Valentina Gu    Caffeine use: none   Right handed    Social Determinants of Health   Financial Resource Strain: Low Risk  (03/17/2023)   Overall Financial Resource Strain (CARDIA)    Difficulty of Paying Living Expenses: Not hard at all  Food Insecurity: No Food Insecurity (03/17/2023)   Hunger Vital Sign    Worried About Running Out of Food in the Last Year: Never true    Ran Out of Food in the Last Year: Never true  Transportation Needs: No Transportation Needs (03/17/2023)   PRAPARE - Scientist, research (physical sciences) (Medical): No    Lack of Transportation (Non-Medical): No  Physical Activity: Insufficiently Active (03/17/2023)   Exercise Vital Sign    Days of Exercise per Week: 3 days    Minutes of Exercise per Session: 30 min  Stress: No Stress Concern Present (03/17/2023)   [employer] of Occupational Health - Occupational Stress Questionnaire    Feeling of Stress : Not at all  Social Connections: Socially Integrated (03/17/2023)   Social Connection and Isolation Panel [NHANES]    Frequency of Communication with Friends and Family: More than three times a week    Frequency of Social Gatherings with Friends and Family: Once a week    Attends Religious Services: More than 4 times per year    Active Member of [employer] or Organizations: Yes    Attends Engineer, structural: More than 4 times per year    Marital Status: Married  Catering manager Violence: Not on file     PHYSICAL EXAM  Vitals:   09/03/23 0816  BP: 117/84  Pulse: 91  Weight: 195 lb (88.5 kg)  Height: 5\' 6"  (1.676 m)    Body mass index is 31.47 kg/m.   General: The patient is well-developed and well-nourished and in no acute distress  Neck: Range of motion is normal in the neck.  There is no tenderness in the occiput over the splenius capitis muscles.  Neurologic Exam  Mental status: The patient is alert and oriented x 3 at the time of the examination. The patient has apparent normal recent and remote memory, with an apparently normal attention span and concentration ability.   Speech is normal.  Cranial nerves: Extraocular movements are full.  Facial strength and sensation is normal.  Hearing seems symmetric.  Motor:  Muscle bulk is normal.   Strength is normal in the arms  Sensory:   Intact touch x 4  Coordination: She has good  finger-nose-finger bilaterally.  Gait and station: Station is normal.  Normal gait and tandem      DIAGNOSTIC DATA (LABS, IMAGING, TESTING) - I reviewed patient  records, labs, notes, testing and imaging myself where available.  Lab Results  Component Value Date   WBC 7.2 05/14/2022   HGB 14.8 05/14/2022   HCT 44.6 05/14/2022   MCV 85.9 05/14/2022   PLT 297 05/14/2022      Component Value Date/Time   NA 139 06/16/2023 1359   NA 139 05/29/2020 1434   K 4.1 06/16/2023 1359   CL 103 06/16/2023 1359   CO2 26 06/16/2023 1359   GLUCOSE 73 06/16/2023 1359   GLUCOSE 82 05/18/2012 1328   BUN 9 06/16/2023 1359   BUN 13 05/29/2020 1434   CREATININE 0.62 06/16/2023 1359   CALCIUM 9.5 06/16/2023 1359   PROT 6.9 06/16/2023 1359   PROT 7.6 05/29/2020 1434   ALBUMIN 4.4 05/14/2022 0545   ALBUMIN 4.7 05/29/2020 1434   AST 14 06/16/2023 1359   ALT 22 06/16/2023 1359   ALKPHOS 62 05/14/2022 0545   BILITOT 0.7 06/16/2023 1359   BILITOT 0.3 05/29/2020 1434   GFRNONAA >60 05/14/2022 0545   GFRNONAA 85 05/03/2021 0000   GFRAA 99 05/03/2021 0000   Lab Results  Component Value Date   CHOL 174 06/16/2023   HDL 53 06/16/2023   LDLCALC 103 (H) 06/16/2023   TRIG 90 06/16/2023   CHOLHDL 3.3 06/16/2023   Lab Results  Component Value Date   HGBA1C 5.8 (A) 09/03/2021   Lab Results  Component Value Date   VITAMINB12 557 06/11/2022   Lab Results  Component Value Date   TSH 0.98 06/11/2022       ASSESSMENT AND PLAN  1. Migraine without aura and without status migrainosus, not intractable   2. Chronic migraine w/o aura, not intractable, w/o stat migr         1.   Continue Qulipta 30 mg daily and add nortriptyline    She has tried and failed multiple medications.   If no benefit, sto nortriptyline and consider zonisamide 100 mg in combination if benefit tapers off some 2.   Continue prn Imitrex and NSAID for migraine 3.   Stay active and exercise as tolerated.try to lose more weight. 4..  Return in 12 months, sooner if new or worsening neurologic issues  Alisah Grandberry A. Epimenio Foot, MD, [facility] 09/03/2023, 9:06 AM Certified in Neurology, Clinical  Neurophysiology, Sleep Medicine, Pain Medicine and Neuroimaging  [facility] [address], [address] [place], [place] [address] [phone]

## 2023-09-13 ENCOUNTER — Other Ambulatory Visit: Payer: Self-pay

## 2023-09-15 ENCOUNTER — Other Ambulatory Visit: Payer: Self-pay

## 2023-09-15 ENCOUNTER — Other Ambulatory Visit (HOSPITAL_COMMUNITY): Payer: Self-pay

## 2023-09-15 DIAGNOSIS — G43009 Migraine without aura, not intractable, without status migrainosus: Secondary | ICD-10-CM

## 2023-09-15 MED ORDER — SUMATRIPTAN SUCCINATE 100 MG PO TABS
ORAL_TABLET | ORAL | 3 refills | Status: DC
Start: 2023-09-15 — End: 2024-08-31
  Filled 2023-09-15: qty 27, 90d supply, fill #0
  Filled 2023-12-10 – 2023-12-12 (×2): qty 27, 90d supply, fill #1
  Filled 2024-03-09: qty 27, 90d supply, fill #2
  Filled 2024-06-07: qty 27, 90d supply, fill #3

## 2023-09-16 ENCOUNTER — Other Ambulatory Visit (HOSPITAL_COMMUNITY): Payer: Self-pay

## 2023-10-07 ENCOUNTER — Ambulatory Visit: Payer: 59 | Admitting: Family Medicine

## 2023-10-21 ENCOUNTER — Encounter: Payer: Self-pay | Admitting: Family Medicine

## 2023-10-21 ENCOUNTER — Ambulatory Visit: Payer: Commercial Managed Care - PPO | Admitting: Family Medicine

## 2023-10-21 VITALS — BP 106/61 | HR 87 | Ht 66.0 in | Wt 197.0 lb

## 2023-10-21 DIAGNOSIS — E66811 Obesity, class 1: Secondary | ICD-10-CM | POA: Diagnosis not present

## 2023-10-21 DIAGNOSIS — Z7689 Persons encountering health services in other specified circumstances: Secondary | ICD-10-CM

## 2023-10-21 NOTE — Assessment & Plan Note (Addendum)
Visit #: 14 Starting Weight: 230 lbs   Current weight: 197 lbs  Previous weight: 195 lbs (before restarting medication) Change in weight:  up 2 lbs  Goal weight: 165-175 lbs Dietary goals: Continue eating at home and doing meal prep. Exercise goals: continue 3 days per week Cardio.  Add in resistance training with bands etc.  Medication: compounded Wegovy 0.6 mg. Ok to go up to 1.2 mg Follow-up and referrals: 8 weeks

## 2023-10-21 NOTE — Progress Notes (Signed)
   Established Patient Office Visit  Subjective   Patient ID: Lisa Cannon, female    DOB: August 02, 1981  Age: 42 y.o. MRN: [id number]  Chief Complaint  Patient presents with   Weight Check    HPI  Follow-up for weight management-she is currently on compounded semaglutide.  She is now up to 0.6 mg.  She has a few more weeks on this dose and then will try to move up to the 1.2 mg she has noticed a decrease in appetite and is continuing to work on food choices.  She has been doing really well overall with doing her cardio and resistance training she has been doing some upper body weights.  She was recently started on nortriptyline for her migraines.    ROS    Objective:     BP 106/61   Pulse 87   Ht 5\' 6"  (1.676 m)   Wt 197 lb (89.4 kg)   SpO2 100%   BMI 31.80 kg/m    Physical Exam Vitals and nursing note reviewed.  Constitutional:      Appearance: Normal appearance.  HENT:     Head: Normocephalic and atraumatic.  Eyes:     Conjunctiva/sclera: Conjunctivae normal.  Cardiovascular:     Rate and Rhythm: Normal rate and regular rhythm.  Pulmonary:     Effort: Pulmonary effort is normal.     Breath sounds: Normal breath sounds.  Skin:    General: Skin is warm and dry.  Neurological:     Mental Status: She is alert.  Psychiatric:        Mood and Affect: Mood normal.      No results found for any visits on 10/21/23.    The 10-year ASCVD risk score (Arnett DK, et al., 2019) is: 0.5%    Assessment & Plan:   Problem List Items Addressed This Visit       Other   Obesity, Class I, BMI 30-34.9   Encounter for weight management - Primary    Visit #: 14 Starting Weight: 230 lbs   Current weight: 197 lbs  Previous weight: 195 lbs (before restarting medication) Change in weight:  up 2 lbs  Goal weight: 165-175 lbs Dietary goals: Continue eating at home and doing meal prep. Exercise goals: continue 3 days per week Cardio.  Add in resistance training with  bands etc.  Medication: compounded Wegovy 0.6 mg. Ok to go up to 1.2 mg Follow-up and referrals: 8 weeks       Return in about 2 months (around 12/21/2023) for weight mgt.    Nani Gasser, MD

## 2023-10-31 ENCOUNTER — Other Ambulatory Visit (HOSPITAL_COMMUNITY): Payer: Self-pay

## 2023-11-28 ENCOUNTER — Other Ambulatory Visit: Payer: Self-pay

## 2023-12-10 ENCOUNTER — Other Ambulatory Visit (HOSPITAL_COMMUNITY): Payer: Self-pay

## 2023-12-12 ENCOUNTER — Other Ambulatory Visit: Payer: Self-pay

## 2023-12-23 ENCOUNTER — Ambulatory Visit: Payer: Commercial Managed Care - PPO | Admitting: Family Medicine

## 2023-12-23 ENCOUNTER — Encounter: Payer: Self-pay | Admitting: Family Medicine

## 2023-12-23 VITALS — BP 115/67 | HR 82 | Ht 66.0 in | Wt 204.2 lb

## 2023-12-23 DIAGNOSIS — R058 Other specified cough: Secondary | ICD-10-CM

## 2023-12-23 DIAGNOSIS — Z6831 Body mass index (BMI) 31.0-31.9, adult: Secondary | ICD-10-CM

## 2023-12-23 DIAGNOSIS — Z7689 Persons encountering health services in other specified circumstances: Secondary | ICD-10-CM | POA: Diagnosis not present

## 2023-12-23 MED ORDER — AMBULATORY NON FORMULARY MEDICATION: 1 refills | Status: AC

## 2023-12-23 NOTE — Progress Notes (Signed)
Established Patient Office Visit  Subjective  Patient ID: Lisa Cannon, female    DOB: December 17, 1980  Age: 43 y.o. MRN: [id number]  Chief Complaint  Patient presents with   weight management visit    Patient using Wegovy - at maintenance dose - states that the medication is not working as she has gained weight- denies problems or s/e with medication.     HPI  F/U Wt Management -she struggled some over the holidays.  Her daughter was sick for about 3 weeks and then she has been sick for the last 3 weeks.  She has not been able to exercise as much and has been getting food, go a little bit more often around that time.  She felt like she was gaining a little bit of weight back.  She is actually up 7 pounds from prior.  She is on the compounded semaglutide and up to 1.2 mg dose.  Would like me to listen to her chest today she still has a productive cough even though overall she is feeling better than she was previously.  No fever.    ROS    Objective:     BP 115/67   Pulse 82   Ht 5\' 6"  (1.676 m)   Wt 204 lb 4 oz (92.6 kg)   SpO2 100%   BMI 32.97 kg/m    Physical Exam Vitals and nursing note reviewed.  Constitutional:      Appearance: Normal appearance.  HENT:     Head: Normocephalic and atraumatic.     Right Ear: Tympanic membrane, ear canal and external ear normal. There is no impacted cerumen.     Left Ear: Tympanic membrane, ear canal and external ear normal. There is no impacted cerumen.     Nose: Nose normal.     Mouth/Throat:     Pharynx: Oropharynx is clear.  Eyes:     Conjunctiva/sclera: Conjunctivae normal.  Cardiovascular:     Rate and Rhythm: Normal rate and regular rhythm.  Pulmonary:     Effort: Pulmonary effort is normal.     Breath sounds: Normal breath sounds.  Musculoskeletal:     Cervical back: Neck supple. No tenderness.  Lymphadenopathy:     Cervical: No cervical adenopathy.  Skin:    General: Skin is warm and dry.  Neurological:     Mental  Status: She is alert and oriented to person, place, and time.  Psychiatric:        Mood and Affect: Mood normal.      No results found for any visits on 12/23/23.    The 10-year ASCVD risk score (Arnett DK, et al., 2019) is: 0.5%    Assessment & Plan:   Problem List Items Addressed This Visit       Other   Encounter for weight management - Primary   Visit #: 15 Starting Weight: 230 lbs   Current weight: 204 lb  Previous weight: 197 lbs  Change in weight:  up 7 lbs  Goal weight: 165-175 lbs Dietary goals: Continue eating at home and doing meal prep. Exercise goals: Continue 3 days per week Cardio.  Add in resistance training with bands etc.  Medication:On compounded Wegovy  1.2 mg Follow-up and referrals: 8 weeks      Relevant Medications   AMBULATORY NON FORMULARY MEDICATION   Other Visit Diagnoses       BMI 31.0-31.9,adult       Relevant Medications   AMBULATORY NON FORMULARY MEDICATION  Post-viral cough syndrome           Viral cough-expectant management continue with Mucinex hydration cool-mist humidifier.  If not improving or suddenly gets worse please let us know sooner rather than later.  Return in about 2 months (around 02/20/2024) for weight mgt.    Nani Gasser, MD

## 2023-12-23 NOTE — Assessment & Plan Note (Addendum)
Visit #: 15 Starting Weight: 230 lbs   Current weight: 204 lb  Previous weight: 197 lbs  Change in weight:  up 7 lbs  Goal weight: 165-175 lbs Dietary goals: Continue eating at home and doing meal prep. Exercise goals: Continue 3 days per week Cardio.  Add in resistance training with bands etc.  Medication:On compounded Wegovy  1.2 mg Follow-up and referrals: 8 weeks

## 2024-01-16 IMAGING — CT CT RENAL STONE PROTOCOL
2 of 4 series · 16 of 46 positions shown, 18 images · non-contrast
Comparison: No priors.

CLINICAL DATA: 41-year-old female with history of right-sided flank
pain since 3 a.m.



[Series 2: stone full · axial · 0.77mm/px · z∈[+826,+1266]mm · 13 of 98 slices shown, 15 images]
[im 5/98  soft-tissue]
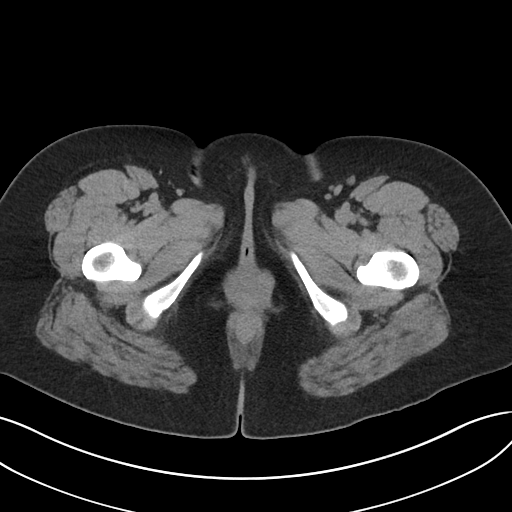
[im 5/98  bone]
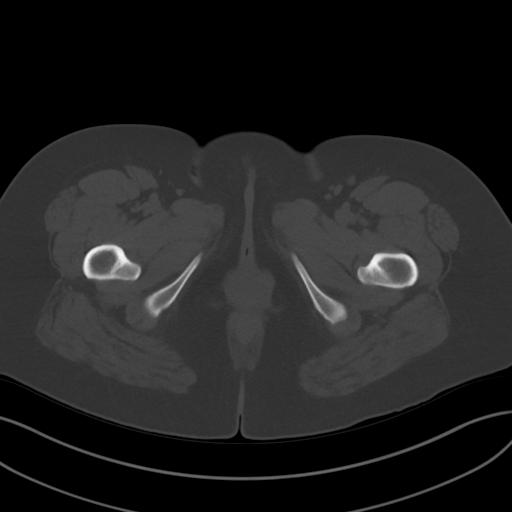
[im 13/98  soft-tissue]
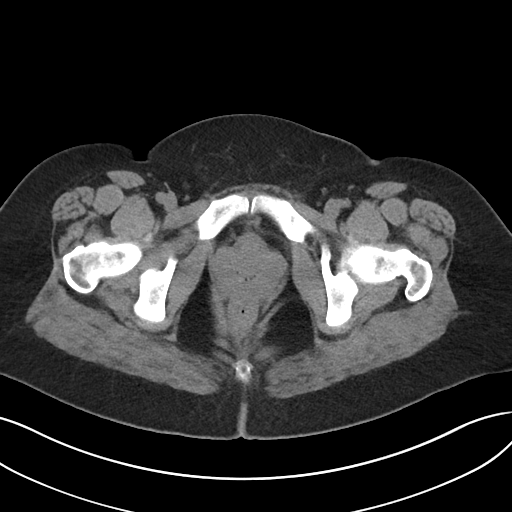
[im 22/98  soft-tissue]
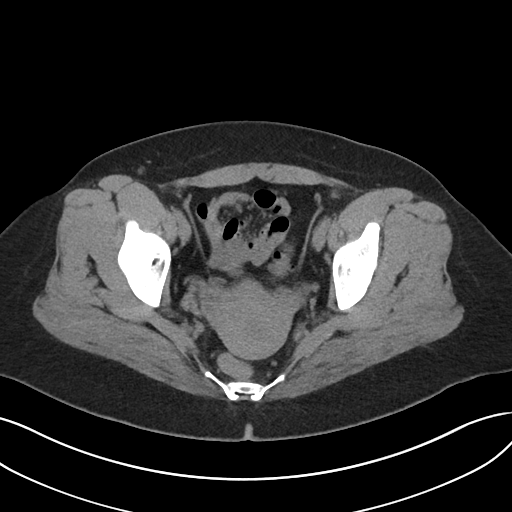
[im 26/98  soft-tissue]
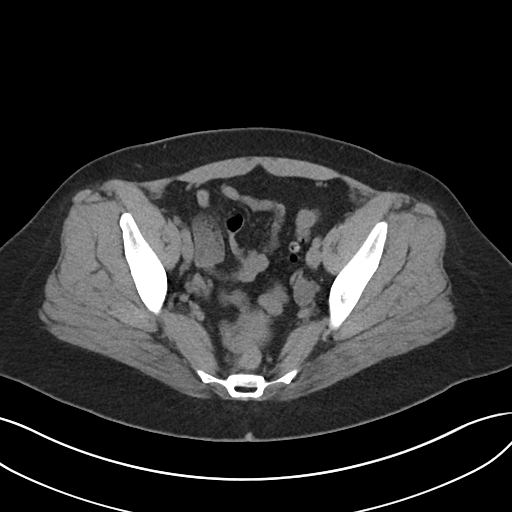
[im 34/98  soft-tissue]
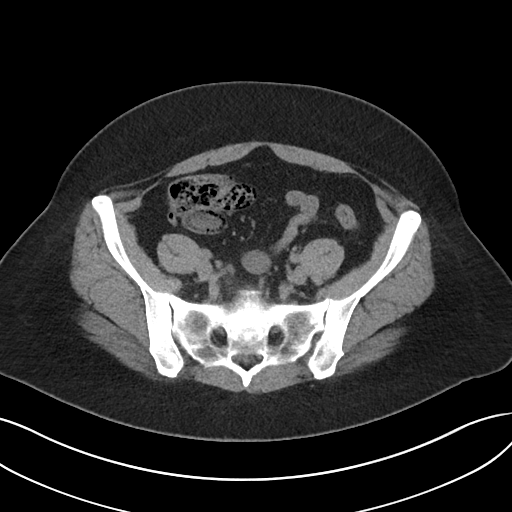
[im 43/98  soft-tissue]
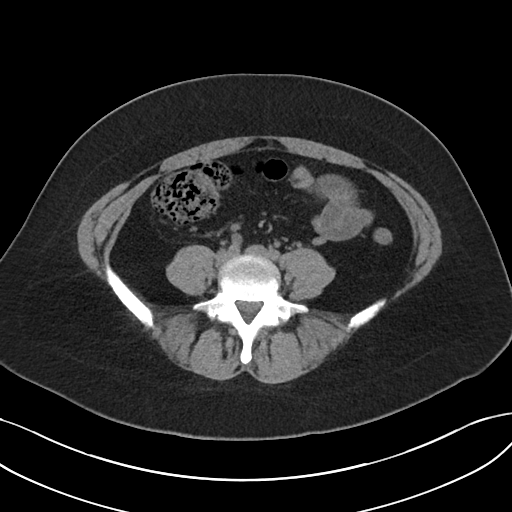
[im 51/98  soft-tissue]
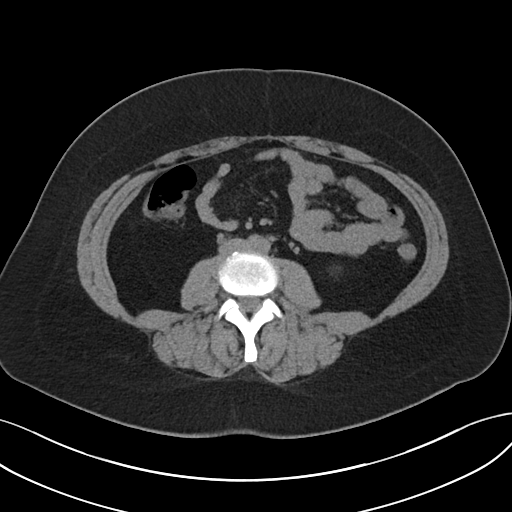
[im 55/98  soft-tissue]
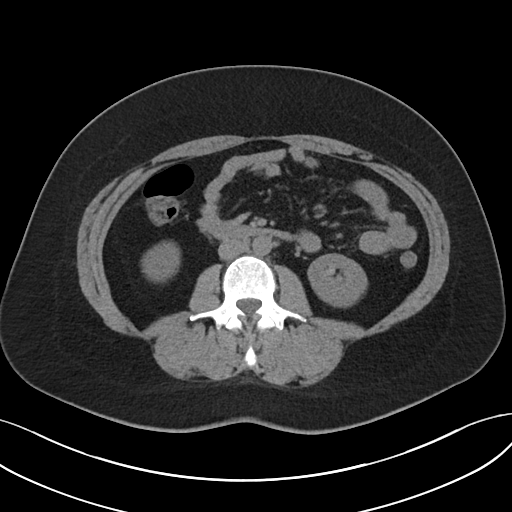
[im 64/98  soft-tissue]
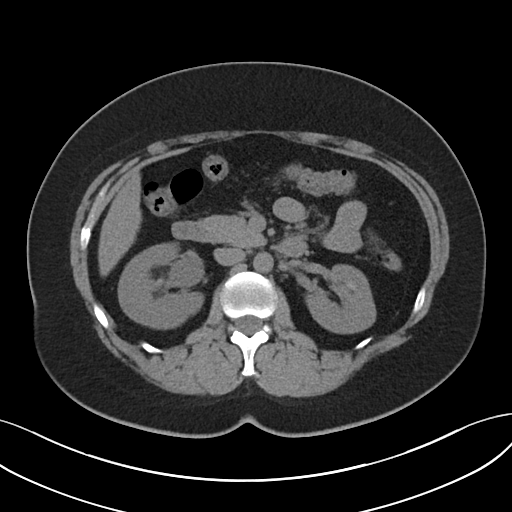
[im 64/98  bone]
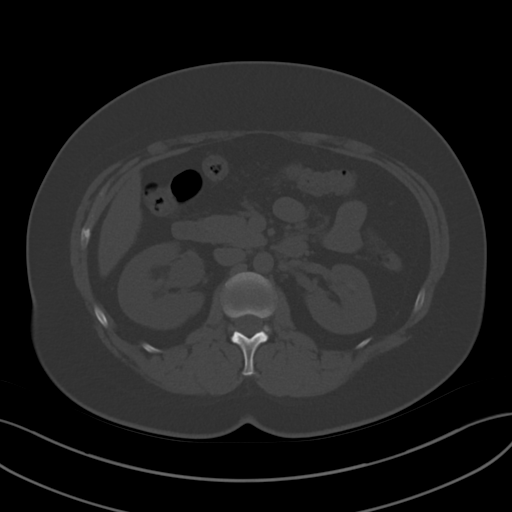
[im 72/98  soft-tissue]
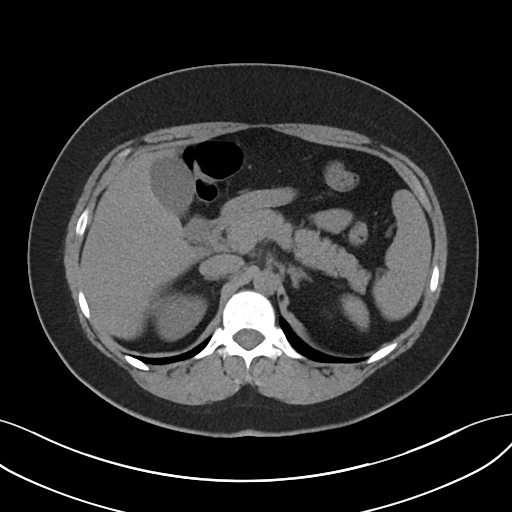
[im 76/98  soft-tissue]
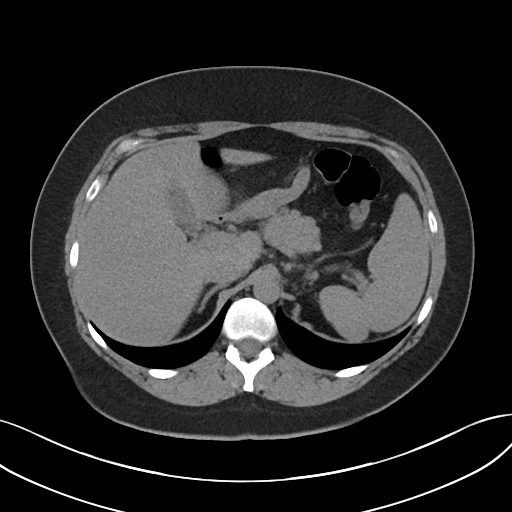
[im 85/98  soft-tissue]
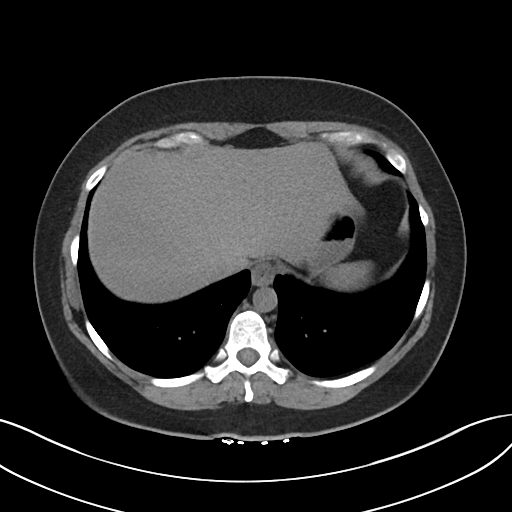
[im 93/98  soft-tissue]
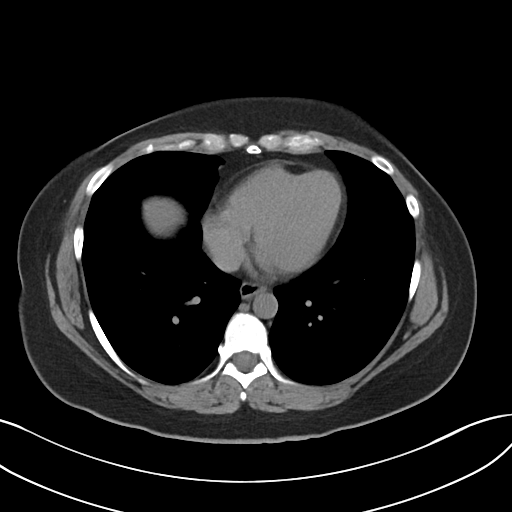

[Series 5: coronal · coronal · 0.88mm/px · 3 of 100 slices shown]
[im 34/100  soft-tissue]
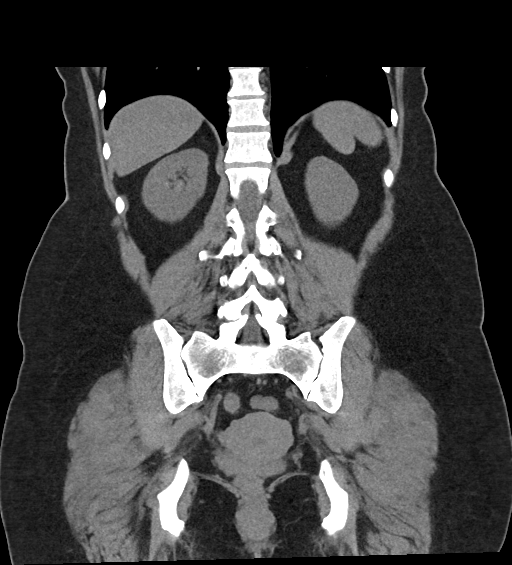
[im 45/100  soft-tissue]
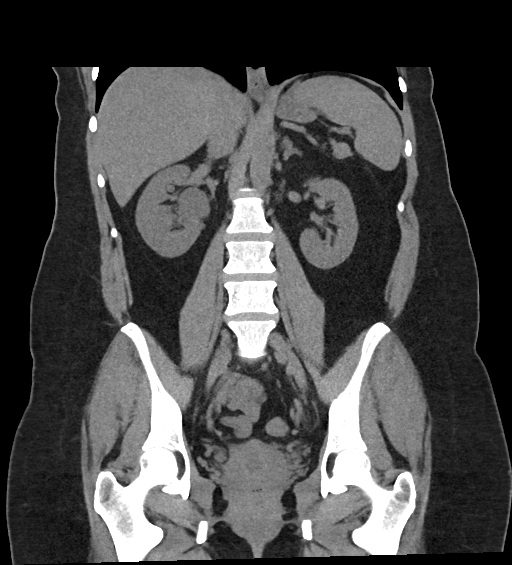
[im 56/100  soft-tissue]
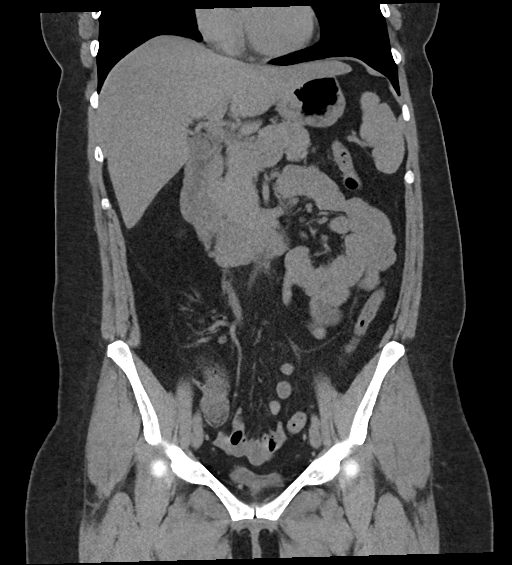

[16 of 46 positions shown; findings below may reference images not displayed]

FINDINGS: Lower chest: Unremarkable.

Hepatobiliary: No suspicious cystic or solid hepatic lesions are
confidently identified on today's noncontrast CT examination.
Unenhanced appearance of the gallbladder is normal.

Pancreas: No definite pancreatic mass or peripancreatic fluid
collections or inflammatory changes are noted on today's noncontrast
CT examination.

Spleen: Unremarkable.

Adrenals/Urinary Tract: At the right ureterovesicular junction
(axial image 82 of series 2) there is a 2 mm calculus. This is
associated with mild proximal right hydroureteronephrosis. No
additional calculi are identified within the collecting system of
either kidney, elsewhere along the course of either ureter, or
within the lumen of the urinary bladder. No left
hydroureteronephrosis. Bilateral kidneys and adrenal glands are
otherwise normal in appearance. Urinary bladder is nearly completely
decompressed, but otherwise unremarkable in appearance.

Stomach/Bowel: Unenhanced appearance of the stomach is normal. No
pathologic dilatation of small bowel or colon. A few scattered
colonic diverticulae are noted, without surrounding inflammatory
changes to suggest an acute diverticulitis at this time. Normal
appendix.

Vascular/Lymphatic: Aortic atherosclerosis. No lymphadenopathy noted
in the abdomen or pelvis.

Reproductive: Uterus and ovaries are unremarkable in appearance.

Other: No significant volume of ascites.  No pneumoperitoneum.

Musculoskeletal: There are no aggressive appearing lytic or blastic
lesions noted in the visualized portions of the skeleton.
IMPRESSION: 1. 2 mm calculus at the right ureterovesicular junction with mild
proximal right hydroureteronephrosis indicating mild right-sided
obstruction at this time.

## 2024-01-28 ENCOUNTER — Other Ambulatory Visit (HOSPITAL_COMMUNITY): Payer: Self-pay

## 2024-01-28 ENCOUNTER — Other Ambulatory Visit: Payer: Self-pay

## 2024-01-29 ENCOUNTER — Other Ambulatory Visit: Payer: Self-pay

## 2024-01-30 ENCOUNTER — Other Ambulatory Visit (HOSPITAL_COMMUNITY): Payer: Self-pay

## 2024-02-02 ENCOUNTER — Other Ambulatory Visit: Payer: Self-pay

## 2024-02-19 ENCOUNTER — Other Ambulatory Visit: Payer: Self-pay

## 2024-02-19 ENCOUNTER — Ambulatory Visit: Payer: Commercial Managed Care - PPO | Admitting: Family Medicine

## 2024-02-19 ENCOUNTER — Other Ambulatory Visit (HOSPITAL_COMMUNITY): Payer: Self-pay

## 2024-02-19 ENCOUNTER — Other Ambulatory Visit: Payer: Self-pay | Admitting: Family Medicine

## 2024-02-19 ENCOUNTER — Encounter: Payer: Self-pay | Admitting: Family Medicine

## 2024-02-19 VITALS — BP 116/75 | HR 76 | Ht 66.0 in | Wt 205.0 lb

## 2024-02-19 DIAGNOSIS — Z713 Dietary counseling and surveillance: Secondary | ICD-10-CM | POA: Diagnosis not present

## 2024-02-19 DIAGNOSIS — Z7689 Persons encountering health services in other specified circumstances: Secondary | ICD-10-CM

## 2024-02-19 MED ORDER — PHENTERMINE HCL 15 MG PO CAPS
15.0000 mg | ORAL_CAPSULE | ORAL | 0 refills | Status: DC
Start: 2024-02-19 — End: 2024-06-21
  Filled 2024-02-19: qty 30, 30d supply, fill #0

## 2024-02-19 MED ORDER — TIRZEPATIDE-WEIGHT MANAGEMENT 2.5 MG/0.5ML ~~LOC~~ SOLN
2.5000 mg | SUBCUTANEOUS | 0 refills | Status: DC
  Filled 2024-02-19: qty 2, 28d supply, fill #0

## 2024-02-19 NOTE — Assessment & Plan Note (Addendum)
 Visit #: 16 Starting Weight: 230 lbs   Current weight: 205 lb  Previous weight: 204 lbs  Change in weight:   up 1 lb  Goal weight: 165-175 lbs Dietary goals: Continue eating at home and doing meal prep. Exercise goals: Continue to work on consistency of exercise and creating a routine.  Add in resistance training with bands etc.  Medication:start Zepbound 2.5mg  if approved. Can start phentermine o/w.  Follow-up and referrals: 8 weeks

## 2024-02-19 NOTE — Progress Notes (Signed)
   Established Patient Office Visit  Subjective  Patient ID: Lisa Cannon, female    DOB: [birth year]  Age: 43 y.o. MRN: [id number]  Chief Complaint  Patient presents with   Weight Check    HPI  Here for 8-week checkup for weight management.  She really felt like the semaglutide was not really helping her lose weight she was just getting some of the nausea but not really the benefit of decreasing her appetite she just felt like it was a struggle so she quit using it.  He is still trying to stick to her diet and eating healthy she just has a hard time being consistent with that she will do well for a week and then either stress or hormones or what ever will really kick in her appetite and then is just much more challenging so than she feels like she loses or gains.  She is kind to stay active and get some exercising but has not been as consistent with the exercise lately.    ROS    Objective:     BP 116/75   Pulse 76   Ht 5\' 6"  (1.676 m)   Wt 205 lb (93 kg)   SpO2 100%   BMI 33.09 kg/m    Physical Exam Vitals and nursing note reviewed.  Constitutional:      Appearance: Normal appearance.  HENT:     Head: Normocephalic and atraumatic.  Eyes:     Conjunctiva/sclera: Conjunctivae normal.  Cardiovascular:     Rate and Rhythm: Normal rate and regular rhythm.  Pulmonary:     Effort: Pulmonary effort is normal.     Breath sounds: Normal breath sounds.  Skin:    General: Skin is warm and dry.  Neurological:     Mental Status: She is alert.  Psychiatric:        Mood and Affect: Mood normal.      No results found for any visits on 02/19/24.    The 10-year ASCVD risk score (Arnett DK, et al., 2019) is: 0.6%    Assessment & Plan:   Problem List Items Addressed This Visit       Other   Encounter for weight management - Primary   Visit #: 16 Starting Weight: 230 lbs   Current weight: 205 lb  Previous weight: 204 lbs  Change in weight:   up 1 lb  Goal weight:  165-175 lbs Dietary goals: Continue eating at home and doing meal prep. Exercise goals: Continue to work on consistency of exercise and creating a routine.  Add in resistance training with bands etc.  Medication:start Zepbound 2.5mg  if approved. Can start phentermine o/w.  Follow-up and referrals: 8 weeks        Relevant Medications   phentermine 15 MG capsule   tirzepatide (ZEPBOUND) 2.5 MG/0.5ML injection vial    No follow-ups on file.    Nani Gasser, MD

## 2024-02-20 ENCOUNTER — Other Ambulatory Visit (HOSPITAL_COMMUNITY): Payer: Self-pay

## 2024-02-20 MED ORDER — ZEPBOUND 2.5 MG/0.5ML ~~LOC~~ SOAJ
2.5000 mg | SUBCUTANEOUS | 0 refills | Status: DC
  Filled 2024-02-20 – 2024-03-09 (×2): qty 2, 28d supply, fill #0

## 2024-02-20 NOTE — Telephone Encounter (Signed)
 Will need a PA.    Meds ordered this encounter  Medications   tirzepatide (ZEPBOUND) 2.5 MG/0.5ML Pen    Sig: Inject 2.5 mg into the skin once a week.    Dispense:  2 mL    Refill:  0

## 2024-02-20 NOTE — Telephone Encounter (Signed)
 Per patient/pharmacy: Please send Rx for pens, not vials. Thanks in advance.

## 2024-02-21 ENCOUNTER — Other Ambulatory Visit (HOSPITAL_COMMUNITY): Payer: Self-pay

## 2024-02-26 ENCOUNTER — Other Ambulatory Visit (HOSPITAL_COMMUNITY): Payer: Self-pay

## 2024-03-01 NOTE — Progress Notes (Unsigned)
   ANNUAL EXAM Patient name: Lisa Cannon MRN [id number]  Date of birth: 1981-11-27 Chief Complaint:   No chief complaint on file.  History of Present Illness:   Lisa Cannon is a 42 y.o. G1P1001 with hx BTL & No LMP recorded. being seen today for a routine annual exam.  Current complaints: ***   History of BTL  Last pap NILM/HPV neg 01/23/23. H/O abnormal pap: yes NILM/HPV+ 12/10/17 Last mammogram: 08/27/23. Results were: normal. Family h/o breast cancer: no Last colonoscopy: n/a. Family h/o colorectal cancer: no HPV vaccine: ***     12/23/2023    9:11 AM 06/16/2023    1:16 PM 01/06/2023    9:27 AM 10/07/2022    9:05 AM 08/07/2022    9:20 AM  Depression screen PHQ 2/9  Decreased Interest 0 0 0 0 0  Down, Depressed, Hopeless 0 0 0 0 0  PHQ - 2 Score 0 0 0 0 0        05/09/2020    8:50 AM 04/02/2018    9:33 AM  GAD 7 : Generalized Anxiety Score  Nervous, Anxious, on Edge 0 0  Control/stop worrying 0 0  Worry too much - different things 0 0  Trouble relaxing 0 0  Restless 0 0  Easily annoyed or irritable 0 0  Afraid - awful might happen 0 0  Total GAD 7 Score 0 0  Anxiety Difficulty Not difficult at all Not difficult at all     Review of Systems:   Pertinent items are noted in HPI Denies any headaches, blurred vision, fatigue, shortness of breath, chest pain, abdominal pain, abnormal vaginal discharge/itching/odor/irritation, problems with periods, bowel movements, urination, or intercourse unless otherwise stated above. Pertinent History Reviewed:  Reviewed past medical,surgical, social and family history.  Reviewed problem list, medications and allergies. Physical Assessment:  There were no vitals filed for this visit.There is no height or weight on file to calculate BMI.        Physical Examination:   General appearance - well appearing, and in no distress  Mental status - alert, oriented to person, place, and time  Chest - respiratory effort normal  Heart - normal  peripheral perfusion  Breasts - breasts appear normal, no suspicious masses, no skin or nipple changes or axillary nodes  Abdomen - soft, nontender, nondistended, no masses or organomegaly  Pelvic - VULVA: normal appearing vulva with no masses, tenderness or lesions  VAGINA: normal appearing vagina with normal color and discharge, no lesions  CERVIX: normal appearing cervix without discharge or lesions, no CMT  UTERUS: uterus is felt to be normal size, shape, consistency and nontender   ADNEXA: No adnexal masses or tenderness noted.  Chaperone present for exam  No results found for this or any previous visit (from the past 24 hours).  Assessment & Plan:  1) Well-Woman Exam Mammogram: Ordered, due 08/2024 Colonoscopy: @ 43yo, or sooner if problems Pap: Due 01/2026 Gardasil: GC/CT: HIV/HCV:  Labs/procedures today: ***  No orders of the defined types were placed in this encounter.   Meds: No orders of the defined types were placed in this encounter.   Follow-up: No follow-ups on file.  Lennart Pall, MD 03/01/2024 11:47 AM

## 2024-03-03 ENCOUNTER — Encounter: Payer: Self-pay | Admitting: Obstetrics and Gynecology

## 2024-03-03 ENCOUNTER — Ambulatory Visit: Admitting: Obstetrics and Gynecology

## 2024-03-03 VITALS — BP 122/80 | HR 77 | Ht 66.0 in | Wt 204.0 lb

## 2024-03-03 DIAGNOSIS — Z01419 Encounter for gynecological examination (general) (routine) without abnormal findings: Secondary | ICD-10-CM | POA: Diagnosis not present

## 2024-03-06 ENCOUNTER — Other Ambulatory Visit (HOSPITAL_COMMUNITY): Payer: Self-pay

## 2024-03-09 ENCOUNTER — Encounter (HOSPITAL_COMMUNITY): Payer: Self-pay

## 2024-03-09 ENCOUNTER — Other Ambulatory Visit (HOSPITAL_COMMUNITY): Payer: Self-pay

## 2024-04-16 ENCOUNTER — Other Ambulatory Visit (HOSPITAL_COMMUNITY): Payer: Self-pay

## 2024-04-27 ENCOUNTER — Other Ambulatory Visit (HOSPITAL_COMMUNITY): Payer: Self-pay

## 2024-04-27 ENCOUNTER — Other Ambulatory Visit: Payer: Self-pay

## 2024-04-28 ENCOUNTER — Other Ambulatory Visit: Payer: Self-pay

## 2024-06-07 ENCOUNTER — Other Ambulatory Visit: Payer: Self-pay

## 2024-06-07 ENCOUNTER — Telehealth (HOSPITAL_COMMUNITY): Payer: Self-pay | Admitting: Pharmacy Technician

## 2024-06-07 ENCOUNTER — Encounter (HOSPITAL_COMMUNITY): Payer: Self-pay

## 2024-06-07 ENCOUNTER — Telehealth (HOSPITAL_COMMUNITY): Payer: Self-pay

## 2024-06-07 ENCOUNTER — Other Ambulatory Visit (HOSPITAL_COMMUNITY): Payer: Self-pay

## 2024-06-07 ENCOUNTER — Encounter: Payer: Self-pay | Admitting: Neurology

## 2024-06-07 ENCOUNTER — Other Ambulatory Visit: Payer: Self-pay | Admitting: *Deleted

## 2024-06-07 DIAGNOSIS — G43009 Migraine without aura, not intractable, without status migrainosus: Secondary | ICD-10-CM

## 2024-06-07 MED ORDER — QULIPTA 30 MG PO TABS
1.0000 | ORAL_TABLET | Freq: Every day | ORAL | 5 refills | Status: DC
  Filled 2024-06-07 (×2): qty 30, 30d supply, fill #0
  Filled 2024-07-03: qty 30, 30d supply, fill #1
  Filled 2024-08-02: qty 30, 30d supply, fill #2
  Filled 2024-08-31: qty 30, 30d supply, fill #3
  Filled 2024-09-28: qty 30, 30d supply, fill #4
  Filled 2024-10-27: qty 30, 30d supply, fill #5

## 2024-06-07 NOTE — Telephone Encounter (Signed)
 PA request has been Received. New Encounter has been or will be created for follow up. For additional info see Pharmacy Prior Auth telephone encounter from 06/07/24.

## 2024-06-07 NOTE — Telephone Encounter (Signed)
 Pharmacy Patient Advocate Encounter   Received notification from Patient Pharmacy that prior authorization for Ubrelvy 30mg  is required/requested.   Insurance verification completed.   The patient is insured through [facility] .   Per test claim: PA required; PA submitted to above mentioned insurance via CoverMyMeds Key/confirmation #/EOC [id number] Status is pending

## 2024-06-07 NOTE — Telephone Encounter (Signed)
 Pharmacy Patient Advocate Encounter  Received notification from [employer] that Prior Authorization for Qulipta  30mg  has been APPROVED from 06/07/24 to 06/07/25. Ran test claim, Copay is $0. This test claim was processed through [facility]- copay amounts may vary at other pharmacies due to pharmacy/plan contracts, or as the patient moves through the different stages of their insurance plan.   PA #/Case ID/Reference #: PA Case ID #: [id number]

## 2024-06-08 ENCOUNTER — Other Ambulatory Visit: Payer: Self-pay

## 2024-06-21 ENCOUNTER — Ambulatory Visit (INDEPENDENT_AMBULATORY_CARE_PROVIDER_SITE_OTHER): Admitting: Family Medicine

## 2024-06-21 ENCOUNTER — Encounter: Payer: Self-pay | Admitting: Family Medicine

## 2024-06-21 VITALS — BP 121/74 | HR 72 | Ht 66.0 in | Wt 215.7 lb

## 2024-06-21 DIAGNOSIS — Z0000 Encounter for general adult medical examination without abnormal findings: Secondary | ICD-10-CM | POA: Diagnosis not present

## 2024-06-21 NOTE — Progress Notes (Addendum)
 Complete physical exam  Patient: Lisa Cannon   DOB: 18-Nov-1981   43 y.o. Female  MRN: [id number]  Subjective:    Chief Complaint  Patient presents with   Annual Exam    Lisa Cannon is a 43 y.o. female who presents today for a complete physical exam. She reports consuming a general diet. Physically active with some exercise.  She generally feels well. She reports sleeping fairly well. She does not have additional problems to discuss today. Her mammo and dermappt are scheduled for the fall.     Having some sxs like weight gain, sleep disruption, periods are heavier, more joint aches and fatigue. Feels like perimenopausal sxs.    Most recent fall risk assessment:    06/21/2024    9:27 AM  Fall Risk   Falls in the past year? 0  Number falls in past yr: 0  Injury with Fall? 0  Risk for fall due to : No Fall Risks  Follow up Falls evaluation completed     Most recent depression screenings:    06/21/2024    9:27 AM 12/23/2023    9:11 AM  PHQ 2/9 Scores  PHQ - 2 Score 0 0         Patient Care Team: Alvan Dorothyann BIRCH, MD as PCP - General (Family Medicine) Starla Harland BROCKS, MD as Consulting Physician (Obstetrics and Gynecology)   Outpatient Medications Prior to Visit  Medication Sig   AMBULATORY NON FORMULARY MEDICATION Medication Name:  Semaglutide  2.5 mg/mL with pyridoxine 10 mg/ml.  Inject 1.2 mg semaglutide  SQ weekly   fluticasone  (FLONASE ) 50 MCG/ACT nasal spray Place into both nostrils as needed for allergies or rhinitis.   Multiple Vitamin (MULTIVITAMIN) capsule Take 1 capsule by mouth daily.   nadolol  (CORGARD ) 20 MG tablet Take 3 tablets (60 mg total) by mouth daily.   naproxen  sodium (ALEVE ) 220 MG tablet Take 220 mg by mouth daily as needed (for pain).   Probiotic Product (PROBIOTIC PO) Take by mouth.   QULIPTA  30 MG TABS Take 1 tablet (30 mg total) by mouth daily.   SUMAtriptan  (IMITREX ) 100 MG tablet TAKE 1 TABLET BY MOUTH AT ONSET OF HEADACHE. MAY  REPEAT DOSE AFTER 2 HOURS. DO NOT TAKE MORE THAN 2 DAYS A WEEK.   [DISCONTINUED] phentermine  15 MG capsule Take 1 capsule (15 mg total) by mouth every morning.   [DISCONTINUED] tirzepatide  (ZEPBOUND ) 2.5 MG/0.5ML Pen Inject 2.5 mg into the skin once a week.   No facility-administered medications prior to visit.    ROS        Objective:     BP 121/74   Pulse 72   Ht 5' 6 (1.676 m)   Wt 215 lb 11.2 oz (97.8 kg)   SpO2 99%   BMI 34.81 kg/m     Physical Exam Constitutional:      Appearance: Normal appearance.  HENT:     Head: Normocephalic and atraumatic.     Right Ear: Tympanic membrane, ear canal and external ear normal.     Left Ear: Tympanic membrane, ear canal and external ear normal.     Nose: Nose normal.     Mouth/Throat:     Pharynx: Oropharynx is clear.  Eyes:     Extraocular Movements: Extraocular movements intact.     Conjunctiva/sclera: Conjunctivae normal.     Pupils: Pupils are equal, round, and reactive to light.  Neck:     Thyroid : No thyromegaly.  Cardiovascular:  Rate and Rhythm: Normal rate and regular rhythm.  Pulmonary:     Effort: Pulmonary effort is normal.     Breath sounds: Normal breath sounds.  Abdominal:     General: Bowel sounds are normal.     Palpations: Abdomen is soft.     Tenderness: There is no abdominal tenderness.  Musculoskeletal:        General: No swelling.     Cervical back: Neck supple.  Skin:    General: Skin is warm and dry.  Neurological:     Mental Status: She is oriented to person, place, and time.  Psychiatric:        Mood and Affect: Mood normal.        Behavior: Behavior normal.      No results found for any visits on 06/21/24.      Assessment & Plan:    Routine Health Maintenance and Physical Exam  Immunization History  Administered Date(s) Administered   Influenza Split 09/17/2011, 08/16/2012   Influenza Whole 09/11/2009   Influenza, Seasonal, Injecte, Preservative Fre 08/19/2023    Influenza,inj,Quad PF,6+ Mos 08/30/2015, 07/22/2017, 08/05/2018, 08/10/2019, 08/07/2022   Influenza-Unspecified 09/22/2014, 08/27/2016, 08/28/2021   Moderna Covid-19 Fall Seasonal Vaccine 39yrs & older 08/29/2023   Moderna Sars-Covid-2 Vaccination 08/28/2022   PFIZER(Purple Top)SARS-COV-2 Vaccination 02/07/2020, 03/09/2020, 08/28/2020, 09/16/2020, 04/20/2021   Pfizer Covid-19 Vaccine Bivalent Booster 5y-11y 08/28/2021   Pfizer(Comirnaty)Fall Seasonal Vaccine 12 years and older 08/27/2022   Td 11/21/2009   Tdap 03/20/2012, 06/11/2022    Health Maintenance  Topic Date Due   Hepatitis B Vaccines (1 of 3 - 19+ 3-dose series) Never done   HPV VACCINES (1 - 3-dose SCDM series) Never done   INFLUENZA VACCINE  07/02/2024   Cervical Cancer Screening (HPV/Pap Cotest)  01/24/2028   DTaP/Tdap/Td (4 - Td or Tdap) 06/11/2032   COVID-19 Vaccine  Completed   Hepatitis C Screening  Completed   HIV Screening  Completed   Meningococcal B Vaccine  Aged Out    Discussed health benefits of physical activity, and encouraged her to engage in regular exercise appropriate for her age and condition.  Problem List Items Addressed This Visit   None Visit Diagnoses       Wellness examination    -  Primary   Relevant Orders   CMP14+EGFR   Lipid Panel With LDL/HDL Ratio   Vitamin D  (25 hydroxy)   TSH       Keep up a regular exercise program and make sure you are eating a healthy diet Try to eat 4 servings of dairy a day, or if you are lactose intolerant take a calcium with vitamin D  daily.  Your vaccines are up to date.  Will check TSH with fatigue and weight gain.   No follow-ups on file.     Dorothyann Byars, MD

## 2024-06-22 ENCOUNTER — Ambulatory Visit: Payer: Self-pay | Admitting: Family Medicine

## 2024-06-22 LAB — CMP14+EGFR
ALT: 19 IU/L (ref 0–32)
AST: 17 IU/L (ref 0–40)
Albumin: 4.3 g/dL (ref 3.9–4.9)
Alkaline Phosphatase: 76 IU/L (ref 44–121)
BUN/Creatinine Ratio: 16 (ref 9–23)
BUN: 11 mg/dL (ref 6–24)
Bilirubin Total: 0.6 mg/dL (ref 0.0–1.2)
CO2: 19 mmol/L — ABNORMAL LOW (ref 20–29)
Calcium: 9.2 mg/dL (ref 8.7–10.2)
Chloride: 103 mmol/L (ref 96–106)
Creatinine, Ser: 0.69 mg/dL (ref 0.57–1.00)
Globulin, Total: 2.7 g/dL (ref 1.5–4.5)
Glucose: 100 mg/dL — ABNORMAL HIGH (ref 70–99)
Potassium: 4.4 mmol/L (ref 3.5–5.2)
Sodium: 137 mmol/L (ref 134–144)
Total Protein: 7 g/dL (ref 6.0–8.5)
eGFR: 111 mL/min/1.73 (ref >59)

## 2024-06-22 LAB — LIPID PANEL WITH LDL/HDL RATIO
Cholesterol, Total: 190 mg/dL (ref 100–199)
HDL: 49 mg/dL (ref >39)
LDL Chol Calc (NIH): 124 mg/dL — ABNORMAL HIGH (ref 0–99)
LDL/HDL Ratio: 2.5 ratio (ref 0.0–3.2)
Triglycerides: 95 mg/dL (ref 0–149)
VLDL Cholesterol Cal: 17 mg/dL (ref 5–40)

## 2024-06-22 LAB — VITAMIN D 25 HYDROXY (VIT D DEFICIENCY, FRACTURES): Vit D, 25-Hydroxy: 24.4 ng/mL — ABNORMAL LOW (ref 30.0–100.0)

## 2024-06-22 LAB — TSH: TSH: 2.56 u[IU]/mL (ref 0.450–4.500)

## 2024-06-22 NOTE — Progress Notes (Signed)
 Hi Lisa Cannon, overall metabolic panel looks good.  LDL cholesterol is up just a little bit continue to work on healthy diet and regular exercise.  Vitamin D  level has dropped a little bit make sure you are till still taking 25 mcg daily.  Thyroid  level looks great at 2.5.

## 2024-07-03 ENCOUNTER — Other Ambulatory Visit (HOSPITAL_COMMUNITY): Payer: Self-pay

## 2024-07-24 ENCOUNTER — Other Ambulatory Visit: Payer: Self-pay

## 2024-07-27 ENCOUNTER — Other Ambulatory Visit (HOSPITAL_COMMUNITY): Payer: Self-pay

## 2024-07-27 ENCOUNTER — Other Ambulatory Visit: Payer: Self-pay

## 2024-07-27 DIAGNOSIS — G43009 Migraine without aura, not intractable, without status migrainosus: Secondary | ICD-10-CM

## 2024-07-27 MED ORDER — NADOLOL 20 MG PO TABS
60.0000 mg | ORAL_TABLET | Freq: Every day | ORAL | 0 refills | Status: DC
  Filled 2024-07-27: qty 270, 90d supply, fill #0

## 2024-08-02 ENCOUNTER — Other Ambulatory Visit: Payer: Self-pay

## 2024-08-03 ENCOUNTER — Encounter: Payer: Self-pay | Admitting: Sports Medicine

## 2024-08-31 ENCOUNTER — Other Ambulatory Visit: Payer: Self-pay

## 2024-08-31 NOTE — Telephone Encounter (Signed)
 Last filled by patient on 06/08/24 Last office visit : 09/15/23 Next office visit : 03/24/25 last sent in by provider : 09/15/23 - Continue prn Imitrex   - refill request has been sent by another CMA and is pending per provider's approval

## 2024-09-01 ENCOUNTER — Other Ambulatory Visit: Payer: Self-pay

## 2024-09-01 ENCOUNTER — Other Ambulatory Visit (HOSPITAL_COMMUNITY): Payer: Self-pay

## 2024-09-02 ENCOUNTER — Ambulatory Visit (INDEPENDENT_AMBULATORY_CARE_PROVIDER_SITE_OTHER)

## 2024-09-02 ENCOUNTER — Ambulatory Visit: Payer: Commercial Managed Care - PPO | Admitting: Family Medicine

## 2024-09-02 DIAGNOSIS — Z1231 Encounter for screening mammogram for malignant neoplasm of breast: Secondary | ICD-10-CM | POA: Diagnosis not present

## 2024-09-02 DIAGNOSIS — Z01419 Encounter for gynecological examination (general) (routine) without abnormal findings: Secondary | ICD-10-CM

## 2024-09-06 ENCOUNTER — Ambulatory Visit: Payer: Self-pay | Admitting: Obstetrics and Gynecology

## 2024-09-13 ENCOUNTER — Other Ambulatory Visit (HOSPITAL_COMMUNITY): Payer: Self-pay

## 2024-09-13 ENCOUNTER — Other Ambulatory Visit: Payer: Self-pay | Admitting: Family Medicine

## 2024-09-13 ENCOUNTER — Encounter (HOSPITAL_COMMUNITY): Payer: Self-pay

## 2024-09-13 DIAGNOSIS — G43009 Migraine without aura, not intractable, without status migrainosus: Secondary | ICD-10-CM

## 2024-09-14 ENCOUNTER — Other Ambulatory Visit (HOSPITAL_COMMUNITY): Payer: Self-pay

## 2024-09-14 MED ORDER — SUMATRIPTAN SUCCINATE 100 MG PO TABS
ORAL_TABLET | ORAL | 1 refills | Status: AC
  Filled 2024-09-14: qty 27, 90d supply, fill #0
  Filled 2024-12-09: qty 27, 90d supply, fill #1

## 2024-09-14 NOTE — Telephone Encounter (Signed)
 Last seen on 09/03/23 Follow up scheduled on 03/24/25

## 2024-09-28 ENCOUNTER — Other Ambulatory Visit: Payer: Self-pay

## 2024-10-22 ENCOUNTER — Other Ambulatory Visit: Payer: Self-pay

## 2024-10-26 ENCOUNTER — Other Ambulatory Visit: Payer: Self-pay

## 2024-10-26 ENCOUNTER — Other Ambulatory Visit (HOSPITAL_COMMUNITY): Payer: Self-pay

## 2024-10-26 DIAGNOSIS — G43009 Migraine without aura, not intractable, without status migrainosus: Secondary | ICD-10-CM

## 2024-10-26 MED ORDER — NADOLOL 20 MG PO TABS
60.0000 mg | ORAL_TABLET | Freq: Every day | ORAL | 0 refills | Status: AC
  Filled 2024-10-26: qty 270, 90d supply, fill #0

## 2024-10-27 ENCOUNTER — Other Ambulatory Visit (HOSPITAL_COMMUNITY): Payer: Self-pay

## 2024-11-29 ENCOUNTER — Other Ambulatory Visit: Payer: Self-pay | Admitting: Neurology

## 2024-11-29 DIAGNOSIS — G43009 Migraine without aura, not intractable, without status migrainosus: Secondary | ICD-10-CM

## 2024-11-30 ENCOUNTER — Other Ambulatory Visit (HOSPITAL_COMMUNITY): Payer: Self-pay

## 2024-11-30 ENCOUNTER — Other Ambulatory Visit: Payer: Self-pay

## 2024-11-30 MED ORDER — QULIPTA 30 MG PO TABS
1.0000 | ORAL_TABLET | Freq: Every day | ORAL | 5 refills | Status: AC
  Filled 2024-11-30: qty 30, 30d supply, fill #0
  Filled 2024-12-28: qty 90, 90d supply, fill #1

## 2024-12-07 ENCOUNTER — Encounter: Payer: Self-pay | Admitting: Family Medicine

## 2024-12-08 MED ORDER — WEGOVY 0.25 MG/0.5ML ~~LOC~~ SOAJ: SUBCUTANEOUS | 0 refills | Status: AC

## 2024-12-09 ENCOUNTER — Other Ambulatory Visit: Payer: Self-pay

## 2024-12-28 ENCOUNTER — Other Ambulatory Visit: Payer: Self-pay

## 2025-02-22 ENCOUNTER — Ambulatory Visit: Admitting: Family Medicine

## 2025-03-24 ENCOUNTER — Ambulatory Visit: Admitting: Neurology
